# Patient Record
Sex: Female | Born: 1955 | Hispanic: Refuse to answer | Marital: Married | Smoking: Never smoker
Health system: Southern US, Community
[De-identification: ages and names within clinical notes are randomized; demographics above are authoritative.]

## PROBLEM LIST (undated history)

## (undated) DIAGNOSIS — R011 Cardiac murmur, unspecified: Secondary | ICD-10-CM

## (undated) DIAGNOSIS — J309 Allergic rhinitis, unspecified: Secondary | ICD-10-CM

## (undated) DIAGNOSIS — E669 Obesity, unspecified: Secondary | ICD-10-CM

## (undated) DIAGNOSIS — I10 Essential (primary) hypertension: Secondary | ICD-10-CM

## (undated) DIAGNOSIS — E785 Hyperlipidemia, unspecified: Secondary | ICD-10-CM

## (undated) DIAGNOSIS — M542 Cervicalgia: Secondary | ICD-10-CM

## (undated) DIAGNOSIS — E66811 Obesity, class 1: Secondary | ICD-10-CM

## (undated) DIAGNOSIS — E119 Type 2 diabetes mellitus without complications: Secondary | ICD-10-CM

## (undated) DIAGNOSIS — M26629 Arthralgia of temporomandibular joint, unspecified side: Secondary | ICD-10-CM

## (undated) DIAGNOSIS — M1990 Unspecified osteoarthritis, unspecified site: Secondary | ICD-10-CM

## (undated) DIAGNOSIS — G8929 Other chronic pain: Secondary | ICD-10-CM

## (undated) DIAGNOSIS — F419 Anxiety disorder, unspecified: Secondary | ICD-10-CM

## (undated) HISTORY — DX: Anxiety disorder, unspecified: F41.9

## (undated) HISTORY — PX: OTHER SURGICAL HISTORY: SHX169

## (undated) HISTORY — DX: Other chronic pain: G89.29

## (undated) HISTORY — DX: Obesity, unspecified: E66.9

## (undated) HISTORY — DX: Unspecified osteoarthritis, unspecified site: M19.90

## (undated) HISTORY — DX: Obesity, class 1: E66.811

## (undated) HISTORY — DX: Hyperlipidemia, unspecified: E78.5

## (undated) HISTORY — DX: Cardiac murmur, unspecified: R01.1

## (undated) HISTORY — DX: Cervicalgia: M54.2

## (undated) HISTORY — DX: Arthralgia of temporomandibular joint, unspecified side: M26.629

## (undated) HISTORY — DX: Type 2 diabetes mellitus without complications: E11.9

## (undated) HISTORY — PX: COLONOSCOPY: SHX174

## (undated) HISTORY — DX: Allergic rhinitis, unspecified: J30.9

---

## 1997-07-18 ENCOUNTER — Ambulatory Visit (HOSPITAL_COMMUNITY): Admission: RE | Admit: 1997-07-18 | Discharge: 1997-07-18 | Payer: Self-pay | Admitting: Infectious Diseases

## 1998-05-11 ENCOUNTER — Ambulatory Visit (HOSPITAL_COMMUNITY): Admission: RE | Admit: 1998-05-11 | Discharge: 1998-05-11 | Payer: Self-pay | Admitting: *Deleted

## 1998-05-13 ENCOUNTER — Encounter: Payer: Self-pay | Admitting: *Deleted

## 1998-08-20 ENCOUNTER — Ambulatory Visit (HOSPITAL_COMMUNITY): Admission: RE | Admit: 1998-08-20 | Discharge: 1998-08-20 | Payer: Self-pay | Admitting: *Deleted

## 1998-08-20 ENCOUNTER — Encounter: Payer: Self-pay | Admitting: *Deleted

## 1999-05-31 ENCOUNTER — Ambulatory Visit (HOSPITAL_COMMUNITY): Admission: RE | Admit: 1999-05-31 | Discharge: 1999-05-31 | Payer: Self-pay | Admitting: Orthopedic Surgery

## 1999-05-31 ENCOUNTER — Encounter: Payer: Self-pay | Admitting: Orthopedic Surgery

## 1999-07-05 ENCOUNTER — Encounter: Payer: Self-pay | Admitting: *Deleted

## 1999-07-05 ENCOUNTER — Ambulatory Visit (HOSPITAL_COMMUNITY): Admission: RE | Admit: 1999-07-05 | Discharge: 1999-07-05 | Payer: Self-pay

## 2000-01-06 ENCOUNTER — Encounter: Admission: RE | Admit: 2000-01-06 | Discharge: 2000-02-09 | Payer: Self-pay | Admitting: Orthopedic Surgery

## 2000-11-06 ENCOUNTER — Ambulatory Visit (HOSPITAL_COMMUNITY): Admission: RE | Admit: 2000-11-06 | Discharge: 2000-11-06 | Payer: Self-pay | Admitting: Diagnostic Radiology

## 2001-05-22 ENCOUNTER — Encounter: Payer: Self-pay | Admitting: Neurosurgery

## 2001-05-22 ENCOUNTER — Ambulatory Visit (HOSPITAL_COMMUNITY): Admission: RE | Admit: 2001-05-22 | Discharge: 2001-05-22 | Payer: Self-pay | Admitting: Neurosurgery

## 2002-01-15 ENCOUNTER — Encounter: Payer: Self-pay | Admitting: Internal Medicine

## 2002-01-15 ENCOUNTER — Encounter: Admission: RE | Admit: 2002-01-15 | Discharge: 2002-01-15 | Payer: Self-pay | Admitting: Internal Medicine

## 2003-02-20 ENCOUNTER — Ambulatory Visit (HOSPITAL_COMMUNITY): Admission: RE | Admit: 2003-02-20 | Discharge: 2003-02-20 | Payer: Self-pay | Admitting: Orthopedic Surgery

## 2003-06-24 ENCOUNTER — Emergency Department (HOSPITAL_COMMUNITY): Admission: EM | Admit: 2003-06-24 | Discharge: 2003-06-24 | Payer: Self-pay | Admitting: Family Medicine

## 2003-07-24 ENCOUNTER — Encounter: Admission: RE | Admit: 2003-07-24 | Discharge: 2003-10-22 | Payer: Self-pay | Admitting: Orthopedic Surgery

## 2003-09-24 ENCOUNTER — Ambulatory Visit (HOSPITAL_COMMUNITY): Admission: RE | Admit: 2003-09-24 | Discharge: 2003-09-24 | Payer: Self-pay | Admitting: Orthopedic Surgery

## 2004-03-26 ENCOUNTER — Ambulatory Visit (HOSPITAL_COMMUNITY): Admission: RE | Admit: 2004-03-26 | Discharge: 2004-03-26 | Payer: Self-pay | Admitting: Allergy

## 2004-06-02 ENCOUNTER — Ambulatory Visit (HOSPITAL_COMMUNITY): Admission: RE | Admit: 2004-06-02 | Discharge: 2004-06-02 | Payer: Self-pay | Admitting: Internal Medicine

## 2004-12-03 ENCOUNTER — Ambulatory Visit (HOSPITAL_COMMUNITY): Admission: RE | Admit: 2004-12-03 | Discharge: 2004-12-03 | Payer: Self-pay | Admitting: Interventional Radiology

## 2005-01-28 ENCOUNTER — Ambulatory Visit (HOSPITAL_COMMUNITY): Admission: RE | Admit: 2005-01-28 | Discharge: 2005-01-28 | Payer: Self-pay | Admitting: Cardiovascular Disease

## 2005-03-30 ENCOUNTER — Ambulatory Visit (HOSPITAL_COMMUNITY): Admission: RE | Admit: 2005-03-30 | Discharge: 2005-03-30 | Payer: Self-pay | Admitting: Orthopedic Surgery

## 2005-07-11 ENCOUNTER — Emergency Department (HOSPITAL_COMMUNITY): Admission: EM | Admit: 2005-07-11 | Discharge: 2005-07-11 | Payer: Self-pay | Admitting: Family Medicine

## 2006-01-26 ENCOUNTER — Ambulatory Visit (HOSPITAL_COMMUNITY): Admission: RE | Admit: 2006-01-26 | Discharge: 2006-01-26 | Payer: Self-pay | Admitting: Cardiovascular Disease

## 2006-08-16 ENCOUNTER — Ambulatory Visit (HOSPITAL_COMMUNITY): Admission: RE | Admit: 2006-08-16 | Discharge: 2006-08-16 | Payer: Self-pay | Admitting: Internal Medicine

## 2006-10-12 ENCOUNTER — Ambulatory Visit (HOSPITAL_COMMUNITY): Admission: RE | Admit: 2006-10-12 | Discharge: 2006-10-12 | Payer: Self-pay | Admitting: Internal Medicine

## 2009-04-10 ENCOUNTER — Ambulatory Visit (HOSPITAL_COMMUNITY): Admission: RE | Admit: 2009-04-10 | Discharge: 2009-04-10 | Payer: Self-pay | Admitting: Internal Medicine

## 2012-08-13 ENCOUNTER — Other Ambulatory Visit (HOSPITAL_COMMUNITY): Payer: Self-pay | Admitting: Internal Medicine

## 2012-08-13 DIAGNOSIS — Z1231 Encounter for screening mammogram for malignant neoplasm of breast: Secondary | ICD-10-CM

## 2012-08-14 ENCOUNTER — Ambulatory Visit (HOSPITAL_COMMUNITY)
Admission: RE | Admit: 2012-08-14 | Discharge: 2012-08-14 | Disposition: A | Discharge status: Home / Self Care | Payer: 59 | Source: Ambulatory Visit | Attending: Internal Medicine | Admitting: Internal Medicine

## 2012-08-14 DIAGNOSIS — Z1231 Encounter for screening mammogram for malignant neoplasm of breast: Secondary | ICD-10-CM | POA: Insufficient documentation

## 2012-11-14 ENCOUNTER — Encounter (HOSPITAL_COMMUNITY): Payer: Self-pay | Admitting: Emergency Medicine

## 2012-11-14 ENCOUNTER — Emergency Department (HOSPITAL_COMMUNITY)
Admission: EM | Admit: 2012-11-14 | Discharge: 2012-11-14 | Disposition: A | Discharge status: Home / Self Care | Payer: Worker's Compensation | Attending: Emergency Medicine | Admitting: Emergency Medicine

## 2012-11-14 DIAGNOSIS — Z79899 Other long term (current) drug therapy: Secondary | ICD-10-CM | POA: Insufficient documentation

## 2012-11-14 DIAGNOSIS — Y9389 Activity, other specified: Secondary | ICD-10-CM | POA: Insufficient documentation

## 2012-11-14 DIAGNOSIS — I10 Essential (primary) hypertension: Secondary | ICD-10-CM | POA: Insufficient documentation

## 2012-11-14 DIAGNOSIS — Y929 Unspecified place or not applicable: Secondary | ICD-10-CM | POA: Insufficient documentation

## 2012-11-14 DIAGNOSIS — S90569A Insect bite (nonvenomous), unspecified ankle, initial encounter: Secondary | ICD-10-CM | POA: Insufficient documentation

## 2012-11-14 DIAGNOSIS — S90562A Insect bite (nonvenomous), left ankle, initial encounter: Secondary | ICD-10-CM

## 2012-11-14 DIAGNOSIS — W57XXXA Bitten or stung by nonvenomous insect and other nonvenomous arthropods, initial encounter: Secondary | ICD-10-CM

## 2012-11-14 HISTORY — DX: Essential (primary) hypertension: I10

## 2012-11-14 NOTE — ED Provider Notes (Signed)
This chart was scribed for Cornelia Copa, a non-physician practitioner working with Junius Argyle, MD by Lewanda Rife, ED Scribe. This patient was seen in room WTR5/WTR5 and the patient's care was started at 2326.    CSN: [id number]     Arrival date & time 11/14/12  2135 History   First MD Initiated Contact with Patient 11/14/12 2228     Chief Complaint  Patient presents with  . Insect Bite    The history is provided by the patient.   HPI Comments: Dana Mcmahon is a 57 y.o. female who presents to the Emergency Department complaining of possible insect bite over left medial ankle onset yesterday 1530. Describes pain as improving and mildly pruritic. 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 Reports associated moderate pain localized to site and resolved swelling. Denies associated numbness, poison oak or poison ivy exposure, fever, chills, redness, swelling, and abdominal pain. Denies any aggravating factors. Reports symptoms are relieved with leg elevation. Reports trying hand sanitizer with mild relief of symptoms.     Past Medical History  Diagnosis Date  . Hypertension    History reviewed. No pertinent past surgical history. No family history on file. History  Substance Use Topics  . Smoking status: Never Smoker   . Smokeless tobacco: Not on file  . Alcohol Use: No   OB History   Grav Para Term Preterm Abortions TAB SAB Ect Mult Living                 Review of Systems  Constitutional: Negative for fever.   A complete 10 system review of systems was obtained and all systems are negative except as noted in the HPI and PMH.    Allergies  Review of patient's allergies indicates no known allergies.  Home Medications   Current Outpatient Rx  Name  Route  Sig  Dispense  Refill  . levocetirizine (XYZAL) 5 MG tablet   Oral   Take 5 mg by mouth every evening.         Marland Kitchen lisinopril (PRINIVIL,ZESTRIL) 5 MG tablet   Oral   Take 5 mg by mouth daily.          . Multiple Vitamin (MULTIVITAMIN WITH MINERALS) TABS tablet   Oral   Take 1 tablet by mouth daily.          BP 161/90  Pulse 75  Temp(Src) 98.9 F (37.2 C) (Oral)  Resp 15  Ht 5\' 4"  (1.626 m)  Wt 175 lb (79.379 kg)  BMI 30.02 kg/m2  SpO2 98% Physical Exam  Nursing note and vitals reviewed. Constitutional: She is oriented to person, place, and time. She appears well-developed and well-nourished. No distress.  HENT:  Head: Normocephalic and atraumatic.  Eyes: Conjunctivae and EOM are normal.  Neck: Neck supple. No tracheal deviation present.  Cardiovascular: Normal rate and regular rhythm.   No murmur heard. Pulmonary/Chest: Effort normal and breath sounds normal. No respiratory distress. She has no wheezes.  Abdominal: Soft.  Musculoskeletal: Normal range of motion.  Neurological: She is alert and oriented to person, place, and time.  Skin: Skin is warm, dry and intact. Rash noted. Rash is vesicular.  1 small bullae lesion. No surrounding erythema, or induration. No other similar lesions noted.   Psychiatric: She has a normal mood and affect. Her behavior is normal.    ED Course  Procedures       MDM   1. Insect bite of ankle, left, initial  encounter    Patient seen and evaluated. She reports acute onset pinching sensation to the medial aspect of the left ankle and foot area. She did develop one single small bulla. She also reports having larger area of redness and mild swelling earlier today which has improved. The area continues to be pruritic. History is consistent with a possible insect bite. There are no other lesions to suggest shingles. No signs for a secondary cellulitis.    I personally performed the services described in this documentation, which was scribed in my presence. The recorded information has been reviewed and is accurate.   Angus Seller, PA-C 11/15/12 0011

## 2012-11-14 NOTE — ED Notes (Signed)
Pt c/o swelling, itching to L ankle. Pt states she was bitten by unkn insect yesterday while working at [employer], small blister noted.

## 2012-11-15 NOTE — ED Provider Notes (Signed)
Medical screening examination/treatment/procedure(s) were performed by non-physician practitioner and as supervising physician I was immediately available for consultation/collaboration.   Junius Argyle, MD 11/15/12 1118

## 2013-05-10 ENCOUNTER — Ambulatory Visit (INDEPENDENT_AMBULATORY_CARE_PROVIDER_SITE_OTHER): Payer: Federal, State, Local not specified - PPO | Admitting: Family Medicine

## 2013-05-10 ENCOUNTER — Encounter: Payer: Self-pay | Admitting: Family Medicine

## 2013-05-10 VITALS — BP 145/85 | HR 68 | Temp 98.1°F | Resp 18 | Ht 63.0 in | Wt 177.0 lb

## 2013-05-10 DIAGNOSIS — E119 Type 2 diabetes mellitus without complications: Secondary | ICD-10-CM | POA: Insufficient documentation

## 2013-05-10 DIAGNOSIS — E1165 Type 2 diabetes mellitus with hyperglycemia: Principal | ICD-10-CM

## 2013-05-10 DIAGNOSIS — R002 Palpitations: Secondary | ICD-10-CM | POA: Insufficient documentation

## 2013-05-10 DIAGNOSIS — E1159 Type 2 diabetes mellitus with other circulatory complications: Secondary | ICD-10-CM | POA: Insufficient documentation

## 2013-05-10 DIAGNOSIS — I152 Hypertension secondary to endocrine disorders: Secondary | ICD-10-CM | POA: Insufficient documentation

## 2013-05-10 DIAGNOSIS — I10 Essential (primary) hypertension: Secondary | ICD-10-CM | POA: Insufficient documentation

## 2013-05-10 DIAGNOSIS — IMO0001 Reserved for inherently not codable concepts without codable children: Secondary | ICD-10-CM

## 2013-05-10 MED ORDER — ASPIRIN EC 81 MG PO TBEC: 81.0000 mg | DELAYED_RELEASE_TABLET | Freq: Every day | ORAL | Status: DC

## 2013-05-10 NOTE — Assessment & Plan Note (Signed)
Pt resistant to meds, esp since bad experience with metformin recently. At this time she prefers to resume her regular exercise, continue diabetic diet, did teaching on home glucometer use, she'll call with her insurances info about preferred glucometer/supplies and we'll rx what is appropriate.  Check fasting glucose daily and bring numbers for review in 2wks. Start daily ASA 81mg  qd.

## 2013-05-10 NOTE — Assessment & Plan Note (Signed)
The current medical regimen is effective;  continue present plan and medications.  

## 2013-05-10 NOTE — Assessment & Plan Note (Signed)
W/u neg for dysrhythmia in the past per pt report. Will obtain old records. Reassured pt of likely benign nature of these, likely related to anxiety. Recommended she continue to minimize caffeine intake and avoid use of OTC decongestants.

## 2013-05-10 NOTE — Progress Notes (Signed)
Pre visit review using our clinic review tool, if applicable. No additional management support is needed unless otherwise documented below in the visit note. 

## 2013-05-10 NOTE — Progress Notes (Signed)
Office Note 05/10/2013  CC:  Chief Complaint  Patient presents with  . Establish Care    transfer from Dr. Karlton Lemon    HPI:  Dana Mcmahon is a 58 y.o. Asian female who is here to establish care. Patient's most recent primary MD: Dr. Karlton Lemon ([facility] in [place]). Old records in EPIC/[facility] EMR were reviewed prior to or during today's visit.  Dx'd with DM approx 2012.  Pt intially controlled it with diet but says last A1c was up and MD started metformin. Started metformin last month, stopped this med about 2 wks ago b/c stomach ache, cognitive changes, some muscle pains.  All sx's resolved with cessation of this med. She does not have any way to monitor her glucoses at home.  She describes herself as chronic worrier, stressed with her work. Describes several year history of brief palpitations that cause her SOB and severe anxiety, last about 1 min, no known trigger (occur at rest only), without CP/diaphoresis/dizziness.  She reports having had a normal EKG and normal holter monitor testing approx 2-3 yrs ago for these sx's.  Says the episodes occur about 1-2 times per month.  Past Medical History  Diagnosis Date  . Hypertension   . Allergic rhinitis   . 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 Neck pain, chronic     Mild DDD, no signif progression (multiple MRI's: 2001, 2003, 2005, 2007)  . DM type 2 (diabetes mellitus, type 2)     History reviewed. No pertinent past surgical history.  Family History  Problem Relation Age of Onset  . Stroke Mother     and father  . Hypertension Mother     and father    History   Social History  . Marital Status: Married    Spouse Name: N/A    Number of Children: N/A  . Years of Education: N/A   Occupational History  . Not on file.   Social History Main Topics  . Smoking status: Never Smoker   . Smokeless tobacco: Not on file  . Alcohol Use: No  . Drug Use: No  . Sexual Activity: Not on file   Other Topics Concern  . Not on file    Social History Narrative   Married, 4 children.   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   No T/A/Ds.                Outpatient Encounter Prescriptions as of 05/10/2013  Medication Sig  . fluticasone (FLONASE) 50 MCG/ACT nasal spray Place 2 sprays into both nostrils daily.  Marland Kitchen lisinopril (PRINIVIL,ZESTRIL) 5 MG tablet Take 5 mg by mouth daily.  . Multiple Vitamin (MULTIVITAMIN WITH MINERALS) TABS tablet Take 1 tablet by mouth daily.  Marland Kitchen aspirin EC 81 MG tablet Take 1 tablet (81 mg total) by mouth daily.  Marland Kitchen levocetirizine (XYZAL) 5 MG tablet Take 5 mg by mouth every evening.  Not taking levocetirizine currently Takes flonase daily  No Known Allergies  ROS See above PE; Blood pressure 145/85, pulse 68, temperature 98.1 F (36.7 C), temperature source Temporal, resp. rate 18, height 5\' 3"  (1.6 m), weight 177 lb (80.287 kg), SpO2 95.00%. Gen: Alert, well appearing, overweight-appearing.  Patient is oriented to person, place, time, and situation. HYQ:MVHQ: no injection, icteris, swelling, or exudate.  EOMI, PERRLA. Mouth: lips without lesion/swelling.  Oral mucosa pink and moist. Oropharynx without erythema, exudate, or swelling.  Neck - No masses or thyromegaly or limitation  in range of motion CV: RRR, no m/r/g.   LUNGS: CTA bilat, nonlabored resps, good aeration in all lung fields. EXT: no clubbing, cyanosis, or edema.   Pertinent labs:  CBG today: 136 (fasting)  ASSESSMENT AND PLAN:   New pt; obtain old records.  Type II or unspecified type diabetes mellitus without mention of complication, uncontrolled Pt resistant to meds, esp since bad experience with metformin recently. At this time she prefers to resume her regular exercise, continue diabetic diet, did teaching on home glucometer use, she'll call with her insurances info about preferred glucometer/supplies and we'll rx what is appropriate.  Check fasting glucose daily and bring numbers for review in  2wks. Start daily ASA 81mg  qd.   Palpitations W/u neg for dysrhythmia in the past per pt report. Will obtain old records. Reassured pt of likely benign nature of these, likely related to anxiety. Recommended she continue to minimize caffeine intake and avoid use of OTC decongestants.  HTN (hypertension), benign The current medical regimen is effective;  continue present plan and medications.   An After Visit Summary was printed and given to the patient.  Return in about 2 weeks (around 05/24/2013) for f/u DM 2 and discuss chronic anxiety.

## 2013-05-10 NOTE — Patient Instructions (Signed)
Start 81mg  Aspirin tab once daily. Call your insurer and ask what brand of glucometer and glucose test strips they prefer. Call our office with this information and I'll send rx for the glucometer and testing supplies to your pharmacy. Check your glucose once every morning before eating or drinking anything--write number down and bring list in for review at next office visit in approximately 2 wks.

## 2013-05-13 ENCOUNTER — Telehealth: Payer: Self-pay | Admitting: Family Medicine

## 2013-05-13 NOTE — Telephone Encounter (Signed)
Relevant patient education assigned to patient using Emmi. ° °

## 2013-05-20 ENCOUNTER — Encounter: Payer: Self-pay | Admitting: Family Medicine

## 2013-05-21 ENCOUNTER — Telehealth: Payer: Self-pay

## 2013-05-21 NOTE — Telephone Encounter (Signed)
Relevant patient education assigned to patient using Emmi. ° °

## 2013-05-24 ENCOUNTER — Ambulatory Visit: Payer: Federal, State, Local not specified - PPO | Admitting: Family Medicine

## 2013-05-31 ENCOUNTER — Encounter: Payer: Self-pay | Admitting: Family Medicine

## 2013-05-31 ENCOUNTER — Ambulatory Visit (INDEPENDENT_AMBULATORY_CARE_PROVIDER_SITE_OTHER): Payer: Federal, State, Local not specified - PPO | Admitting: Family Medicine

## 2013-05-31 VITALS — BP 143/85 | HR 77 | Temp 97.7°F | Resp 18 | Ht 63.0 in | Wt 180.0 lb

## 2013-05-31 DIAGNOSIS — Z566 Other physical and mental strain related to work: Secondary | ICD-10-CM

## 2013-05-31 DIAGNOSIS — R079 Chest pain, unspecified: Secondary | ICD-10-CM

## 2013-05-31 DIAGNOSIS — Z569 Unspecified problems related to employment: Secondary | ICD-10-CM

## 2013-05-31 DIAGNOSIS — I10 Essential (primary) hypertension: Secondary | ICD-10-CM

## 2013-05-31 DIAGNOSIS — M79609 Pain in unspecified limb: Secondary | ICD-10-CM

## 2013-05-31 DIAGNOSIS — E119 Type 2 diabetes mellitus without complications: Secondary | ICD-10-CM

## 2013-05-31 DIAGNOSIS — E785 Hyperlipidemia, unspecified: Secondary | ICD-10-CM

## 2013-05-31 DIAGNOSIS — M79646 Pain in unspecified finger(s): Secondary | ICD-10-CM

## 2013-05-31 LAB — BASIC METABOLIC PANEL
BUN: 17 mg/dL (ref 6–23)
CO2: 30 mEq/L (ref 19–32)
Calcium: 9.4 mg/dL (ref 8.4–10.5)
Chloride: 99 mEq/L (ref 96–112)
Creatinine, Ser: 0.6 mg/dL (ref 0.4–1.2)
GFR: 106.93 mL/min (ref >60.00)
Glucose, Bld: 149 mg/dL — ABNORMAL HIGH (ref 70–99)
Potassium: 3.6 mEq/L (ref 3.5–5.1)
Sodium: 136 mEq/L (ref 135–145)

## 2013-05-31 LAB — HEMOGLOBIN A1C: Hgb A1c MFr Bld: 7.7 % — ABNORMAL HIGH (ref 4.6–6.5)

## 2013-05-31 NOTE — Progress Notes (Signed)
OFFICE NOTE  05/31/2013  CC:  Chief Complaint  Patient presents with  . Follow-up    2 weeks     HPI: Patient is a 58 y.o. Hispanic female who is here for 2 wk f/u: dm 2. No home glucoses to report; just got her glucometer yesterday.  Also wants to discuss stress: mainly due to her work, feels overwhelmed with having to do so much.  Minimizes and other worries in her life.  Does not feel like this is a big issue she needs to deal with.  Denies depressed mood.  Still c/o intermittent (every few days at the most lately), central and right sided chest soreness that comes on as a slow squeezing sensation, always occurs at rest and always goes away w/in a few minutes with some slow/deep breaths and with "getting my mind clear".  No diaphoresis, nausea, or palpitations with this.  No exertional sx's. Sometimes the pain seems to wax and wane throughout her day and she takes ibup or tylenol.   Pertinent PMH:  Past medical, surgical, social, and family history reviewed and no changes are noted since last office visit.  MEDS:  Outpatient Prescriptions Prior to Visit  Medication Sig Dispense Refill  . aspirin EC 81 MG tablet Take 1 tablet (81 mg total) by mouth daily.  30 tablet  0  . fluticasone (FLONASE) 50 MCG/ACT nasal spray Place 2 sprays into both nostrils daily.      Marland Kitchen lisinopril (PRINIVIL,ZESTRIL) 5 MG tablet Take 5 mg by mouth daily.      . Multiple Vitamin (MULTIVITAMIN WITH MINERALS) TABS tablet Take 1 tablet by mouth daily.      Marland Kitchen levocetirizine (XYZAL) 5 MG tablet Take 5 mg by mouth every evening.       No facility-administered medications prior to visit.    PE: Blood pressure 143/85, pulse 77, temperature 97.7 F (36.5 C), temperature source Oral, resp. rate 18, height 5\' 3"  (1.6 m), weight 180 lb (81.647 kg), SpO2 94.00%. CV: RRR, no m/r/g.   LUNGS: CTA bilat, nonlabored resps, good aeration in all lung fields. Right upper chest wall and central chest wall tenderness to  palpation--mild.   IMPRESSION AND PLAN:  1) DM 2, due for HbA1c check today. Will discuss possible retry of oral med after results are in. She has never seen a nutritionist: referral made today.  2) HTN: The current medical regimen is effective;  continue present plan and medications. Cr/lytes today.  3) Chest wall, non-cardiac chest pain.  Possibly exacerbated by work-stress. Reassured pt.  May take ibuprofen or tylenol prn. Signs/symptoms to call or return for were reviewed and pt expressed understanding. EKGs in the past normal.  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  Letter requested by pt to allow for ergonomically correct work equipment--letter written today.  5) Hyperlipidemia (most recent labs 01/2013 at prev PMD): pt declines meds despite my recommendation of this today.  Discussed appropriate TLC. May recheck chol panel at next f/u in 4 mo if fasting.   An After Visit Summary was printed and given to the patient.  FOLLOW UP: 4 mo

## 2013-05-31 NOTE — Progress Notes (Signed)
Pre visit review using our clinic review tool, if applicable. No additional management support is needed unless otherwise documented below in the visit note. 

## 2013-06-05 ENCOUNTER — Other Ambulatory Visit: Payer: Self-pay | Admitting: Family Medicine

## 2013-06-05 MED ORDER — PIOGLITAZONE HCL 15 MG PO TABS: 15.0000 mg | ORAL_TABLET | Freq: Every day | ORAL | Status: DC

## 2013-06-14 ENCOUNTER — Telehealth: Payer: Self-pay | Admitting: Family Medicine

## 2013-06-14 NOTE — Telephone Encounter (Signed)
Refill request for patanol 0.1% opth soln 38mL but I do not see this on her med list.  Please advise.

## 2013-06-16 ENCOUNTER — Other Ambulatory Visit: Payer: Self-pay | Admitting: Family Medicine

## 2013-06-16 MED ORDER — OLOPATADINE HCL 0.1 % OP SOLN: OPHTHALMIC | Status: DC

## 2013-06-16 NOTE — Telephone Encounter (Signed)
Patanol rx sent to pharmacy.

## 2013-07-30 ENCOUNTER — Ambulatory Visit: Payer: Self-pay | Admitting: Dietician

## 2013-08-08 ENCOUNTER — Encounter: Payer: Self-pay | Admitting: *Deleted

## 2013-08-08 ENCOUNTER — Ambulatory Visit (INDEPENDENT_AMBULATORY_CARE_PROVIDER_SITE_OTHER): Payer: Federal, State, Local not specified - PPO | Admitting: Nurse Practitioner

## 2013-08-08 ENCOUNTER — Encounter: Payer: Self-pay | Admitting: Nurse Practitioner

## 2013-08-08 ENCOUNTER — Telehealth: Payer: Self-pay

## 2013-08-08 VITALS — BP 126/75 | HR 72 | Temp 98.4°F | Ht 63.0 in | Wt 176.0 lb

## 2013-08-08 DIAGNOSIS — E119 Type 2 diabetes mellitus without complications: Secondary | ICD-10-CM

## 2013-08-08 DIAGNOSIS — R42 Dizziness and giddiness: Secondary | ICD-10-CM | POA: Insufficient documentation

## 2013-08-08 DIAGNOSIS — J0110 Acute frontal sinusitis, unspecified: Secondary | ICD-10-CM | POA: Insufficient documentation

## 2013-08-08 MED ORDER — AMOXICILLIN-POT CLAVULANATE 875-125 MG PO TABS: 1.0000 | ORAL_TABLET | Freq: Two times a day (BID) | ORAL | Status: DC

## 2013-08-08 MED ORDER — METFORMIN HCL 500 MG PO TABS: 500.0000 mg | ORAL_TABLET | Freq: Every day | ORAL | Status: DC

## 2013-08-08 MED ORDER — FLUTICASONE PROPIONATE 50 MCG/ACT NA SUSP: 2.0000 | Freq: Every day | NASAL | Status: DC

## 2013-08-08 NOTE — Telephone Encounter (Signed)
Pt called to make an appt. She will come in today to see Layne at 3:00.

## 2013-08-08 NOTE — Patient Instructions (Signed)
Start metformin, let us know if you develop joint pain or stop medicine for another reason.  Try to walk for 15 minutes after largest meals of day. Cut out sugar and refined flour. Limit calories from sugar to 100 calories daily. Eat lots of fruits and vegetables.  Start antibiotic. Use flonase.  Eat yogurt daily at lunch or afternoon to help prevent diarrhea that can be caused by antibiotic. Start daily sinus rinses (Neilmed Sinus rinse) for at least 5-7 days.  Please call for re-evaluation if you are not improving.   Sinusitis Sinusitis is redness, soreness, and swelling (inflammation) of the paranasal sinuses. Paranasal sinuses are air pockets within the bones of your face (beneath the eyes, the middle of the forehead, or above the eyes). In healthy paranasal sinuses, mucus is able to drain out, and air is able to circulate through them by way of your nose. However, when your paranasal sinuses are inflamed, mucus and air can become trapped. This can allow bacteria and other germs to grow and cause infection. Sinusitis can develop quickly and last only a short time (acute) or continue over a long period (chronic). Sinusitis that lasts for more than 12 weeks is considered chronic.  CAUSES  Causes of sinusitis include:  Allergies.  Structural abnormalities, such as displacement of the cartilage that separates your nostrils (deviated septum), which can decrease the air flow through your nose and sinuses and affect sinus drainage.  Functional abnormalities, such as when the small hairs (cilia) that line your sinuses and help remove mucus do not work properly or are not present. SYMPTOMS  Symptoms of acute and chronic sinusitis are the same. The primary symptoms are pain and pressure around the affected sinuses. Other symptoms include:  Upper toothache.  Earache.  Headache.  Bad breath.  Decreased sense of smell and taste.  A cough, which worsens when you are lying  flat.  Fatigue.  Fever.  Thick drainage from your nose, which often is green and may contain pus (purulent).  Swelling and warmth over the affected sinuses. DIAGNOSIS  Your caregiver will perform a physical exam. During the exam, your caregiver may:  Look in your nose for signs of abnormal growths in your nostrils (nasal polyps).  Tap over the affected sinus to check for signs of infection.  View the inside of your sinuses (endoscopy) with a special imaging device with a light attached (endoscope), which is inserted into your sinuses. If your caregiver suspects that you have chronic sinusitis, one or more of the following tests may be recommended:  Allergy tests.  Nasal culture A sample of mucus is taken from your nose and sent to a lab and screened for bacteria.  Nasal cytology A sample of mucus is taken from your nose and examined by your caregiver to determine if your sinusitis is related to an allergy. TREATMENT  Most cases of acute sinusitis are related to a viral infection and will resolve on their own within 10 days. Sometimes medicines are prescribed to help relieve symptoms (pain medicine, decongestants, nasal steroid sprays, or saline sprays).  However, for sinusitis related to a bacterial infection, your caregiver will prescribe antibiotic medicines. These are medicines that will help kill the bacteria causing the infection.  Rarely, sinusitis is caused by a fungal infection. In theses cases, your caregiver will prescribe antifungal medicine. For some cases of chronic sinusitis, surgery is needed. Generally, these are cases in which sinusitis recurs more than 3 times per year, despite other treatments. HOME CARE  INSTRUCTIONS   Drink plenty of water. Water helps thin the mucus so your sinuses can drain more easily.  Use a humidifier.  Inhale steam 3 to 4 times a day (for example, sit in the bathroom with the shower running).  Apply a warm, moist washcloth to your face 3  to 4 times a day, or as directed by your caregiver.  Use saline nasal sprays to help moisten and clean your sinuses.  Take over-the-counter or prescription medicines for pain, discomfort, or fever only as directed by your caregiver. SEEK IMMEDIATE MEDICAL CARE IF:  You have increasing pain or severe headaches.  You have nausea, vomiting, or drowsiness.  You have swelling around your face.  You have vision problems.  You have a stiff neck.  You have difficulty breathing. MAKE SURE YOU:   Understand these instructions.  Will watch your condition.  Will get help right away if you are not doing well or get worse. Document Released: 02/07/2005 Document Revised: 05/02/2011 Document Reviewed: 02/22/2011 [facility] Patient Information 2014 [place], [place].

## 2013-08-08 NOTE — Progress Notes (Signed)
Pre visit review using our clinic review tool, if applicable. No additional management support is needed unless otherwise documented below in the visit note. 

## 2013-08-08 NOTE — Progress Notes (Signed)
Subjective:     Dana Mcmahon is a 58 y.o. female who presents c/o of intermittent episodes of dizziness, L sided HA, weak, palpitations, nausea. She denies fever, vision change, tremors, local weakness. Episodes have been occurring for about 2 mos. They last all day. OTC meds relieve HA pain. She has used benadryl with no relief, ran out of flonase, but says it usually helps with sinus pressure.  She was last seen in office 3 mos ago. She c/o palpitations then. ECG reviewed dated 12/14: NSR, incomplete RBB. Her A1C was elevated 7.7. She was prescribed actos, but did not start medication. She does not want to take it because she heard there is a "lawsuit" with it. She has started exercising, but has not been able in last few weeks due to feeling bad. She does not check blood sugars. She took metformin in past, but stopped due to diarrhea & joint pain. She wants to try it again.  The following portions of the patient's history were reviewed and updated as appropriate: allergies, current medications, past medical history, past social history, past surgical history and problem list.  Review of Systems Pertinent items are noted in HPI.    Objective:    BP 126/75  Pulse 72  Temp(Src) 98.4 F (36.9 C) (Temporal)  Ht 5\' 3"  (1.6 m)  Wt 176 lb (79.833 kg)  BMI 31.18 kg/m2  SpO2 94% BP 126/75  Pulse 72  Temp(Src) 98.4 F (36.9 C) (Temporal)  Ht 5\' 3"  (1.6 m)  Wt 176 lb (79.833 kg)  BMI 31.18 kg/m2  SpO2 94% General appearance: alert, cooperative, appears stated age, mild distress and tearful with position change due to nausea. Head: Normocephalic, without obvious abnormality, atraumatic Eyes: negative findings: lids and lashes normal, conjunctivae and sclerae normal, corneas clear and pupils equal, round, reactive to light and accomodation Ears: bubbles, clear fluid bilat TM, bones visible Nose: Nares normal. Septum midline. Mucosa normal. No drainage or sinus tenderness. Throat: lips, mucosa,  and tongue normal; teeth and gums normal Lungs: clear to auscultation bilaterally Heart: regular rate and rhythm, S1, S2 normal, no murmur, click, rub or gallop Extremities: extremities normal, atraumatic, no cyanosis or edema Pulses: 2+ and symmetric Neurologic: Grossly normal   Dix-Halllpike neg for nystagmus Lying flat makes head hurt worse. Assessment:   1. Type 2 diabetes mellitus without complication - metFORMIN (GLUCOPHAGE) 500 MG tablet; Take 1 tablet (500 mg total) by mouth daily. Take with largest meal.  Dispense: 30 tablet; Refill: 1  2. Acute frontal sinusitis, recurrence not specified - fluticasone (FLONASE) 50 MCG/ACT nasal spray; Place 2 sprays into both nostrils daily.  Dispense: 1 g; Refill: 5 - amoxicillin-clavulanate (AUGMENTIN) 875-125 MG per tablet; Take 1 tablet by mouth 2 (two) times daily.  Dispense: 10 tablet; Refill: 0  3. Dizzy spells F/u 10 days

## 2013-08-22 ENCOUNTER — Ambulatory Visit: Payer: Federal, State, Local not specified - PPO | Admitting: Family Medicine

## 2013-08-22 ENCOUNTER — Ambulatory Visit (INDEPENDENT_AMBULATORY_CARE_PROVIDER_SITE_OTHER): Payer: Federal, State, Local not specified - PPO | Admitting: Family Medicine

## 2013-08-22 ENCOUNTER — Encounter: Payer: Self-pay | Admitting: Family Medicine

## 2013-08-22 VITALS — BP 145/79 | HR 82 | Temp 98.3°F | Resp 18 | Ht 63.0 in | Wt 179.0 lb

## 2013-08-22 DIAGNOSIS — IMO0001 Reserved for inherently not codable concepts without codable children: Secondary | ICD-10-CM

## 2013-08-22 DIAGNOSIS — F411 Generalized anxiety disorder: Secondary | ICD-10-CM

## 2013-08-22 DIAGNOSIS — E1165 Type 2 diabetes mellitus with hyperglycemia: Secondary | ICD-10-CM

## 2013-08-22 DIAGNOSIS — F410 Panic disorder [episodic paroxysmal anxiety] without agoraphobia: Secondary | ICD-10-CM

## 2013-08-22 MED ORDER — CITALOPRAM HYDROBROMIDE 20 MG PO TABS: 20.0000 mg | ORAL_TABLET | Freq: Every day | ORAL | Status: DC

## 2013-08-22 NOTE — Progress Notes (Signed)
Pre visit review using our clinic review tool, if applicable. No additional management support is needed unless otherwise documented below in the visit note. 

## 2013-08-22 NOTE — Progress Notes (Signed)
OFFICE NOTE  08/22/2013  CC:  Chief Complaint  Patient presents with  . Follow-up    dizziness,palpitations.   HPI: Patient is a 58 y.o.  female who is here for f/u DM 2 and hx of recurrent episodes of dizziness/lightheadedness and palpitations.  She says these episodes last a whole day.  Very fatigued.  No trigger or pattern can be detected EXCEPT for stress/anxiety.  She rarely drinks caffeine now.   She has these less on her days off. +Snoring but no known apneic events. She has chronic rhinitis with PND, no better with recent course of augmentin.  Takes flonase daily and prn benadryl.  Ears itch. No popping or fullness in ears. Holter monitor in the last couple of years was done for these sx's and it was normal. Lab w/u and EKGs for these sx's in the past has been normal. No depression.  Started back on metformin 500mg  qd and she is tolerating this fine.   Pertinent PMH:  Past Medical History  Diagnosis Date  . Hypertension   . Allergic rhinitis   . 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 Neck pain, chronic     Mild DDD, no signif progression (multiple MRI's: 2001, 2003, 2005, 2007)  . DM type 2 (diabetes mellitus, type 2)   . Hyperlipidemia     per old records, pt has refused cholesto-lowering meds  . Obesity, Class I, BMI 30-34.9     MEDS:  Outpatient Prescriptions Prior to Visit  Medication Sig Dispense Refill  . aspirin EC 81 MG tablet Take 1 tablet (81 mg total) by mouth daily.  30 tablet  0  . fluticasone (FLONASE) 50 MCG/ACT nasal spray Place 2 sprays into both nostrils daily.  1 g  5  . lisinopril (PRINIVIL,ZESTRIL) 5 MG tablet Take 5 mg by mouth daily.      . metFORMIN (GLUCOPHAGE) 500 MG tablet Take 1 tablet (500 mg total) by mouth daily. Take with largest meal.  30 tablet  1  . Multiple Vitamin (MULTIVITAMIN WITH MINERALS) TABS tablet Take 1 tablet by mouth daily.      Marland Kitchen amoxicillin-clavulanate (AUGMENTIN) 875-125 MG per tablet Take 1 tablet by mouth 2 (two)  times daily.  10 tablet  0  . levocetirizine (XYZAL) 5 MG tablet Take 5 mg by mouth as needed.        No facility-administered medications prior to visit.    PE: Blood pressure 145/79, pulse 82, temperature 98.3 F (36.8 C), temperature source Oral, resp. rate 18, height 5\' 3"  (1.6 m), weight 179 lb (81.194 kg), SpO2 95.00%. Gen: Alert, well appearing.  Patient is oriented to person, place, time, and situation. GDJ:MEQA: no injection, icteris, swelling, or exudate.  EOMI, PERRLA. Mouth: lips without lesion/swelling.  Oral mucosa pink and moist. Oropharynx without erythema, exudate, or swelling.  Neck - No masses or thyromegaly or limitation in range of motion CV: RRR, no m/r/g.   LUNGS: CTA bilat, nonlabored resps, good aeration in all lung fields. EXT: no clubbing, cyanosis, or edema.   LAB: none today IMPRESSION AND PLAN:  1) Generalized anxiety d/o with panic attacks. Start citalopram 20mg  qd.  Therapeutic expectations and side effect profile of medication discussed today.  Patient's questions answered. She declined short term prn benzo use. I filled out her FMLA form today while she was in the office.  2) DM 2, recently restarted metformin and is tolerating 500mg  once daily.  Spent 30 min with pt today, with >  50% of this time spent in counseling and care coordination regarding her anxiety and panic and work stress.  An After Visit Summary was printed and given to the patient.  FOLLOW UP: 4 wks

## 2013-08-30 ENCOUNTER — Ambulatory Visit: Payer: Federal, State, Local not specified - PPO | Admitting: Family Medicine

## 2013-09-27 ENCOUNTER — Ambulatory Visit: Payer: Self-pay | Admitting: Family Medicine

## 2014-01-20 ENCOUNTER — Other Ambulatory Visit: Payer: Self-pay | Admitting: Family Medicine

## 2014-01-21 ENCOUNTER — Telehealth: Payer: Self-pay | Admitting: Family Medicine

## 2014-01-21 MED ORDER — LISINOPRIL 5 MG PO TABS: 5.0000 mg | ORAL_TABLET | Freq: Every day | ORAL | Status: DC

## 2014-01-21 NOTE — Telephone Encounter (Signed)
Lattie Haw, it looks like you sent in the wrong bp med for her today (you sent in one with HCTZ in it and she was on the one w/out HCTZ before, right?).

## 2014-01-21 NOTE — Telephone Encounter (Signed)
I am happy to see patient but I decline working her in early.   She was advised 4 week follow up from July visit for generalized anxiety disorder. I request that she at a minimum follows up with Dr. Anitra Lauth about this before creating a 30 minute slot. BP medications could also be refilled at that time. If she makes this follow up appointment, then following up with me in May would be reasonable.

## 2014-01-21 NOTE — Telephone Encounter (Signed)
Spoke to Pt, she is taking lisinopril.  D/C'd lisinopril-HCTZ in her chart. Resent Rx to pharmacy.

## 2014-01-21 NOTE — Telephone Encounter (Signed)
Please advise Dr. Hunter. 

## 2014-01-21 NOTE — Telephone Encounter (Signed)
Pt would like to switch to dr hunter due to [facility] [place] is too far. Pt does not want to until may 2016.  Can I create 30 min slot? Pt bp med is about to run out

## 2014-01-21 NOTE — Telephone Encounter (Signed)
I sent in the medication requested electronically from pharmacy.  Tried to contact patient to confirm.  LMOM for pt to CB.

## 2014-02-27 ENCOUNTER — Other Ambulatory Visit: Payer: Self-pay | Admitting: Family Medicine

## 2014-03-25 ENCOUNTER — Other Ambulatory Visit: Payer: Self-pay | Admitting: Family Medicine

## 2014-03-25 MED ORDER — LISINOPRIL 5 MG PO TABS: 5.0000 mg | ORAL_TABLET | Freq: Every day | ORAL | Status: DC

## 2014-04-07 ENCOUNTER — Telehealth: Payer: Self-pay | Admitting: Nurse Practitioner

## 2014-04-07 ENCOUNTER — Other Ambulatory Visit: Payer: Self-pay | Admitting: Family Medicine

## 2014-04-07 DIAGNOSIS — E119 Type 2 diabetes mellitus without complications: Secondary | ICD-10-CM

## 2014-04-07 NOTE — Telephone Encounter (Signed)
Pls notify pt that I authorized 30d supply of her bp med with 1 additional RF.  She is overdue for office f/u and needs to make office appt for 30 min f/u appt sometime in the next 2 mo (before this bp med RF is out)-thx

## 2014-04-07 NOTE — Telephone Encounter (Signed)
Please call in a RX for Metformin, she needs a refill.Dana Mcmahon

## 2014-04-07 NOTE — Telephone Encounter (Signed)
Please advise 

## 2014-04-08 MED ORDER — METFORMIN HCL 500 MG PO TABS: 500.0000 mg | ORAL_TABLET | Freq: Every day | ORAL | Status: DC

## 2014-07-22 ENCOUNTER — Ambulatory Visit: Payer: Federal, State, Local not specified - PPO | Admitting: Family Medicine

## 2014-07-24 ENCOUNTER — Ambulatory Visit (INDEPENDENT_AMBULATORY_CARE_PROVIDER_SITE_OTHER): Payer: Federal, State, Local not specified - PPO | Admitting: Family Medicine

## 2014-07-24 ENCOUNTER — Encounter: Payer: Self-pay | Admitting: Family Medicine

## 2014-07-24 VITALS — BP 144/90 | HR 67 | Temp 98.4°F | Wt 173.0 lb

## 2014-07-24 DIAGNOSIS — R002 Palpitations: Secondary | ICD-10-CM | POA: Diagnosis not present

## 2014-07-24 DIAGNOSIS — E1165 Type 2 diabetes mellitus with hyperglycemia: Secondary | ICD-10-CM | POA: Diagnosis not present

## 2014-07-24 DIAGNOSIS — IMO0002 Reserved for concepts with insufficient information to code with codable children: Secondary | ICD-10-CM

## 2014-07-24 DIAGNOSIS — E1169 Type 2 diabetes mellitus with other specified complication: Secondary | ICD-10-CM | POA: Insufficient documentation

## 2014-07-24 DIAGNOSIS — E785 Hyperlipidemia, unspecified: Secondary | ICD-10-CM

## 2014-07-24 DIAGNOSIS — Z1211 Encounter for screening for malignant neoplasm of colon: Secondary | ICD-10-CM | POA: Diagnosis not present

## 2014-07-24 DIAGNOSIS — Z23 Encounter for immunization: Secondary | ICD-10-CM | POA: Diagnosis not present

## 2014-07-24 DIAGNOSIS — E669 Obesity, unspecified: Secondary | ICD-10-CM | POA: Insufficient documentation

## 2014-07-24 DIAGNOSIS — E66811 Obesity, class 1: Secondary | ICD-10-CM | POA: Insufficient documentation

## 2014-07-24 DIAGNOSIS — I10 Essential (primary) hypertension: Secondary | ICD-10-CM | POA: Diagnosis not present

## 2014-07-24 DIAGNOSIS — J309 Allergic rhinitis, unspecified: Secondary | ICD-10-CM | POA: Insufficient documentation

## 2014-07-24 LAB — COMPREHENSIVE METABOLIC PANEL
ALK PHOS: 89 U/L (ref 39–117)
ALT: 19 U/L (ref 0–35)
AST: 16 U/L (ref 0–37)
Albumin: 4.1 g/dL (ref 3.5–5.2)
BUN: 12 mg/dL (ref 6–23)
CALCIUM: 9.6 mg/dL (ref 8.4–10.5)
CO2: 28 meq/L (ref 19–32)
Chloride: 103 mEq/L (ref 96–112)
Creatinine, Ser: 0.7 mg/dL (ref 0.40–1.20)
GFR: 90.86 mL/min (ref >60.00)
GLUCOSE: 116 mg/dL — AB (ref 70–99)
POTASSIUM: 3.7 meq/L (ref 3.5–5.1)
Sodium: 139 mEq/L (ref 135–145)
Total Bilirubin: 0.6 mg/dL (ref 0.2–1.2)
Total Protein: 7.6 g/dL (ref 6.0–8.3)

## 2014-07-24 LAB — LIPID PANEL
CHOL/HDL RATIO: 5
Cholesterol: 234 mg/dL — ABNORMAL HIGH (ref 0–200)
HDL: 45.2 mg/dL (ref >39.00)
NONHDL: 188.8
TRIGLYCERIDES: 221 mg/dL — AB (ref 0.0–149.0)
VLDL: 44.2 mg/dL — AB (ref 0.0–40.0)

## 2014-07-24 LAB — HEMOGLOBIN A1C: Hgb A1c MFr Bld: 6.9 % — ABNORMAL HIGH (ref 4.6–6.5)

## 2014-07-24 LAB — CBC
HEMATOCRIT: 42.4 % (ref 36.0–46.0)
Hemoglobin: 14.2 g/dL (ref 12.0–15.0)
MCHC: 33.5 g/dL (ref 30.0–36.0)
MCV: 84.2 fl (ref 78.0–100.0)
Platelets: 310 10*3/uL (ref 150.0–400.0)
RBC: 5.04 Mil/uL (ref 3.87–5.11)
RDW: 15.5 % (ref 11.5–15.5)
WBC: 5.2 10*3/uL (ref 4.0–10.5)

## 2014-07-24 LAB — LDL CHOLESTEROL, DIRECT: LDL DIRECT: 137 mg/dL

## 2014-07-24 NOTE — Patient Instructions (Addendum)
Labs today   Tdap today  Get mammogram scheduled.  [facility] will call to schedule colonoscopy.  Have eye exam faxed to [place] at [phone].  Blood pressure slightly high on both measures- let's work on [employer] plan, continue walking. Congrats on 6 lbs down. Continue to cut down on soda.   Sign release of information at the front desk for records from Dr. Karlton Lemon including office notes for last 5 years, immunizations, pap smears, any imaging or echocardiograms.   Let's check in 1 month from now     [place] stands for "Dietary Approaches to Stop Hypertension." The DASH eating plan is a healthy eating plan that has been shown to reduce high blood pressure (hypertension). Additional health benefits may include reducing the risk of type 2 diabetes mellitus, heart disease, and stroke. The DASH eating plan may also help with weight loss. WHAT DO I NEED TO KNOW ABOUT THE DASH EATING PLAN? For the DASH eating plan, you will follow these general guidelines:  Choose foods with a percent daily value for sodium of less than 5% (as listed on the food label).  Use salt-free seasonings or herbs instead of table salt or sea salt.  Check with your health care provider or pharmacist before using salt substitutes.  Eat lower-sodium products, often labeled as "lower sodium" or "no salt added."  Eat fresh foods.  Eat more vegetables, fruits, and low-fat dairy products.  Choose whole grains. Look for the word "whole" as the first word in the ingredient list.  Choose fish and skinless chicken or [place] more often than red meat. Limit fish, poultry, and meat to 6 oz (170 g) each day.  Limit sweets, desserts, sugars, and sugary drinks.  Choose heart-healthy fats.  Limit cheese to 1 oz (28 g) per day.  Eat more home-cooked food and less restaurant, buffet, and fast food.  Limit fried foods.  Cook foods using methods other than frying.  Limit canned vegetables. If you do use  them, rinse them well to decrease the sodium.  When eating at a restaurant, ask that your food be prepared with less salt, or no salt if possible. WHAT FOODS CAN I EAT? Seek help from a dietitian for individual calorie needs. Grains Whole grain or whole wheat bread. Brown rice. Whole grain or whole wheat pasta. Quinoa, bulgur, and whole grain cereals. Low-sodium cereals. Corn or whole wheat flour tortillas. Whole grain cornbread. Whole grain crackers. Low-sodium crackers. Vegetables Fresh or frozen vegetables (raw, steamed, roasted, or grilled). Low-sodium or reduced-sodium tomato and vegetable juices. Low-sodium or reduced-sodium tomato sauce and paste. Low-sodium or reduced-sodium canned vegetables.  Fruits All fresh, canned (in natural juice), or frozen fruits. Meat and Other Protein Products Ground beef (85% or leaner), grass-fed beef, or beef trimmed of fat. Skinless chicken or [place]. Ground chicken or [place]. Pork trimmed of fat. All fish and seafood. Eggs. Dried beans, peas, or lentils. Unsalted nuts and seeds. Unsalted canned beans. Dairy Low-fat dairy products, such as skim or 1% milk, 2% or reduced-fat cheeses, low-fat ricotta or cottage cheese, or plain low-fat yogurt. Low-sodium or reduced-sodium cheeses. Fats and Oils Tub margarines without trans fats. Light or reduced-fat mayonnaise and salad dressings (reduced sodium). Avocado. Safflower, olive, or canola oils. Natural peanut or almond butter. Other Unsalted popcorn and pretzels. The items listed above may not be a complete list of recommended foods or beverages. Contact your dietitian for more options. WHAT FOODS ARE NOT RECOMMENDED? Grains White bread. White pasta.  White rice. Refined cornbread. Bagels and croissants. Crackers that contain trans fat. Vegetables Creamed or fried vegetables. Vegetables in a cheese sauce. Regular canned vegetables. Regular canned tomato sauce and paste. Regular tomato and vegetable  juices. Fruits Dried fruits. Canned fruit in light or heavy syrup. Fruit juice. Meat and Other Protein Products Fatty cuts of meat. Ribs, chicken wings, bacon, sausage, bologna, salami, chitterlings, fatback, hot dogs, bratwurst, and packaged luncheon meats. Salted nuts and seeds. Canned beans with salt. Dairy Whole or 2% milk, cream, half-and-half, and cream cheese. Whole-fat or sweetened yogurt. Full-fat cheeses or blue cheese. Nondairy creamers and whipped toppings. Processed cheese, cheese spreads, or cheese curds. Condiments Onion and garlic salt, seasoned salt, table salt, and sea salt. Canned and packaged gravies. Worcestershire sauce. Tartar sauce. Barbecue sauce. Teriyaki sauce. Soy sauce, including reduced sodium. Steak sauce. Fish sauce. Oyster sauce. Cocktail sauce. Horseradish. Ketchup and mustard. Meat flavorings and tenderizers. Bouillon cubes. Hot sauce. Tabasco sauce. Marinades. Taco seasonings. Relishes. Fats and Oils Butter, stick margarine, lard, shortening, ghee, and bacon fat. Coconut, palm kernel, or palm oils. Regular salad dressings. Other Pickles and olives. Salted popcorn and pretzels. The items listed above may not be a complete list of foods and beverages to avoid. Contact your dietitian for more information. WHERE CAN I FIND MORE INFORMATION? National Heart, Lung, and Blood Institute: travelstabloid.com Document Released: 01/27/2011 Document Revised: 06/24/2013 Document Reviewed: 12/12/2012 [facility] Patient Information 2015 [place], [place]. This information is not intended to replace advice given to you by your health care provider. Make sure you discuss any questions you have with your health care provider.

## 2014-07-24 NOTE — Progress Notes (Signed)
Dana Reddish, MD Phone: [phone]  Subjective:  Patient presents today to establish care with me as their new primary care provider. Patient was formerly a patient of Dr. Anitra Lauth. Chief complaint-noted.   Hypertension-poor control  BP Readings from Last 3 Encounters:  07/24/14 144/90  08/22/13 145/79  08/08/13 126/75   Home BP monitoring-no Compliant with medications-yes without side effects ROS-Denies any CP, HA, SOB, blurry vision, LE edema. Sometimes lightheaded with exercise.   Diabetes-previous mild poor control on metformin 500mg , last check over a year  Lab Results  Component Value Date   HGBA1C 7.7* 05/31/2013  ROS- no hypoglycemia, no feet numbness/tingling  Hyperlipidemia-unknown control History of this reported with no recent labs available ROS- no chest pain, shortness of breath  The following were reviewed and entered/updated in epic: Past Medical History  Diagnosis Date  . Hypertension   . Allergic rhinitis   . 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 Neck pain, chronic     Mild DDD, no signif progression (multiple MRI's: 2001, 2003, 2005, 2007)  . DM type 2 (diabetes mellitus, type 2)   . Hyperlipidemia     per old records, pt has refused cholesto-lowering meds  . Obesity, Class I, BMI 30-34.9    Patient Active Problem List   Diagnosis Date Noted  . Diabetes mellitus type II, uncontrolled 05/10/2013    Priority: High  . Hyperlipidemia     Priority: Medium  . HTN (hypertension), benign 05/10/2013    Priority: Medium  . Allergic rhinitis 07/24/2014    Priority: Low  . Obesity, Class I, BMI 30-34.9     Priority: Low  . Dizzy spells 08/08/2013    Priority: Low  . Palpitations 05/10/2013    Priority: Low   Past Surgical History  Procedure Laterality Date  . None      Family History  Problem Relation Age of Onset  . Stroke Mother     71s, and father 27s  . Hypertension Mother     and father  . Stroke Brother     in 68s     Medications- reviewed and updated Current Outpatient Prescriptions  Medication Sig Dispense Refill  . aspirin EC 81 MG tablet Take 1 tablet (81 mg total) by mouth daily. 30 tablet 0  . citalopram (CELEXA) 20 MG tablet Take 1 tablet (20 mg total) by mouth daily. 30 tablet 1  . fluticasone (FLONASE) 50 MCG/ACT nasal spray Place 2 sprays into both nostrils daily. 1 g 5  . lisinopril (PRINIVIL,ZESTRIL) 5 MG tablet Take 1 tablet (5 mg total) by mouth daily. 90 tablet 0  . Multiple Vitamin (MULTIVITAMIN WITH MINERALS) TABS tablet Take 1 tablet by mouth daily.    . metFORMIN (GLUCOPHAGE) 500 MG tablet Take 1 tablet (500 mg total) by mouth daily. Take with largest meal. 30 tablet 1   Allergies-reviewed and updated Allergies  Allergen Reactions  . Codeine Nausea Only  . Metformin And Related Other (See Comments)    GI intolerance    History   Social History  . Marital Status: Married    Spouse Name: N/A  . Number of Children: N/A  . Years of Education: N/A   Social History Main Topics  . Smoking status: Never Smoker   . Smokeless tobacco: Not on file  . Alcohol Use: No  . Drug Use: No  . Sexual Activity: Not on file   Other Topics Concern  . Not on file   Social History Narrative  Married, 4 children (daughter Charisma patient here, husband Patsy Baltimore, son Patsy [place]). 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      Hobbies: time with family, watching news, reading                ROS--See HPI   Objective: BP 144/90 mmHg  Pulse 67  Temp(Src) 98.4 F (36.9 C)  Wt 173 lb (78.472 kg) Gen: NAD, resting comfortably HEENT: Mucous membranes are moist. Oropharynx normal. Some missing teeth CV: RRR no murmurs rubs or gallops Lungs: CTAB no crackles, wheeze, rhonchi Abdomen: soft/nontender/nondistended/normal bowel sounds. No rebound or guarding.  Mild low back pain with palpation, some painwith  lifting L arm overhead Ext: no edema Skin: warm, dry Neuro: grossly normal, moves all extremities, PERRLA  Diabetic Foot Exam - Simple   Simple Foot Form  Diabetic Foot exam was performed with the following findings:  Yes 07/24/2014  8:51 AM  Visual Inspection  No deformities, no ulcerations, no other skin breakdown bilaterally:  Yes  Sensation Testing  Intact to touch and monofilament testing bilaterally:  Yes  Pulse Check  Posterior Tibialis and Dorsalis pulse intact bilaterally:  Yes  Comments  Slight callous bilaterally under transverse arch     Assessment/Plan:  Requests ergonomic chair for work for low back pain and L shoulder pain with reaching overhead- both confirmed on exam.   Diabetes mellitus type II, uncontrolled Has lost some weight. Check a1c, continue metformin 500mg  for now. Last a1c 7.7 a year ago.    HTN (hypertension), benign Poor control on lisinopril 5mg . Advised dash diet as medication resistant. Follow up 1 month. Strong family history of CVA in mom, dad, brother and really need to get this BP down prefer <130/90 if possible   Hyperlipidemia Check lipids today, needs continued weight loss.    1 month visit.   Orders Placed This Encounter  Procedures  . Tdap vaccine greater than or equal to 7yo IM  . Hemoglobin A1c  . CBC    [place]  . Comprehensive metabolic panel    [place]    Order Specific Question:  Has the patient fasted?    Answer:  No  . Lipid panel    [place]    Order Specific Question:  Has the patient fasted?    Answer:  No  . Ambulatory referral to Gastroenterology    Referral Priority:  Routine    Referral Type:  Consultation    Referral Reason:  Specialty Services Required    Requested Specialty:  Gastroenterology    Number of Visits Requested:  1   Health Maintenance Due  Topic Date Due  . FOOT EXAM - today 01/10/1965  . OPHTHALMOLOGY EXAM - advised 01/10/1965  . HIV Screening - next bloodwork 01/10/1970  . PAP SMEAR -  get records 01/10/1973  . TETANUS/TDAP - today 01/10/1974  . COLONOSCOPY - ordered 01/10/2005  . MAMMOGRAM - advised 08/14/2013  . HEMOGLOBIN A1C - today 11/30/2013

## 2014-07-24 NOTE — Assessment & Plan Note (Signed)
Check lipids today, needs continued weight loss.

## 2014-07-24 NOTE — Assessment & Plan Note (Signed)
Has lost some weight. Check a1c, continue metformin 500mg  for now. Last a1c 7.7 a year ago.

## 2014-07-24 NOTE — Assessment & Plan Note (Signed)
Poor control on lisinopril 5mg . Advised dash diet as medication resistant. Follow up 1 month. Strong family history of CVA in mom, dad, brother and really need to get this BP down prefer <130/90 if possible

## 2014-08-05 ENCOUNTER — Other Ambulatory Visit: Payer: Self-pay | Admitting: Family Medicine

## 2014-08-11 ENCOUNTER — Other Ambulatory Visit: Payer: Self-pay | Admitting: Family Medicine

## 2014-08-22 ENCOUNTER — Ambulatory Visit: Payer: Self-pay | Admitting: Family Medicine

## 2014-09-19 ENCOUNTER — Encounter: Payer: Self-pay | Admitting: Family Medicine

## 2014-09-19 ENCOUNTER — Ambulatory Visit (INDEPENDENT_AMBULATORY_CARE_PROVIDER_SITE_OTHER): Payer: Federal, State, Local not specified - PPO | Admitting: Family Medicine

## 2014-09-19 VITALS — BP 158/94 | HR 72 | Temp 98.5°F | Wt 175.0 lb

## 2014-09-19 DIAGNOSIS — R002 Palpitations: Secondary | ICD-10-CM

## 2014-09-19 DIAGNOSIS — E785 Hyperlipidemia, unspecified: Secondary | ICD-10-CM

## 2014-09-19 DIAGNOSIS — R42 Dizziness and giddiness: Secondary | ICD-10-CM

## 2014-09-19 DIAGNOSIS — E1165 Type 2 diabetes mellitus with hyperglycemia: Secondary | ICD-10-CM | POA: Diagnosis not present

## 2014-09-19 DIAGNOSIS — I10 Essential (primary) hypertension: Secondary | ICD-10-CM | POA: Diagnosis not present

## 2014-09-19 DIAGNOSIS — IMO0002 Reserved for concepts with insufficient information to code with codable children: Secondary | ICD-10-CM

## 2014-09-19 MED ORDER — LISINOPRIL 20 MG PO TABS: 20.0000 mg | ORAL_TABLET | Freq: Every day | ORAL | Status: DC

## 2014-09-19 NOTE — Assessment & Plan Note (Signed)
S: poor control Lab Results  Component Value Date   CHOL 234* 07/24/2014   HDL 45.20 07/24/2014   LDLDIRECT 137.0 07/24/2014   TRIG 221.0* 07/24/2014   CHOLHDL 5 07/24/2014  A/P: patient refuses statin. Wants to work on healthy habits. Suspect will have a hard time controlling but hopeful. Repeat at 1 year and continue to push for healthy habits.

## 2014-09-19 NOTE — Progress Notes (Signed)
Dana Reddish, MD  Subjective:  Dana Mcmahon is a 59 y.o. year old very pleasant female patient who presents with:  See problem oriented charting ROS-Denies any CP, HA, SOB, blurry vision, LE edema. No myalgias  Past Medical History- DM, HTn, HLD, paplpitations and dizzy spells.   Medications- reviewed and updated Current Outpatient Prescriptions  Medication Sig Dispense Refill  . aspirin EC 81 MG tablet Take 1 tablet (81 mg total) by mouth daily. 30 tablet 0  . lisinopril (PRINIVIL,ZESTRIL) 5 MG tablet Take 1 tablet (5 mg total) by mouth daily. 90 tablet 0  . metFORMIN (GLUCOPHAGE) 500 MG tablet TAKE 1 TABLET BY MOUTH EVERY DAY WITH THE LARGEST MEAL 30 tablet 6  . Multiple Vitamin (MULTIVITAMIN WITH MINERALS) TABS tablet Take 1 tablet by mouth daily.    . fluticasone (FLONASE) 50 MCG/ACT nasal spray Place 2 sprays into both nostrils daily. (Patient not taking: Reported on 09/19/2014) 1 g 5   Objective: BP 158/94 mmHg  Pulse 72  Temp(Src) 98.5 F (36.9 C)  Wt 175 lb (79.379 kg) Gen: NAD, resting comfortably CV: RRR no murmurs rubs or gallops No chest wall tenderness Lungs: CTAB no crackles, wheeze, rhonchi Abdomen: soft/nontender/nondistended/normal bowel sounds.  Ext: no edema Skin: warm, dry Neuro: grossly normal, moves all extremities   Assessment/Plan:  Dizzy spells Palpitations S:At least once a day feels heart racing. Has felt some nausea and feels like may fall with dizziness at this times as well. Not always together.  Dental procedure 7/18 and took hydrocodone- felt nauseous and later threw up- no recurrence. Feels it every day. Episodes like this for years. Has seen prior MD as well as Dr. Ernestine Conrad with his notes "recurrent episodes of dizziness/lightheadedness and palpitations. She says these episodes last a whole day. Very fatigued. No trigger or pattern can be detected EXCEPT for stress/anxiety. She rarely drinks caffeine now.  She has these less on her days  off. +Snoring but no known apneic events. She has chronic rhinitis with PND, no better with recent course of augmentin. Takes flonase daily and prn benadryl. Ears itch. No popping or fullness in ears. Holter monitor in the last couple of years was done for these sx's and it was normal. Lab w/u and EKGs for these sx's in the past has been normal." These were interpreted as GAD plus panic attacks and advised citalopram but patient never took this medicine.  A/P: I agree with Dr. Anitra Lauth this is likely GAD related with panic attack features. I cannot find a copy of prior holter though and we will obtain a repeat at this point (ordered through cards as cards visit would take 1-2 months). Could consider stress test with or without PVCs as well just for thoroughness to lower concern for CAD thoguh I doubt. In long run, likely to push for SSRI again. We will f/u 1 week after holter interpretation.    Diabetes mellitus type II, uncontrolled S: much improved control last check on Metformin 500mg  daily. Patient is trying to exercise but is struggling with weight maintenance Lab Results  Component Value Date   HGBA1C 6.9* 07/24/2014  A/P: continue current rx.    HTN (hypertension), benign S:Wt up 2 lbs. Moderate poor control on SBP and DBP BP Readings from Last 3 Encounters:  09/19/14 158/94  07/24/14 144/90  08/22/13 145/79  A/P: patient hesitant to increase meds but agrees to 20mg  if above 140/90 on lisinopril 10mg  (5mg  increase) which I suspect it will be. Encouraged home cuff  and sooner follow up if BP eelvated.    Hyperlipidemia S: poor control Lab Results  Component Value Date   CHOL 234* 07/24/2014   HDL 45.20 07/24/2014   LDLDIRECT 137.0 07/24/2014   TRIG 221.0* 07/24/2014   CHOLHDL 5 07/24/2014  A/P: patient refuses statin. Wants to work on healthy habits. Suspect will have a hard time controlling but hopeful. Repeat at 1 year and continue to push for healthy habits.    f/u  after holter results (1 week later)  Orders Placed This Encounter  Procedures  . Holter monitor - 24 hour    Standing Status: Future     Number of Occurrences:      Standing Expiration Date: 09/18/2024    Meds ordered this encounter  Medications  . lisinopril (PRINIVIL,ZESTRIL) 20 MG tablet    Sig: Take 1 tablet (20 mg total) by mouth daily.    Dispense:  30 tablet    Refill:  5

## 2014-09-19 NOTE — Assessment & Plan Note (Signed)
S:Wt up 2 lbs. Moderate poor control on SBP and DBP BP Readings from Last 3 Encounters:  09/19/14 158/94  07/24/14 144/90  08/22/13 145/79  A/P: patient hesitant to increase meds but agrees to 20mg  if above 140/90 on lisinopril 10mg  (5mg  increase) which I suspect it will be. Encouraged home cuff and sooner follow up if BP eelvated.

## 2014-09-19 NOTE — Assessment & Plan Note (Signed)
S: much improved control last check on Metformin 500mg  daily. Patient is trying to exercise but is struggling with weight maintenance Lab Results  Component Value Date   HGBA1C 6.9* 07/24/2014  A/P: continue current rx.

## 2014-09-19 NOTE — Patient Instructions (Addendum)
Get mammogram, colonoscopy and eye exam schedule and also copy of PAP and have results faxed to [place] at [phone].  Call [facility] back to schedule colonscopy at [phone].   Buy a blood pressure cuff such as the one I printed off. Take 10mg  of lisinopril (1/2 a 20mg  pill) unless blood pressure >140/90 more than 2 days in a row then increase to full pill.   Continue to work on weight loss  We will call you within a week about your referral for heart monitoring. If you do not hear within 2 weeks, give [place] a call. Schedule a visit with me after you hear about your results from the monitoring

## 2014-09-19 NOTE — Assessment & Plan Note (Addendum)
Palpitations S:At least once a day feels heart racing. Has felt some nausea and feels like may fall with dizziness at this times as well. Not always together.  Dental procedure 7/18 and took hydrocodone- felt nauseous and later threw up- no recurrence. Feels it every day. Episodes like this for years. Has seen prior MD as well as Dr. Ernestine Conrad with his notes "recurrent episodes of dizziness/lightheadedness and palpitations. She says these episodes last a whole day. Very fatigued. No trigger or pattern can be detected EXCEPT for stress/anxiety. She rarely drinks caffeine now.  She has these less on her days off. +Snoring but no known apneic events. She has chronic rhinitis with PND, no better with recent course of augmentin. Takes flonase daily and prn benadryl. Ears itch. No popping or fullness in ears. Holter monitor in the last couple of years was done for these sx's and it was normal. Lab w/u and EKGs for these sx's in the past has been normal." These were interpreted as GAD plus panic attacks and advised citalopram but patient never took this medicine.  A/P: I agree with Dr. Anitra Lauth this is likely GAD related with panic attack features. I cannot find a copy of prior holter though and we will obtain a repeat at this point (ordered through cards as cards visit would take 1-2 months). Could consider stress test with or without PVCs as well just for thoroughness to lower concern for CAD thoguh I doubt. In long run, likely to push for SSRI again. We will f/u 1 week after holter interpretation.

## 2014-09-24 ENCOUNTER — Other Ambulatory Visit: Payer: Self-pay | Admitting: Family Medicine

## 2014-09-24 DIAGNOSIS — Z1231 Encounter for screening mammogram for malignant neoplasm of breast: Secondary | ICD-10-CM

## 2014-09-30 ENCOUNTER — Other Ambulatory Visit: Payer: Self-pay | Admitting: Family Medicine

## 2014-09-30 DIAGNOSIS — R002 Palpitations: Secondary | ICD-10-CM

## 2014-09-30 DIAGNOSIS — R42 Dizziness and giddiness: Secondary | ICD-10-CM

## 2014-10-01 ENCOUNTER — Ambulatory Visit (INDEPENDENT_AMBULATORY_CARE_PROVIDER_SITE_OTHER): Payer: Federal, State, Local not specified - PPO

## 2014-10-01 DIAGNOSIS — R42 Dizziness and giddiness: Secondary | ICD-10-CM

## 2014-10-01 DIAGNOSIS — R002 Palpitations: Secondary | ICD-10-CM

## 2014-10-03 ENCOUNTER — Other Ambulatory Visit: Payer: Self-pay | Admitting: Family Medicine

## 2014-10-03 ENCOUNTER — Ambulatory Visit (HOSPITAL_COMMUNITY)
Admission: RE | Admit: 2014-10-03 | Discharge: 2014-10-03 | Disposition: A | Discharge status: Home / Self Care | Payer: Federal, State, Local not specified - PPO | Source: Ambulatory Visit | Attending: Family Medicine | Admitting: Family Medicine

## 2014-10-03 DIAGNOSIS — Z1231 Encounter for screening mammogram for malignant neoplasm of breast: Secondary | ICD-10-CM | POA: Insufficient documentation

## 2014-11-09 ENCOUNTER — Other Ambulatory Visit: Payer: Self-pay | Admitting: Family Medicine

## 2014-12-16 ENCOUNTER — Encounter: Payer: Self-pay | Admitting: Family Medicine

## 2014-12-16 ENCOUNTER — Ambulatory Visit (INDEPENDENT_AMBULATORY_CARE_PROVIDER_SITE_OTHER): Payer: Federal, State, Local not specified - PPO | Admitting: Family Medicine

## 2014-12-16 VITALS — BP 150/100 | HR 66 | Temp 98.2°F | Wt 178.0 lb

## 2014-12-16 DIAGNOSIS — R42 Dizziness and giddiness: Secondary | ICD-10-CM

## 2014-12-16 DIAGNOSIS — R002 Palpitations: Secondary | ICD-10-CM

## 2014-12-16 DIAGNOSIS — I10 Essential (primary) hypertension: Secondary | ICD-10-CM | POA: Diagnosis not present

## 2014-12-16 DIAGNOSIS — M19042 Primary osteoarthritis, left hand: Secondary | ICD-10-CM

## 2014-12-16 DIAGNOSIS — M19049 Primary osteoarthritis, unspecified hand: Secondary | ICD-10-CM | POA: Insufficient documentation

## 2014-12-16 DIAGNOSIS — M19041 Primary osteoarthritis, right hand: Secondary | ICD-10-CM | POA: Diagnosis not present

## 2014-12-16 MED ORDER — LISINOPRIL-HYDROCHLOROTHIAZIDE 20-12.5 MG PO TABS: 1.0000 | ORAL_TABLET | Freq: Every day | ORAL | Status: DC

## 2014-12-16 NOTE — Assessment & Plan Note (Signed)
S: reviewed holter monitor which was largely reassuring with rare PACs and PVCs. Patient states she has some period of time which was improved for dizzy spells. But Lightheaded and dizzy for 2.5 weeks Comes and goes >10x a day. Seconds.  Feels weak as well with these episodes Several seconds of palpitations when this occurs Stopped exercising last week as distressing Admits to high levels of stress recently with Work stress and Husband loss job A/P: I have very strong suspicion that her dizzy spells, palpitations are anxiety related. Given age and poor control BP, need to rule out cardiac disease with stress testing and this has been ordered. If low risk, would continue to counsel for SSRI. Patient declines at present but did accept handout to [facility] and will consider.

## 2014-12-16 NOTE — Progress Notes (Signed)
Garret Reddish, MD  Subjective:  RONEKA GILPIN is a 59 y.o. year old very pleasant female patient who presents for/with See problem oriented charting ROS- no chest pain, shortness of breath, diaphoresis, nausea, or vomiting with episodes of palpitations and dizzy spells.   Past Medical History-  Patient Active Problem List   Diagnosis Date Noted  . Dizzy spells 08/08/2013    Priority: High  . Diabetes mellitus type II, uncontrolled ([place]) 05/10/2013    Priority: High  . Hyperlipidemia     Priority: Medium  . HTN (hypertension), benign 05/10/2013    Priority: Medium  . Allergic rhinitis 07/24/2014    Priority: Low  . Obesity, Class I, BMI 30-34.9     Priority: Low  . Palpitations 05/10/2013    Priority: Low  . Osteoarthritis, hand 12/16/2014    Medications- reviewed and updated Current Outpatient Prescriptions  Medication Sig Dispense Refill  . aspirin EC 81 MG tablet Take 1 tablet (81 mg total) by mouth daily. 30 tablet 0  . lisinopril (PRINIVIL,ZESTRIL) 20 MG tablet Take 1 tablet (20 mg total) by mouth daily. 30 tablet 5  . metFORMIN (GLUCOPHAGE) 500 MG tablet TAKE 1 TABLET BY MOUTH EVERY DAY WITH THE LARGEST MEAL 30 tablet 6  . Multiple Vitamin (MULTIVITAMIN WITH MINERALS) TABS tablet Take 1 tablet by mouth daily.    . fluticasone (FLONASE) 50 MCG/ACT nasal spray Place 2 sprays into both nostrils daily. (Patient not taking: Reported on 09/19/2014) 1 g 5     Objective: BP 150/100 mmHg  Pulse 66  Temp(Src) 98.2 F (36.8 C)  Wt 178 lb (80.74 kg) Gen: NAD, resting comfortably CV: RRR no murmurs rubs or gallops Lungs: CTAB no crackles, wheeze, rhonchi Abdomen: soft/nontender/nondistended/normal bowel sounds. No rebound or guarding.  Ext: no edema Skin: warm, dry  Neuro: CN II-XII intact, sensation and reflexes normal throughout, 5/5 muscle strength in bilateral upper and lower extremities. Normal finger to nose. Normal rapid alternating movements.     Assessment/Plan:  Dizzy spells S: reviewed holter monitor which was largely reassuring with rare PACs and PVCs. Patient states she has some period of time which was improved for dizzy spells. But Lightheaded and dizzy for 2.5 weeks Comes and goes >10x a day. Seconds.  Feels weak as well with these episodes Several seconds of palpitations when this occurs Stopped exercising last week as distressing Admits to high levels of stress recently with Work stress and Husband loss job A/P: I have very strong suspicion that her dizzy spells, palpitations are anxiety related. Given age and poor control BP, need to rule out cardiac disease with stress testing and this has been ordered. If low risk, would continue to counsel for SSRI. Patient declines at present but did accept handout to [facility] and will consider.    Osteoarthritis, hand S: pain for sometime but worsened recently. Worse with mouse at work.  O:Some pain with palpation at MTP right hand A/p:Wrote note for work for ergonomic mouse as this seems to be time she has the most issues.   HTN (hypertension), benign S: poorly controlled BP Readings from Last 3 Encounters:  12/16/14 150/100  09/19/14 158/94  07/24/14 144/90  A/P: I am not confident hctz 12.5 will get patient to goal but we will trial. If continues to have issues increase combo pill to BID or two at a time of lisinopril-hctz 20-12.5mg . See avs for follow up  BP elevation could cause some dizziness as well but doubt in constellation of  palpitations, dizziness, weakness that patient gets  4-6 week BP check  Orders Placed This Encounter  Procedures  . Myocardial Perfusion Imaging    Standing Status: Future     Number of Occurrences:      Standing Expiration Date: 12/16/2015    Order Specific Question:  Where should this test be performed    Answer:  [facility])    Order Specific Question:  Type of stress    Answer:  Lexiscan     Order Specific Question:  Patient weight in lbs    Answer:  178    Meds ordered this encounter  Medications  . lisinopril-hydrochlorothiazide (PRINZIDE,ZESTORETIC) 20-12.5 MG tablet    Sig: Take 1 tablet by mouth daily.    Dispense:  30 tablet    Refill:  5

## 2014-12-16 NOTE — Patient Instructions (Signed)
Your blood pressure trend concerns me. I want you to change your pill from lisinopril 20mg  to lisinopril-hydrochlorothiazide 20-12.5mg . I would like for you to buy/use a home cuff to check at least 4x a week. Your goal is <140/90 BP Readings from Last 3 Encounters:  12/16/14 150/100  09/19/14 158/94  07/24/14 144/90   If you note in the next few weeks that it is higher than our goal, see me sooner. Otherwise, see me in 4-6 weeks. Bring your home cuff and your log of blood pressures with you to visit. Hopefully you will have had your stress test by the time of our visit so we can discuss next steps  If you have chest pain, worsening symptoms or new symptoms, could see me sooner  Consider meeting with a therapist/counselor as I think stress/anxiety has a big role in your symptoms. This could help your palpitations and dizzy feelings (but so could controlling your blood pressure)

## 2014-12-16 NOTE — Assessment & Plan Note (Addendum)
S: pain for sometime but worsened recently. Worse with mouse at work.  O:Some pain with palpation at MTP right hand A/p:Wrote note for work for ergonomic mouse as this seems to be time she has the most issues.

## 2014-12-16 NOTE — Assessment & Plan Note (Signed)
S: poorly controlled BP Readings from Last 3 Encounters:  12/16/14 150/100  09/19/14 158/94  07/24/14 144/90  A/P: I am not confident hctz 12.5 will get patient to goal but we will trial. If continues to have issues increase combo pill to BID or two at a time of lisinopril-hctz 20-12.5mg . See avs for follow up

## 2014-12-17 ENCOUNTER — Telehealth (HOSPITAL_COMMUNITY): Payer: Self-pay | Admitting: *Deleted

## 2015-01-02 ENCOUNTER — Ambulatory Visit: Payer: Self-pay | Admitting: Family Medicine

## 2015-02-05 ENCOUNTER — Ambulatory Visit: Payer: Self-pay | Admitting: Family Medicine

## 2015-02-24 ENCOUNTER — Ambulatory Visit: Payer: Federal, State, Local not specified - PPO | Admitting: Family Medicine

## 2015-03-17 ENCOUNTER — Other Ambulatory Visit: Payer: Self-pay | Admitting: Family Medicine

## 2015-03-18 ENCOUNTER — Other Ambulatory Visit: Payer: Self-pay | Admitting: Family Medicine

## 2015-03-20 ENCOUNTER — Ambulatory Visit: Payer: Federal, State, Local not specified - PPO | Admitting: Family Medicine

## 2015-03-23 ENCOUNTER — Telehealth: Payer: Self-pay | Admitting: Family Medicine

## 2015-03-23 DIAGNOSIS — J0110 Acute frontal sinusitis, unspecified: Secondary | ICD-10-CM

## 2015-03-23 MED ORDER — LISINOPRIL-HYDROCHLOROTHIAZIDE 20-12.5 MG PO TABS: 1.0000 | ORAL_TABLET | Freq: Every day | ORAL | Status: DC

## 2015-03-23 MED ORDER — FLUTICASONE PROPIONATE 50 MCG/ACT NA SUSP: 2.0000 | Freq: Every day | NASAL | Status: DC

## 2015-03-23 MED ORDER — METFORMIN HCL 500 MG PO TABS: ORAL_TABLET | ORAL | Status: DC

## 2015-03-23 NOTE — Telephone Encounter (Signed)
Medication refilled

## 2015-03-23 NOTE — Telephone Encounter (Signed)
Pt needs rxs send to [facility] [address] fluticasone, lisinopril=hctz and metformin 500 mg. Pt would like #90 w/refills

## 2015-04-16 ENCOUNTER — Ambulatory Visit (INDEPENDENT_AMBULATORY_CARE_PROVIDER_SITE_OTHER): Payer: Federal, State, Local not specified - PPO | Admitting: Family Medicine

## 2015-04-16 ENCOUNTER — Encounter: Payer: Self-pay | Admitting: Family Medicine

## 2015-04-16 VITALS — BP 134/78 | HR 75 | Temp 98.2°F | Wt 177.0 lb

## 2015-04-16 DIAGNOSIS — Z23 Encounter for immunization: Secondary | ICD-10-CM

## 2015-04-16 DIAGNOSIS — R42 Dizziness and giddiness: Secondary | ICD-10-CM | POA: Diagnosis not present

## 2015-04-16 DIAGNOSIS — R002 Palpitations: Secondary | ICD-10-CM

## 2015-04-16 DIAGNOSIS — I10 Essential (primary) hypertension: Secondary | ICD-10-CM | POA: Diagnosis not present

## 2015-04-16 DIAGNOSIS — IMO0001 Reserved for inherently not codable concepts without codable children: Secondary | ICD-10-CM

## 2015-04-16 DIAGNOSIS — E1165 Type 2 diabetes mellitus with hyperglycemia: Secondary | ICD-10-CM | POA: Diagnosis not present

## 2015-04-16 LAB — POCT GLYCOSYLATED HEMOGLOBIN (HGB A1C): Hemoglobin A1C: 7.3

## 2015-04-16 NOTE — Progress Notes (Signed)
Garret Reddish, MD  Subjective:  Dana Mcmahon is a 60 y.o. year old very pleasant female patient who presents for/with See problem oriented charting ROS- no fever, chills, nausea, vomiting, chest pain, shortness of breath  Past Medical History-  Patient Active Problem List   Diagnosis Date Noted  . Dizzy spells 08/08/2013    Priority: High  . Diabetes mellitus type II, uncontrolled ([place]) 05/10/2013    Priority: High  . Hyperlipidemia     Priority: Medium  . HTN (hypertension), benign 05/10/2013    Priority: Medium  . Allergic rhinitis 07/24/2014    Priority: Low  . Obesity, Class I, BMI 30-34.9     Priority: Low  . Palpitations 05/10/2013    Priority: Low  . Osteoarthritis, hand 12/16/2014    Medications- reviewed and updated Current Outpatient Prescriptions  Medication Sig Dispense Refill  . aspirin EC 81 MG tablet Take 1 tablet (81 mg total) by mouth daily. 30 tablet 0  . fluticasone (FLONASE) 50 MCG/ACT nasal spray Place 2 sprays into both nostrils daily. 16 g 5  . lisinopril-hydrochlorothiazide (PRINZIDE,ZESTORETIC) 20-12.5 MG tablet Take 1 tablet by mouth daily. 90 tablet 3  . metFORMIN (GLUCOPHAGE) 500 MG tablet TAKE 1 TABLET BY MOUTH EVERY DAY WITH THE LARGEST MEAL 30 tablet 5  . Multiple Vitamin (MULTIVITAMIN WITH MINERALS) TABS tablet Take 1 tablet by mouth daily.     No current facility-administered medications for this visit.    Objective: BP 134/78 mmHg  Pulse 75  Temp(Src) 98.2 F (36.8 C)  Wt 177 lb (80.287 kg) Gen: NAD, resting comfortably CV: RRR no murmurs rubs or gallops Lungs: CTAB no crackles, wheeze, rhonchi Abdomen: soft/nontender/nondistended/normal bowel sounds. overweight Ext: no edema Skin: warm, dry, no rash Neuro: grossly normal, moves all extremities  Assessment/Plan:  Dizzy spells S: much less frequent but still occasionally occur. Previously thought may be anxiety related. Now having palpitations regularly but not the dizzy  spells- see palpitations A/P: continue to consider SSRI for anxiety- rule out ischemic disease first   Diabetes mellitus type II, uncontrolled S: a1c is stable today. Compliant with metformin Lab Results  Component Value Date   HGBA1C 7.3 04/16/2015  A/P: continue current metformin 500mg  daily. a1c up from 6.9 to 7.3- advised at least BID metformin but she declines. Wants to work on diet/exercise. 3.5 month follow up.     HTN (hypertension), benign S: controlled. On lisinopril hctz 20-12.5mg   BP Readings from Last 3 Encounters:  04/16/15 134/78  12/16/14 150/100  09/19/14 158/94  A/P:Continue current meds:  Much improved control with hctz addition   Palpitations S: palpitations 4-5x a day for about 20 minutes. Not having dizzy spells with this anymore. Does wonder if anxiety related A/P: we will check an echocardiogram. PAcs and PVCs on holter monitor   Return in about 4 months (around 08/14/2015). Return precautions advised.   Orders Placed This Encounter  Procedures  . Hepatitis A hepatitis B combined vaccine IM  . POC HgB A1c  . ECHOCARDIOGRAM COMPLETE    Standing Status: Future     Number of Occurrences:      Standing Expiration Date: 07/16/2016    Order Specific Question:  Where should this test be performed    Answer:  [facility])    Order Specific Question:  Complete or Limited study?    Answer:  Complete    Order Specific Question:  With Image Enhancing Agent or without Image Enhancing Agent?    Answer:  With Image Enhancing Agent    Order Specific Question:  Reason for exam-Echo    Answer:  Palpitations  785.1 / R00.2  hepatitis A and B needed for travel to [place]

## 2015-04-16 NOTE — Assessment & Plan Note (Addendum)
S: a1c is stable today. Compliant with metformin Lab Results  Component Value Date   HGBA1C 7.3 04/16/2015  A/P: continue current metformin 500mg  daily. a1c up from 6.9 to 7.3- advised at least BID metformin but she declines. Wants to work on diet/exercise. 3.5 month follow up.

## 2015-04-16 NOTE — Assessment & Plan Note (Signed)
S: controlled. On lisinopril hctz 20-12.5mg   BP Readings from Last 3 Encounters:  04/16/15 134/78  12/16/14 150/100  09/19/14 158/94  A/P:Continue current meds:  Much improved control with hctz addition

## 2015-04-16 NOTE — Assessment & Plan Note (Signed)
S: palpitations 4-5x a day for about 20 minutes. Not having dizzy spells with this anymore. Does wonder if anxiety related A/P: we will check an echocardiogram. PAcs and PVCs on holter monitor

## 2015-04-16 NOTE — Patient Instructions (Addendum)
Travel medicine clinic [phone]. You need to schedule a nurse visit in 1 and 6 months for repeat twinrix injection. This is to cover for Hepatitis A and B. Travel medicine will help you with other needs  Blood pressure much better- no chang etoday  Diabetes up from 6.9 to 7.3. Goal is less than 7.  Wt Readings from Last 3 Encounters:  04/16/15 177 lb (80.287 kg)  12/16/14 178 lb (80.74 kg)  09/19/14 175 lb (79.379 kg)  I would set a goal by follow up for 8 lbs weight loss. We can refer to diabetes nutrition if you would like as well (sometimes covered by insurance)  We will call you within a week about your referral for echocardiogram. If you do not hear within 2 weeks, give [place] a call.

## 2015-04-16 NOTE — Assessment & Plan Note (Addendum)
S: much less frequent but still occasionally occur. Previously thought may be anxiety related. Now having palpitations regularly but not the dizzy spells- see palpitations A/P: continue to consider SSRI for anxiety- rule out ischemic disease first

## 2015-04-24 ENCOUNTER — Telehealth: Payer: Self-pay | Admitting: Family Medicine

## 2015-04-24 NOTE — Telephone Encounter (Signed)
Attempted to call patient. Voicemail box "has not been set up" and no answer from patient. Need more information before rx- could she come in Monday if she calls back?

## 2015-04-24 NOTE — Telephone Encounter (Signed)
Pt seen 2/23 and states she had a a slight sore throat, but now it is worse. Pt has mucus and sore throat. Pt states since she was just seen, she prefers not to come in. Would like to know if dr hunter will call in something for her sore throat, congestion/drainage.  [facility] [address]

## 2015-04-24 NOTE — Telephone Encounter (Signed)
See below

## 2015-04-27 NOTE — Telephone Encounter (Signed)
Unable to reach pt

## 2015-04-28 ENCOUNTER — Other Ambulatory Visit (HOSPITAL_COMMUNITY): Payer: Self-pay

## 2015-05-06 ENCOUNTER — Other Ambulatory Visit (HOSPITAL_COMMUNITY): Payer: Self-pay

## 2015-05-22 ENCOUNTER — Other Ambulatory Visit (HOSPITAL_COMMUNITY): Payer: Self-pay

## 2015-05-26 ENCOUNTER — Ambulatory Visit: Payer: Self-pay | Admitting: Family Medicine

## 2015-06-16 ENCOUNTER — Ambulatory Visit: Payer: Self-pay | Admitting: Family Medicine

## 2015-06-18 ENCOUNTER — Ambulatory Visit (HOSPITAL_COMMUNITY): Payer: Federal, State, Local not specified - PPO | Attending: Cardiology

## 2015-06-18 ENCOUNTER — Other Ambulatory Visit: Payer: Self-pay

## 2015-06-18 DIAGNOSIS — E119 Type 2 diabetes mellitus without complications: Secondary | ICD-10-CM | POA: Insufficient documentation

## 2015-06-18 DIAGNOSIS — I119 Hypertensive heart disease without heart failure: Secondary | ICD-10-CM | POA: Diagnosis not present

## 2015-06-18 DIAGNOSIS — R002 Palpitations: Secondary | ICD-10-CM | POA: Insufficient documentation

## 2015-06-18 DIAGNOSIS — E785 Hyperlipidemia, unspecified: Secondary | ICD-10-CM | POA: Diagnosis not present

## 2015-06-22 ENCOUNTER — Encounter: Payer: Self-pay | Admitting: Family Medicine

## 2015-06-22 ENCOUNTER — Ambulatory Visit (INDEPENDENT_AMBULATORY_CARE_PROVIDER_SITE_OTHER): Payer: Federal, State, Local not specified - PPO | Admitting: Family Medicine

## 2015-06-22 VITALS — BP 130/66 | HR 69 | Temp 98.1°F | Wt 173.0 lb

## 2015-06-22 DIAGNOSIS — IMO0001 Reserved for inherently not codable concepts without codable children: Secondary | ICD-10-CM

## 2015-06-22 DIAGNOSIS — E1165 Type 2 diabetes mellitus with hyperglycemia: Secondary | ICD-10-CM

## 2015-06-22 DIAGNOSIS — R002 Palpitations: Secondary | ICD-10-CM | POA: Diagnosis not present

## 2015-06-22 DIAGNOSIS — I10 Essential (primary) hypertension: Secondary | ICD-10-CM

## 2015-06-22 DIAGNOSIS — R42 Dizziness and giddiness: Secondary | ICD-10-CM

## 2015-06-22 DIAGNOSIS — Z23 Encounter for immunization: Secondary | ICD-10-CM

## 2015-06-22 DIAGNOSIS — E785 Hyperlipidemia, unspecified: Secondary | ICD-10-CM

## 2015-06-22 DIAGNOSIS — Z1211 Encounter for screening for malignant neoplasm of colon: Secondary | ICD-10-CM | POA: Diagnosis not present

## 2015-06-22 NOTE — Patient Instructions (Addendum)
Rudolpho Sevin will call you for colonoscopy  Glad the dizziness and palpitations are better. Your regular exercise as a stress reliever and weight loss tool seem to really be working. Your heart workup has been very reassuring.   For cough- would restart the flonase  Health Maintenance Due  Topic Date Due  . OPHTHALMOLOGY EXAM - need to get exam update 01/10/1965  . PAP SMEAR  01/11/1976  Twinrix today then 10/14/15 or later

## 2015-06-22 NOTE — Assessment & Plan Note (Signed)
S: poorly controlled on no statin. No myalgias.  Lab Results  Component Value Date   CHOL 234* 07/24/2014   HDL 45.20 07/24/2014   LDLDIRECT 137.0 07/24/2014   TRIG 221.0* 07/24/2014   CHOLHDL 5 07/24/2014   A/P: continue lifestyle changes, will need repeat lipids likely at tfollow up- hopeful can improve triglycerides and LDL with her lifestyle changes.

## 2015-06-22 NOTE — Progress Notes (Signed)
Subjective:  Dana Mcmahon is a 60 y.o. year old very pleasant female patient who presents for/with See problem oriented charting ROS- No chest pain or shortness of breath. No headache or blurry vision.    Past Medical History-  Patient Active Problem List   Diagnosis Date Noted  . Diabetes mellitus type II, uncontrolled ([place]) 05/10/2013    Priority: High  . Hyperlipidemia     Priority: Medium  . HTN (hypertension), benign 05/10/2013    Priority: Medium  . Allergic rhinitis 07/24/2014    Priority: Low  . Obesity, Class I, BMI 30-34.9     Priority: Low  . Dizzy spells 08/08/2013    Priority: Low  . Palpitations 05/10/2013    Priority: Low  . Osteoarthritis, hand 12/16/2014    Medications- reviewed and updated Current Outpatient Prescriptions  Medication Sig Dispense Refill  . aspirin EC 81 MG tablet Take 1 tablet (81 mg total) by mouth daily. 30 tablet 0  . fluticasone (FLONASE) 50 MCG/ACT nasal spray Place 2 sprays into both nostrils daily. 16 g 5  . lisinopril-hydrochlorothiazide (PRINZIDE,ZESTORETIC) 20-12.5 MG tablet Take 1 tablet by mouth daily. 90 tablet 3  . metFORMIN (GLUCOPHAGE) 500 MG tablet TAKE 1 TABLET BY MOUTH EVERY DAY WITH THE LARGEST MEAL 30 tablet 5  . Multiple Vitamin (MULTIVITAMIN WITH MINERALS) TABS tablet Take 1 tablet by mouth daily.     No current facility-administered medications for this visit.    Objective: BP 130/66 mmHg  Pulse 69  Temp(Src) 98.1 F (36.7 C)  Wt 173 lb (78.472 kg) Gen: NAD, resting comfortably CV: RRR no murmurs rubs or gallops Lungs: CTAB no crackles, wheeze, rhonchi Abdomen: soft/nontender/nondistended/normal bowel sounds. No rebound or guarding.  Ext: no edema Skin: warm, dry Neuro: grossly normal, moves all extremities  Assessment/Plan:  Palpitations S: Patient's palpitations are much improved down to about 3x a week when previously 4-5x a day for 20 minutes. She admits the regular exercise she has been doing from  diabetes has lightened stress load. When stress is higher has more palpitations. PACs and PVCs on holter monitor previously. We reviewed her echocardiogram results with no structural abnormalities- except some grade I diastolic dysfunction. No focal wall abnormalities to suggest prior cardiac event.   Her episodes of dizziness are also much less with perhaps 1 in last month and that was in high stress situation at work. Once again- contributes her improvement to regular exercise.   A/P: Patient now agrees with prior assumption that her dizziness and palpitations are likely stress related as much improved when able to lower her stress levels. We had considered/ordered stress test in past but given her improvement with regular activity adn fact issues do not occur with exertion- makes yield much lower.    Diabetes mellitus type II, uncontrolled ([place]) S: slightly poorly controlled. On metformin 500mg  daily at last visit, patient has nto wanted to increase medicine but has lost 4 lbs since last visit- working hard to improve diet and exercise patterns Lab Results  Component Value Date   HGBA1C 7.3 04/16/2015   HGBA1C 6.9* 07/24/2014   HGBA1C 7.7* 05/31/2013   A/P: continue lifestyle changes as well as once a day metformin- hopeful for slow improvement with these changes- if not consider increasing metformin to BID or higher dose. She will follow up for this in coming months.    Hyperlipidemia S: poorly controlled on no statin. No myalgias.  Lab Results  Component Value Date   CHOL 234* 07/24/2014  HDL 45.20 07/24/2014   LDLDIRECT 137.0 07/24/2014   TRIG 221.0* 07/24/2014   CHOLHDL 5 07/24/2014   A/P: continue lifestyle changes, will need repeat lipids likely at tfollow up- hopeful can improve triglycerides and LDL with her lifestyle changes.    Hypertension S: controlled. On lisinopril hctz 20-12.5mg   BP Readings from Last 3 Encounters:  06/22/15 130/66  04/16/15 134/78  12/16/14  150/100  A/P:Continue current meds:  No change  Return in about 3 months (around 09/22/2015) for physical and plan for pap smear. do labs a week before. ask them to add Hep C, HIV.Marland Kitchen Return precautions advised.   Orders Placed This Encounter  Procedures  . Hepatitis A hepatitis B combined vaccine IM  . Ambulatory referral to Gastroenterology    Referral Priority:  Routine    Referral Type:  Consultation    Referral Reason:  Specialty Services Required    Number of Visits Requested:  1   Health Maintenance Due  Topic Date Due  . OPHTHALMOLOGY EXAM - need to get exam update 01/10/1965  . PAP SMEAR  01/11/1976  Twinrix today then 10/14/15 or later   Garret Reddish, MD

## 2015-06-22 NOTE — Assessment & Plan Note (Signed)
S: Patient's palpitations are much improved down to about 3x a week when previously 4-5x a day for 20 minutes. She admits the regular exercise she has been doing from diabetes has lightened stress load. When stress is higher has more palpitations. PACs and PVCs on holter monitor previously. We reviewed her echocardiogram results with no structural abnormalities- except some grade I diastolic dysfunction. No focal wall abnormalities to suggest prior cardiac event.   Her episodes of dizziness are also much less with perhaps 1 in last month and that was in high stress situation at work. Once again- contributes her improvement to regular exercise.   A/P: Patient now agrees with prior assumption that her dizziness and palpitations are likely stress related as much improved when able to lower her stress levels. We had considered/ordered stress test in past but given her improvement with regular activity adn fact issues do not occur with exertion- makes yield much lower.

## 2015-06-22 NOTE — Assessment & Plan Note (Signed)
S: slightly poorly controlled. On metformin 500mg  daily at last visit, patient has nto wanted to increase medicine but has lost 4 lbs since last visit- working hard to improve diet and exercise patterns Lab Results  Component Value Date   HGBA1C 7.3 04/16/2015   HGBA1C 6.9* 07/24/2014   HGBA1C 7.7* 05/31/2013   A/P: continue lifestyle changes as well as once a day metformin- hopeful for slow improvement with these changes- if not consider increasing metformin to BID or higher dose. She will follow up for this in coming months.

## 2015-08-04 ENCOUNTER — Telehealth: Payer: Self-pay

## 2015-08-04 NOTE — Telephone Encounter (Signed)
I need to listen to her lungs, see which BP she is on and if it is lisinopril related- usually takes longer for week for resolution.   I would advise visit- we have several same days today available

## 2015-08-04 NOTE — Telephone Encounter (Signed)
Spoke with patient who states she has had a cough x2 months and now gone through 2 bottles of cough syrup with no relief. Stated she switched back to her original Lisinopril as she had the understanding that's what her cough was coming from. She switched a week ago with no noted relief. She wants to know what to do to get rid of it?

## 2015-08-11 ENCOUNTER — Telehealth: Payer: Self-pay | Admitting: Family Medicine

## 2015-08-11 LAB — HM DIABETES EYE EXAM

## 2015-08-11 NOTE — Telephone Encounter (Signed)
Pt returning your call

## 2015-08-11 NOTE — Telephone Encounter (Signed)
Returned call. Scheduled upcoming appt per Dr. Ansel Bong previous note. Patient agreed.

## 2015-08-13 ENCOUNTER — Encounter: Payer: Self-pay | Admitting: Family Medicine

## 2015-08-13 ENCOUNTER — Ambulatory Visit (INDEPENDENT_AMBULATORY_CARE_PROVIDER_SITE_OTHER): Payer: Federal, State, Local not specified - PPO | Admitting: Family Medicine

## 2015-08-13 VITALS — BP 134/86 | HR 72 | Temp 97.8°F | Ht 63.0 in | Wt 178.0 lb

## 2015-08-13 DIAGNOSIS — J3089 Other allergic rhinitis: Secondary | ICD-10-CM

## 2015-08-13 DIAGNOSIS — R053 Chronic cough: Secondary | ICD-10-CM

## 2015-08-13 DIAGNOSIS — R05 Cough: Secondary | ICD-10-CM | POA: Diagnosis not present

## 2015-08-13 MED ORDER — AMOXICILLIN-POT CLAVULANATE 875-125 MG PO TABS: 1.0000 | ORAL_TABLET | Freq: Two times a day (BID) | ORAL | Status: DC

## 2015-08-13 MED ORDER — CETIRIZINE HCL 10 MG PO CAPS: 10.0000 mg | ORAL_CAPSULE | Freq: Every day | ORAL | Status: DC

## 2015-08-13 NOTE — Patient Instructions (Signed)
Meds ordered this encounter  . Cetirizine HCl (ZYRTEC ALLERGY) 10 MG CAPS    Sig: Take 1 capsule (10 mg total) by mouth at bedtime.    Dispense:  30 capsule    Refill:  5  . amoxicillin-clavulanate (AUGMENTIN) 875-125 MG tablet    Sig: Take 1 tablet by mouth 2 (two) times daily.    Dispense:  14 tablet    Refill:  0   Take allergy medicine listed above. Take antibotic augmentin for 7 days.   Update me in 10 days how you are doing, if having persistent symptoms, I will stop the lisinopril and change it to HCTZ 25 mg then you will update me a month from then. If persistent cough at that point, get chest films- sooner if you get shortness of breath or fevers

## 2015-08-13 NOTE — Progress Notes (Signed)
Pre visit review using our clinic review tool, if applicable. No additional management support is needed unless otherwise documented below in the visit note. 

## 2015-08-13 NOTE — Progress Notes (Signed)
Subjective:  Dana Mcmahon is a 60 y.o. year old very pleasant female patient who presents for/with See problem oriented charting ROS- no fever, chills, nausea, shortness of breath.see any ROS included in HPI as well.   Past Medical History-  Patient Active Problem List   Diagnosis Date Noted  . Diabetes mellitus type II, uncontrolled ([place]) 05/10/2013    Priority: High  . Hyperlipidemia     Priority: Medium  . HTN (hypertension), benign 05/10/2013    Priority: Medium  . Allergic rhinitis 07/24/2014    Priority: Low  . Obesity, Class I, BMI 30-34.9     Priority: Low  . Dizzy spells 08/08/2013    Priority: Low  . Palpitations 05/10/2013    Priority: Low  . Osteoarthritis, hand 12/16/2014    Medications- reviewed and updated Current Outpatient Prescriptions  Medication Sig Dispense Refill  . lisinopril (PRINIVIL,ZESTRIL) 10 MG tablet Take 10 mg by mouth daily.    Marland Kitchen aspirin EC 81 MG tablet Take 1 tablet (81 mg total) by mouth daily. 30 tablet 0  . fluticasone (FLONASE) 50 MCG/ACT nasal spray Place 2 sprays into both nostrils daily. 16 g 5  . metFORMIN (GLUCOPHAGE) 500 MG tablet TAKE 1 TABLET BY MOUTH EVERY DAY WITH THE LARGEST MEAL 30 tablet 5  . Multiple Vitamin (MULTIVITAMIN WITH MINERALS) TABS tablet Take 1 tablet by mouth daily.     No current facility-administered medications for this visit.    Objective: BP 134/86 mmHg  Pulse 72  Temp(Src) 97.8 F (36.6 C) (Oral)  Ht 5\' 3"  (1.6 m)  Wt 178 lb (80.74 kg)  BMI 31.54 kg/m2  SpO2 97% Gen: NAD, resting comfortably Clear rhinorrhea noted in nares, erythematous nares, TM normal bilaterally. Pharynx with signs of drainage. Tender maxillary sinuses bilaterarlly CV: RRR no murmurs rubs or gallops Lungs: CTAB no crackles, wheeze, rhonchi Abdomen: soft/nontender/nondistended/normal bowel sounds. No rebound or guarding.  Ext: no edema Skin: warm, dry Neuro: grossly normal, moves all extremities  Assessment/Plan:  Chronic  cough S: for 2 months. 4 bottles of cough syrup without relief of robitussen. Green phlegm at times especially am- does have some sinus tenderness as well.   Saw patient 06/22/15 trialed flonase for cough of about 2 weeks. Dry cough. Back of throat draining in AM. No antihistamine. No watery/itchy eyes- some runny nose.   A/P: Will treat for potential sinusitis given continued green phlegm, sinus tenderness. Maximize allergy treatment with flonase already onboard but add zyrtec. Given thsi about a 10 day trial- if not effective then change lisinopril 10mg  to hctz 25 mg (she already stopped combo pill and fortunately BP still controlled   Meds ordered this encounter  Medications  . lisinopril (PRINIVIL,ZESTRIL) 10 MG tablet    Sig: Take 10 mg by mouth daily.  . Cetirizine HCl (ZYRTEC ALLERGY) 10 MG CAPS    Sig: Take 1 capsule (10 mg total) by mouth at bedtime.    Dispense:  30 capsule    Refill:  5  . amoxicillin-clavulanate (AUGMENTIN) 875-125 MG tablet    Sig: Take 1 tablet by mouth 2 (two) times daily.    Dispense:  14 tablet    Refill:  0    Return precautions advised. See avs Garret Reddish, MD

## 2015-08-14 ENCOUNTER — Encounter: Payer: Self-pay | Admitting: Family Medicine

## 2015-08-27 ENCOUNTER — Other Ambulatory Visit: Payer: Self-pay | Admitting: Family Medicine

## 2015-08-27 ENCOUNTER — Telehealth: Payer: Self-pay | Admitting: Family Medicine

## 2015-08-27 MED ORDER — HYDROCHLOROTHIAZIDE 25 MG PO TABS: 25.0000 mg | ORAL_TABLET | Freq: Every day | ORAL | Status: DC

## 2015-08-27 NOTE — Telephone Encounter (Signed)
I would have her stop lisinopril. I sent in hydrochlorothiazide instead. Have her follow up in 1 month with me

## 2015-08-27 NOTE — Telephone Encounter (Signed)
Called patient and was unable to speak with her or leave a message.

## 2015-08-27 NOTE — Telephone Encounter (Signed)
Pt would like dr hunter to know she still has the hacking cough.  Cough is not better. Pt has taken all the meds Dr Yong Channel advised, the abx for 7 days. Now started taking cough medicine again.  Would like to know what to do next??

## 2015-09-10 NOTE — Telephone Encounter (Signed)
Spoke with patient and conveyed message. She was upset that we just now got in touch with her. I explained that we had attempted to call her but was unable to reach her. She has stopped the lisinopril and started the HCTZ. Has an appointment tomorrow for labs and a CPE scheduled for August.

## 2015-09-11 ENCOUNTER — Other Ambulatory Visit (INDEPENDENT_AMBULATORY_CARE_PROVIDER_SITE_OTHER): Payer: Federal, State, Local not specified - PPO

## 2015-09-11 DIAGNOSIS — Z1159 Encounter for screening for other viral diseases: Secondary | ICD-10-CM

## 2015-09-11 DIAGNOSIS — Z0000 Encounter for general adult medical examination without abnormal findings: Secondary | ICD-10-CM

## 2015-09-11 DIAGNOSIS — R319 Hematuria, unspecified: Secondary | ICD-10-CM

## 2015-09-11 DIAGNOSIS — R7989 Other specified abnormal findings of blood chemistry: Secondary | ICD-10-CM | POA: Diagnosis not present

## 2015-09-11 DIAGNOSIS — Z113 Encounter for screening for infections with a predominantly sexual mode of transmission: Secondary | ICD-10-CM

## 2015-09-11 LAB — CBC WITH DIFFERENTIAL/PLATELET
Basophils Absolute: 0.1 10*3/uL (ref 0.0–0.1)
Basophils Relative: 1.1 % (ref 0.0–3.0)
EOS ABS: 0.2 10*3/uL (ref 0.0–0.7)
Eosinophils Relative: 3.6 % (ref 0.0–5.0)
HCT: 43.6 % (ref 36.0–46.0)
HEMOGLOBIN: 14.8 g/dL (ref 12.0–15.0)
Lymphocytes Relative: 48.6 % — ABNORMAL HIGH (ref 12.0–46.0)
Lymphs Abs: 2.9 10*3/uL (ref 0.7–4.0)
MCHC: 34 g/dL (ref 30.0–36.0)
MCV: 83 fl (ref 78.0–100.0)
MONO ABS: 0.6 10*3/uL (ref 0.1–1.0)
MONOS PCT: 10.6 % (ref 3.0–12.0)
NEUTROS ABS: 2.1 10*3/uL (ref 1.4–7.7)
Neutrophils Relative %: 36.1 % — ABNORMAL LOW (ref 43.0–77.0)
Platelets: 292 10*3/uL (ref 150.0–400.0)
RBC: 5.25 Mil/uL — AB (ref 3.87–5.11)
RDW: 14 % (ref 11.5–15.5)
WBC: 5.9 10*3/uL (ref 4.0–10.5)

## 2015-09-11 LAB — URINALYSIS, ROUTINE W REFLEX MICROSCOPIC
BILIRUBIN URINE: NEGATIVE
KETONES UR: NEGATIVE
NITRITE: NEGATIVE
SPECIFIC GRAVITY, URINE: 1.02 (ref 1.000–1.030)
Total Protein, Urine: NEGATIVE
Urine Glucose: NEGATIVE
Urobilinogen, UA: 0.2 (ref 0.0–1.0)
pH: 6 (ref 5.0–8.0)

## 2015-09-11 LAB — LIPID PANEL
CHOL/HDL RATIO: 5
Cholesterol: 248 mg/dL — ABNORMAL HIGH (ref 0–200)
HDL: 47.9 mg/dL (ref >39.00)
NONHDL: 199.66
Triglycerides: 308 mg/dL — ABNORMAL HIGH (ref 0.0–149.0)
VLDL: 61.6 mg/dL — ABNORMAL HIGH (ref 0.0–40.0)

## 2015-09-11 LAB — POC URINALSYSI DIPSTICK (AUTOMATED)
Bilirubin, UA: NEGATIVE
GLUCOSE UA: NEGATIVE
Ketones, UA: NEGATIVE
Nitrite, UA: NEGATIVE
Protein, UA: NEGATIVE
SPEC GRAV UA: 1.02
UROBILINOGEN UA: 0.2
pH, UA: 5.5

## 2015-09-11 LAB — HEPATIC FUNCTION PANEL
ALK PHOS: 65 U/L (ref 39–117)
ALT: 20 U/L (ref 0–35)
AST: 18 U/L (ref 0–37)
Albumin: 4.2 g/dL (ref 3.5–5.2)
BILIRUBIN DIRECT: 0.1 mg/dL (ref 0.0–0.3)
BILIRUBIN TOTAL: 0.6 mg/dL (ref 0.2–1.2)
Total Protein: 7.9 g/dL (ref 6.0–8.3)

## 2015-09-11 LAB — TSH: TSH: 5.41 u[IU]/mL — AB (ref 0.35–4.50)

## 2015-09-11 LAB — MICROALBUMIN / CREATININE URINE RATIO
Creatinine,U: 266.9 mg/dL
MICROALB UR: 5 mg/dL — AB (ref 0.0–1.9)
MICROALB/CREAT RATIO: 1.9 mg/g (ref 0.0–30.0)

## 2015-09-11 LAB — BASIC METABOLIC PANEL
BUN: 24 mg/dL — ABNORMAL HIGH (ref 6–23)
CALCIUM: 9.7 mg/dL (ref 8.4–10.5)
CO2: 29 meq/L (ref 19–32)
Chloride: 99 mEq/L (ref 96–112)
Creatinine, Ser: 0.89 mg/dL (ref 0.40–1.20)
GFR: 68.61 mL/min (ref >60.00)
GLUCOSE: 113 mg/dL — AB (ref 70–99)
Potassium: 3.1 mEq/L — ABNORMAL LOW (ref 3.5–5.1)
SODIUM: 140 meq/L (ref 135–145)

## 2015-09-11 LAB — HEMOGLOBIN A1C: HEMOGLOBIN A1C: 7 % — AB (ref 4.6–6.5)

## 2015-09-11 LAB — LDL CHOLESTEROL, DIRECT: Direct LDL: 136 mg/dL

## 2015-09-12 LAB — HIV ANTIBODY (ROUTINE TESTING W REFLEX): HIV 1&2 Ab, 4th Generation: NONREACTIVE

## 2015-09-12 LAB — HEPATITIS C ANTIBODY: HCV AB: NEGATIVE

## 2015-09-18 ENCOUNTER — Other Ambulatory Visit: Payer: Self-pay

## 2015-09-25 ENCOUNTER — Encounter: Payer: Self-pay | Admitting: Family Medicine

## 2015-09-30 ENCOUNTER — Ambulatory Visit (INDEPENDENT_AMBULATORY_CARE_PROVIDER_SITE_OTHER): Payer: Federal, State, Local not specified - PPO | Admitting: Family Medicine

## 2015-09-30 ENCOUNTER — Encounter: Payer: Self-pay | Admitting: Family Medicine

## 2015-09-30 VITALS — BP 138/72 | HR 78 | Temp 98.0°F | Ht 62.5 in | Wt 171.6 lb

## 2015-09-30 DIAGNOSIS — Z124 Encounter for screening for malignant neoplasm of cervix: Secondary | ICD-10-CM

## 2015-09-30 DIAGNOSIS — Z1211 Encounter for screening for malignant neoplasm of colon: Secondary | ICD-10-CM

## 2015-09-30 DIAGNOSIS — Z0000 Encounter for general adult medical examination without abnormal findings: Secondary | ICD-10-CM | POA: Diagnosis not present

## 2015-09-30 DIAGNOSIS — Z23 Encounter for immunization: Secondary | ICD-10-CM | POA: Diagnosis not present

## 2015-09-30 NOTE — Progress Notes (Signed)
Pre visit review using our clinic review tool, if applicable. No additional management support is needed unless otherwise documented below in the visit note. 

## 2015-09-30 NOTE — Patient Instructions (Addendum)
Pneumovax today  Get flu shot through work and update [place] when you have done this  We will call you within a week about your referral to GI and Gynecology. If you do not hear within 2 weeks, give [place] a call.   Update your mammogram

## 2015-09-30 NOTE — Progress Notes (Signed)
Phone: [phone]  Subjective:  Patient presents today for their annual physical. Chief complaint-noted.   See problem oriented charting- ROS- full  review of systems was completed and negative except for: dizziness, palpitations episodes  The following were reviewed and entered/updated in epic: Past Medical History:  Diagnosis Date  . Allergic rhinitis   . DM type 2 (diabetes mellitus, type 2) ([place])   . Hyperlipidemia    per old records, pt has refused cholesto-lowering meds  . Hypertension   . Neck pain, chronic    Mild DDD, no signif progression (multiple MRI's: 2001, 2003, 2005, 2007)  . Obesity, Class I, BMI 30-34.9   . 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 Diabetes mellitus type II, uncontrolled ([place]) 05/10/2013    Priority: High  . Hyperlipidemia     Priority: Medium  . HTN (hypertension), benign 05/10/2013    Priority: Medium  . Osteoarthritis, hand 12/16/2014    Priority: Low  . Allergic rhinitis 07/24/2014    Priority: Low  . Obesity, Class I, BMI 30-34.9     Priority: Low  . Dizzy spells 08/08/2013    Priority: Low  . Palpitations 05/10/2013    Priority: Low   Past Surgical History:  Procedure Laterality Date  . none      Family History  Problem Relation Age of Onset  . Stroke Mother     66s, and father 34s  . Hypertension Mother     and father  . Stroke Brother     in 56s    Medications- reviewed and updated Current Outpatient Prescriptions  Medication Sig Dispense Refill  . aspirin EC 81 MG tablet Take 1 tablet (81 mg total) by mouth daily. 30 tablet 0  . Cetirizine HCl (ZYRTEC ALLERGY) 10 MG CAPS Take 1 capsule (10 mg total) by mouth at bedtime. 30 capsule 5  . fluticasone (FLONASE) 50 MCG/ACT nasal spray Place 2 sprays into both nostrils daily. 16 g 5  . hydrochlorothiazide (HYDRODIURIL) 25 MG tablet Take 1 tablet (25 mg total) by mouth daily. 30 tablet 5  . metFORMIN (GLUCOPHAGE)  500 MG tablet TAKE 1 TABLET BY MOUTH EVERY DAY WITH THE LARGEST MEAL 30 tablet 5  . Multiple Vitamin (MULTIVITAMIN WITH MINERALS) TABS tablet Take 1 tablet by mouth daily.     No current facility-administered medications for this visit.     Allergies-reviewed and updated Allergies  Allergen Reactions  . Codeine Nausea Only  . Metformin And Related Other (See Comments)    GI intolerance    Social History   Social History  . Marital status: Married    Spouse name: N/A  . Number of children: N/A  . Years of education: N/A   Social History Main Topics  . Smoking status: Never Smoker  . Smokeless tobacco: None  . Alcohol use No  . Drug use: No  . Sexual activity: Not Asked   Other Topics Concern  . None   Social History Narrative   Married, 4 children (daughter Charisma patient here, husband Patsy [place], son Patsy [place]). 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      Hobbies: time with family, watching news, reading                Objective: BP 138/72   Pulse 78   Temp  98 F (36.7 C) (Oral)   Ht 5' 2.5" (1.588 m)   Wt 171 lb 9.6 oz (77.8 kg)   SpO2 97%   BMI 30.89 kg/m  Gen: NAD, resting comfortably HEENT: Mucous membranes are moist. Oropharynx normal Neck: no thyromegaly CV: RRR no murmurs rubs or gallops Lungs: CTAB no crackles, wheeze, rhonchi Abdomen: soft/nontender/nondistended/normal bowel sounds. No rebound or guarding.  Ext: no edema Skin: warm, dry Neuro: grossly normal, moves all extremities, PERRLA  Diabetic Foot Exam - Simple   Simple Foot Form Diabetic Foot exam was performed with the following findings:  Yes 09/30/2015 11:03 AM  Visual Inspection No deformities, no ulcerations, no other skin breakdown bilaterally:  Yes Sensation Testing Intact to touch and monofilament testing bilaterally:  Yes Pulse Check Posterior Tibialis and Dorsalis pulse intact bilaterally:   Yes Comments      Assessment/Plan:  60 y.o. female presenting for annual physical.  Health Maintenance counseling: 1. Anticipatory guidance: Patient counseled regarding regular dental exams, eye exams, wearing seatbelts.  2. Risk factor reduction:  Advised patient of need for regular exercise and diet rich and fruits and vegetables to reduce risk of heart attack and stroke. Has been exercising but less over less 3 weeks due to hot. Down 7 lbs from peak recently- has improved eating habits Wt Readings from Last 3 Encounters:  09/30/15 171 lb 9.6 oz (77.8 kg)  08/13/15 178 lb (80.7 kg)  06/22/15 173 lb (78.5 kg)  3. Immunizations/screenings/ancillary studies Immunization History  Administered Date(s) Administered  . Hep A / Hep B 04/16/2015, 06/22/2015  . Influenza-Unspecified 11/26/2014  . Tdap 07/24/2014   Health Maintenance Due  Topic Date Due  . PNEUMOCOCCAL POLYSACCHARIDE VACCINE (1)- today  01/10/1957  . PAP SMEAR - referred today 01/11/1976  . COLONOSCOPY -  refer today 01/10/2005  . ZOSTAVAX - offered , consider future visit 01/11/2015  . FOOT EXAM - today normal 07/24/2015  . INFLUENZA VACCINE - advised to get this once available 09/22/2015   4. Cervical cancer screening- refer today 5. Breast cancer screening-  breast exam through gynecology referral and mammogram 10/06/14 normal- advised repeat 6. Colon cancer screening - needs colonoscopy, refer  Status of chronic or acute concerns  Diabetes on metformin 500mg , now controlled Lab Results  Component Value Date   HGBA1C 7.0 (H) 09/11/2015   HLD- refuses statin though advise with triglycerides and LDL Lab Results  Component Value Date   CHOL 248 (H) 09/11/2015   HDL 47.90 09/11/2015   LDLDIRECT 136.0 09/11/2015   TRIG 308.0 (H) 09/11/2015   CHOLHDL 5 09/11/2015   HTN-  controlled on HCTZ  25mg  but near border- off ace I due to cough= improved BP Readings from Last 3 Encounters:  09/30/15 138/72  08/13/15  134/86  06/22/15 130/66   Chronic cough- continuing flonase and zyrtec helps some. Better off lisinopril as well. Phlegm in AM.  Still has palpitations, dizziness due to anxiety which stops her from being able to do any significant work- 4x a month for about 4 hours- responds when she rests and eases mind  4 months verbal. Will fill out fmla. At end of visit asks for dermatology recs- mentioned [facility] dermatology for itchy spot on right cheek which appeared to be seborrheic keratosis  Orders Placed This Encounter  Procedures  . Ambulatory referral to Gynecology    Referral Priority:   Routine    Referral Type:   Consultation    Referral Reason:   Specialty Services Required  Requested Specialty:   Gynecology    Number of Visits Requested:   1  . Ambulatory referral to Gastroenterology    Referral Priority:   Routine    Referral Type:   Consultation    Referral Reason:   Specialty Services Required    Number of Visits Requested:   1    No orders of the defined types were placed in this encounter.   Return precautions advised.   Garret Reddish, MD

## 2015-09-30 NOTE — Addendum Note (Signed)
Addended by: Mariam Dollar, Roselyn Reef M on: 09/30/2015 11:11 AM   Modules accepted: Orders

## 2015-10-01 ENCOUNTER — Telehealth: Payer: Self-pay | Admitting: Obstetrics and Gynecology

## 2015-10-01 NOTE — Telephone Encounter (Signed)
Called and left a message for patient to call back to schedule a new patient doctor referral. °

## 2015-10-02 ENCOUNTER — Other Ambulatory Visit: Payer: Self-pay | Admitting: Family Medicine

## 2015-10-02 ENCOUNTER — Telehealth: Payer: Self-pay | Admitting: Family Medicine

## 2015-10-02 NOTE — Telephone Encounter (Signed)
Pt states the paperwork Dr Yong Channel filled out in in complete. Her son is on the way here to have it corrected.  Pt put a note on it informing what is missing.  Pt would like you to call her afterwards.

## 2015-10-02 NOTE — Telephone Encounter (Signed)
Spoke with patient who is having her son come by and pick up her paperwork

## 2015-10-06 NOTE — Telephone Encounter (Signed)
Called but did not leave a message due to "VM not set up."

## 2015-10-07 NOTE — Telephone Encounter (Signed)
Called but did not leave a message due to no voicemail.

## 2015-10-12 NOTE — Telephone Encounter (Signed)
Routed referral back to referring office for follow up due to no reply from patient.

## 2015-10-15 ENCOUNTER — Telehealth: Payer: Self-pay | Admitting: Obstetrics and Gynecology

## 2015-10-20 NOTE — Telephone Encounter (Signed)
Routed referral back to referring office due to no returned calls.

## 2015-10-23 ENCOUNTER — Other Ambulatory Visit: Payer: Self-pay

## 2015-11-05 ENCOUNTER — Ambulatory Visit: Payer: Federal, State, Local not specified - PPO | Admitting: Certified Nurse Midwife

## 2015-11-06 ENCOUNTER — Encounter: Payer: Self-pay | Admitting: Family Medicine

## 2015-11-30 ENCOUNTER — Encounter: Payer: Self-pay | Admitting: Obstetrics and Gynecology

## 2015-11-30 ENCOUNTER — Ambulatory Visit (INDEPENDENT_AMBULATORY_CARE_PROVIDER_SITE_OTHER): Payer: Federal, State, Local not specified - PPO | Admitting: Obstetrics and Gynecology

## 2015-11-30 VITALS — BP 124/79 | HR 64 | Resp 14 | Ht 62.5 in | Wt 171.0 lb

## 2015-11-30 DIAGNOSIS — L219 Seborrheic dermatitis, unspecified: Secondary | ICD-10-CM | POA: Diagnosis not present

## 2015-11-30 DIAGNOSIS — Z0000 Encounter for general adult medical examination without abnormal findings: Secondary | ICD-10-CM

## 2015-11-30 DIAGNOSIS — Z124 Encounter for screening for malignant neoplasm of cervix: Secondary | ICD-10-CM | POA: Diagnosis not present

## 2015-11-30 DIAGNOSIS — L821 Other seborrheic keratosis: Secondary | ICD-10-CM | POA: Diagnosis not present

## 2015-11-30 DIAGNOSIS — R195 Other fecal abnormalities: Secondary | ICD-10-CM

## 2015-11-30 DIAGNOSIS — Z01419 Encounter for gynecological examination (general) (routine) without abnormal findings: Secondary | ICD-10-CM

## 2015-11-30 DIAGNOSIS — D1800 Hemangioma unspecified site: Secondary | ICD-10-CM | POA: Diagnosis not present

## 2015-11-30 DIAGNOSIS — Z1211 Encounter for screening for malignant neoplasm of colon: Secondary | ICD-10-CM | POA: Diagnosis not present

## 2015-11-30 LAB — POC HEMOCCULT BLD/STL (OFFICE/1-CARD/DIAGNOSTIC): Fecal Occult Blood, POC: POSITIVE — AB

## 2015-11-30 NOTE — Progress Notes (Signed)
60 y.o. [id number] Married [place] F here for annual exam.  No bleeding, not sexually active.     No LMP recorded. Patient is postmenopausal.          Sexually active: No.  The current method of family planning is post menopausal status.    Exercising: Yes.    home exercises/ dancing  Smoker:  no  Health Maintenance: Pap:  2014 WNL per patient  History of abnormal Pap:  no MMG:  10-03-14 WNL  Colonoscopy:  Never, plans to schedule  BMD:   Never TDaP:  07-24-14 Gardasil: N/A   reports that she has never smoked. She has never used smokeless tobacco. 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 4 kids, 20-30. Oldest son is in the [place] and is getting married. Youngest still at home.   Past Medical History:  Diagnosis Date  . Allergic rhinitis   . Anxiety   . DM type 2 (diabetes mellitus, type 2) ([place])   . Hyperlipidemia    per old records, pt has refused cholesto-lowering meds  . Hypertension   . Neck pain, chronic    Mild DDD, no signif progression (multiple MRI's: 2001, 2003, 2005, 2007)  . Obesity, Class I, BMI 30-34.9   . 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 none      Current Outpatient Prescriptions  Medication Sig Dispense Refill  . aspirin EC 81 MG tablet Take 1 tablet (81 mg total) by mouth daily. 30 tablet 0  . Cetirizine HCl (ZYRTEC ALLERGY) 10 MG CAPS Take 1 capsule (10 mg total) by mouth at bedtime. 30 capsule 5  . fluticasone (FLONASE) 50 MCG/ACT nasal spray Place 2 sprays into both nostrils daily. 16 g 5  . hydrochlorothiazide (HYDRODIURIL) 25 MG tablet Take 1 tablet (25 mg total) by mouth daily. 30 tablet 5  . Multiple Vitamin (MULTIVITAMIN WITH MINERALS) TABS tablet Take 1 tablet by mouth daily.    . metFORMIN (GLUCOPHAGE) 500 MG tablet TAKE 1 TABLET BY MOUTH EVERY DAY WITH THE LARGEST MEAL  30 tablet 5   No current facility-administered medications for this visit.     Family History  Problem Relation Age of Onset  . Stroke Mother     32s, and father 77s  . Hypertension Mother     and father  . Stroke Brother     in 7s    Review of Systems  Constitutional: Negative.   HENT: Negative.   Eyes: Negative.   Respiratory: Negative.   Cardiovascular: Negative.   Gastrointestinal: Negative.   Endocrine: Negative.   Genitourinary: Negative.   Musculoskeletal: Negative.   Skin: Negative.   Allergic/Immunologic: Negative.   Neurological: Negative.   Psychiatric/Behavioral: Negative.     Exam:   BP 124/79 (BP Location: Right Arm, Patient Position: Sitting, Cuff Size: Normal)   Pulse 64   Resp 14   Ht 5' 2.5" (1.588 m)   Wt 171 lb (77.6 kg)   BMI 30.78 kg/m   Weight change: @WEIGHTCHANGE @ Height:   Height: 5' 2.5" (158.8 cm)  Ht Readings from Last 3 Encounters:  11/30/15 5' 2.5" (1.588 m)  09/30/15 5' 2.5" (1.588 m)  08/13/15 5\' 3"  (1.6 m)    General appearance: alert, cooperative and appears stated age Head: Normocephalic, without obvious abnormality, atraumatic Neck: no  adenopathy, supple, symmetrical, trachea midline and thyroid normal to inspection and palpation Lungs: clear to auscultation bilaterally Breasts: normal appearance, no masses or tenderness Heart: regular rate and rhythm Abdomen: soft, non-tender; bowel sounds normal; no masses,  no organomegaly Extremities: extremities normal, atraumatic, no cyanosis or edema Skin: Skin color, texture, turgor normal. No rashes or lesions Lymph nodes: Cervical, supraclavicular, and axillary nodes normal. No abnormal inguinal nodes palpated Neurologic: Grossly normal   Pelvic: External genitalia:  no lesions              Urethra:  normal appearing urethra with no masses, tenderness or lesions              Bartholins and Skenes: normal                 Vagina: normal appearing vagina with normal color and  discharge, no lesions              Cervix: no lesions               Bimanual Exam:  Uterus:  normal size, contour, position, consistency, mobility, non-tender              Adnexa: no mass, fullness, tenderness               Rectovaginal: Confirms, stool Heme + (faintly)               Anus:  normal sphincter tone, no lesions  Chaperone was present for exam.  A:  Well Woman with normal exam  Heme + stool, she has never had a colonoscopy  P:   Pap with hpv  Labs and immunizations with primary, she will get a vit D level with her primary  Discussed breast self exam  Discussed calcium and vit D intake  She will schedule a mammogram  Will schedule an appointment with GI for her. She has worked with Dr Collene Mares in the past and would like to see her. Referral made

## 2015-11-30 NOTE — Patient Instructions (Signed)

## 2015-12-02 ENCOUNTER — Telehealth: Payer: Self-pay | Admitting: Obstetrics and Gynecology

## 2015-12-02 ENCOUNTER — Encounter: Payer: Self-pay | Admitting: Obstetrics and Gynecology

## 2015-12-02 LAB — IPS PAP TEST WITH HPV

## 2015-12-02 NOTE — Telephone Encounter (Signed)
Call to patient to notify referral to Dr Collene Mares for gastroenterology consult for Heme + stool and colonoscopy. Appointment scheduled for 12/03/15. Patient declined appointment and asked to reschedule.   Patient also had questions regarding reason for referral. I reviewed reason and she stated she doesn't remember any such test. I reviewed chart notes stating patient aware per Dr Talbert Nan.  Patient requests call at 430 or after due to work to discuss.   Call to Dr Lorie Apley office to reschedule appointment. Appointment rescheduled for 12/17/15 @ 2pm. Scheduler asked to relay that patient chronically cancels or no shows for appointments with their office for consults/ follow ups and colonoscopies. Further no shows will result in dismissal from their practice.

## 2015-12-02 NOTE — Telephone Encounter (Signed)
Return call to patient, no answer and no voicemail set-up.

## 2015-12-04 NOTE — Telephone Encounter (Signed)
Unable to leave message, no voicemail set up.

## 2015-12-09 ENCOUNTER — Telehealth: Payer: Self-pay | Admitting: Family Medicine

## 2015-12-09 ENCOUNTER — Encounter: Payer: Self-pay | Admitting: Family Medicine

## 2015-12-09 NOTE — Telephone Encounter (Signed)
Unable to leave message, no voicemail.    Dr. Talbert Nan, please advise? Attempted to contact x3.

## 2015-12-09 NOTE — Telephone Encounter (Signed)
Pt would like a referral to someone concerning her ongoing cough.

## 2015-12-09 NOTE — Telephone Encounter (Signed)
Pt would like jaime to return her call concerning a cough

## 2015-12-09 NOTE — Telephone Encounter (Signed)
Please send a letter to the patient with the information (include the information about no shows). Thanks

## 2015-12-10 NOTE — Telephone Encounter (Signed)
Yes thanks, can refer to pulmonary Dr. Melvyn Novas under chronic cough

## 2015-12-11 ENCOUNTER — Other Ambulatory Visit: Payer: Self-pay

## 2015-12-11 DIAGNOSIS — R053 Chronic cough: Secondary | ICD-10-CM

## 2015-12-11 DIAGNOSIS — R05 Cough: Secondary | ICD-10-CM

## 2015-12-11 NOTE — Telephone Encounter (Signed)
Referral to pulmonology placed. Patient is aware

## 2015-12-11 NOTE — Progress Notes (Signed)
Encounter opened in error

## 2015-12-11 NOTE — Telephone Encounter (Signed)
Dr. Talbert Nan, Kerry Hough sent letter to patient dated 12/02/15 detailing appointment dates, times, contact info, and cancellation details for the referral to Dr. Collene Mares. Would you like another letter mailed?

## 2015-12-14 NOTE — Telephone Encounter (Signed)
Fine, thanks. Will close the encounter.

## 2015-12-29 ENCOUNTER — Encounter: Payer: Self-pay | Admitting: Gastroenterology

## 2016-01-06 ENCOUNTER — Other Ambulatory Visit: Payer: Self-pay | Admitting: Family Medicine

## 2016-01-06 ENCOUNTER — Ambulatory Visit: Payer: Self-pay | Admitting: Family Medicine

## 2016-01-06 DIAGNOSIS — Z1231 Encounter for screening mammogram for malignant neoplasm of breast: Secondary | ICD-10-CM

## 2016-01-12 ENCOUNTER — Ambulatory Visit
Admission: RE | Admit: 2016-01-12 | Discharge: 2016-01-12 | Disposition: A | Discharge status: Home / Self Care | Payer: Federal, State, Local not specified - PPO | Source: Ambulatory Visit | Attending: Family Medicine | Admitting: Family Medicine

## 2016-01-12 DIAGNOSIS — Z1231 Encounter for screening mammogram for malignant neoplasm of breast: Secondary | ICD-10-CM | POA: Diagnosis not present

## 2016-01-19 ENCOUNTER — Other Ambulatory Visit: Payer: Self-pay | Admitting: Family Medicine

## 2016-01-22 ENCOUNTER — Ambulatory Visit (INDEPENDENT_AMBULATORY_CARE_PROVIDER_SITE_OTHER): Payer: Federal, State, Local not specified - PPO | Admitting: Internal Medicine

## 2016-01-22 ENCOUNTER — Other Ambulatory Visit (INDEPENDENT_AMBULATORY_CARE_PROVIDER_SITE_OTHER): Payer: Federal, State, Local not specified - PPO

## 2016-01-22 ENCOUNTER — Other Ambulatory Visit: Payer: Self-pay | Admitting: Internal Medicine

## 2016-01-22 ENCOUNTER — Ambulatory Visit (INDEPENDENT_AMBULATORY_CARE_PROVIDER_SITE_OTHER)
Admission: RE | Admit: 2016-01-22 | Discharge: 2016-01-22 | Disposition: A | Discharge status: Home / Self Care | Payer: Federal, State, Local not specified - PPO | Source: Ambulatory Visit | Attending: Internal Medicine | Admitting: Internal Medicine

## 2016-01-22 ENCOUNTER — Encounter: Payer: Self-pay | Admitting: Internal Medicine

## 2016-01-22 VITALS — BP 144/90 | HR 78 | Ht 63.5 in | Wt 171.0 lb

## 2016-01-22 DIAGNOSIS — R05 Cough: Secondary | ICD-10-CM | POA: Diagnosis not present

## 2016-01-22 DIAGNOSIS — I10 Essential (primary) hypertension: Secondary | ICD-10-CM | POA: Diagnosis not present

## 2016-01-22 DIAGNOSIS — R058 Other specified cough: Secondary | ICD-10-CM

## 2016-01-22 LAB — CBC WITH DIFFERENTIAL/PLATELET
Basophils Absolute: 0 10*3/uL (ref 0.0–0.1)
Basophils Relative: 0.3 % (ref 0.0–3.0)
EOS ABS: 0.2 10*3/uL (ref 0.0–0.7)
EOS PCT: 3.3 % (ref 0.0–5.0)
HCT: 42 % (ref 36.0–46.0)
Hemoglobin: 14.5 g/dL (ref 12.0–15.0)
LYMPHS ABS: 2.3 10*3/uL (ref 0.7–4.0)
Lymphocytes Relative: 36.6 % (ref 12.0–46.0)
MCHC: 34.5 g/dL (ref 30.0–36.0)
MCV: 83.1 fl (ref 78.0–100.0)
MONO ABS: 0.5 10*3/uL (ref 0.1–1.0)
Monocytes Relative: 7.3 % (ref 3.0–12.0)
NEUTROS PCT: 52.5 % (ref 43.0–77.0)
Neutro Abs: 3.3 10*3/uL (ref 1.4–7.7)
Platelets: 299 10*3/uL (ref 150.0–400.0)
RBC: 5.06 Mil/uL (ref 3.87–5.11)
RDW: 14 % (ref 11.5–15.5)
WBC: 6.3 10*3/uL (ref 4.0–10.5)

## 2016-01-22 MED ORDER — FAMOTIDINE 20 MG PO TABS: ORAL_TABLET | ORAL | 2 refills | Status: DC

## 2016-01-22 MED ORDER — BENZONATATE 200 MG PO CAPS: 200.0000 mg | ORAL_CAPSULE | Freq: Three times a day (TID) | ORAL | 1 refills | Status: DC | PRN

## 2016-01-22 MED ORDER — PANTOPRAZOLE SODIUM 40 MG PO TBEC: 40.0000 mg | DELAYED_RELEASE_TABLET | Freq: Every day | ORAL | 2 refills | Status: DC

## 2016-01-22 MED ORDER — PREDNISONE 10 MG PO TABS: ORAL_TABLET | ORAL | 0 refills | Status: DC

## 2016-01-22 NOTE — Patient Instructions (Addendum)
Please remember to go to the lab and x-ray department downstairs for your tests - we will call you with the results when they are available.  Prednisone 10 mg take  4 each am x 2 days,   2 each am x 2 days,  1 each am x 2 days and stop   For drainage / throat tickle stop zyrtec try take CHLORPHENIRAMINE  4 mg - take one every 4 hours as needed - available over the counter- may cause drowsiness so start with just a bedtime dose or two and see how you tolerate it before trying in daytime    For cough > tessalon 200 mg three times  Daily as needed   Pantoprazole (protonix) 40 mg   Take  30-60 min before first meal of the day and Pepcid (famotidine)  20 mg one @  bedtime until return to office - this is the best way to tell whether stomach acid is contributing to your problem.    GERD (REFLUX)  is an extremely common cause of respiratory symptoms just like yours , many times with no obvious heartburn at all.    It can be treated with medication, but also with lifestyle changes including elevation of the head of your bed (ideally with 6 inch  bed blocks),  Smoking cessation, avoidance of late meals, excessive alcohol, and avoid fatty foods, chocolate, peppermint, colas, red wine, and acidic juices such as orange juice.  NO MINT OR MENTHOL PRODUCTS SO NO COUGH DROPS   USE SUGARLESS CANDY INSTEAD (Jolley ranchers or Stover's or Life Savers) or even ice chips will also do - the key is to swallow to prevent all throat clearing. NO OIL BASED VITAMINS - use powdered substitutes.  Return to this office with all your medications after you return from the [place]

## 2016-01-22 NOTE — Progress Notes (Signed)
Subjective:    Patient ID: Dana Mcmahon, female    DOB: [birth year],  MRN: [id number]  HPI  60 yo  [place] female never smoker in [place] since around 1988 with onset  in 2014  of sense of pnds/cough while on ACEi    but stopped it in 08/13/15 but did not  Improve  so referred to pulmonary clinic 01/22/2016 by Dr   Dana Mcmahon   01/22/2016 1st [facility] office visit/ Dana Mcmahon   Chief Complaint  Patient presents with  . Pulmonary Consult    Referred by Dr. Yong Mcmahon. Pt c/o cough for the past 3 months. Cough is prod in the am's with yellow/red sputum.    cough is same since stopping lisinopril ? In 07/2015  Doesn't keep her up at hs/ really min am sputum, the rest of day dry hacking  pnds no better on zyrtec  Better with candy /water Worse with voice use  No obvious other patterns in day to day or daytime variabilty or assoc sob  or cp or chest tightness, subjective wheeze overt sinus or hb symptoms. No unusual exp hx or h/o childhood pna/ asthma or knowledge of premature birth.  Sleeping ok without nocturnal  or early am exacerbation  of respiratory  c/o's or need for noct saba. Also denies any obvious fluctuation of symptoms with weather or environmental changes or other aggravating or alleviating factors except as outlined above   Current Medications, Allergies, Complete Past Medical History, Past Surgical History, Family History, and Social History were reviewed in [employer] record.              Review of Systems  Constitutional: Negative for chills, fever and unexpected weight change.  HENT: Positive for dental problem, postnasal drip and trouble swallowing. Negative for congestion, ear pain, nosebleeds, rhinorrhea, sinus pressure, sneezing, sore throat and voice change.   Eyes: Negative for visual disturbance.  Respiratory: Positive for cough. Negative for choking and shortness of breath.   Cardiovascular: Negative for chest pain and leg swelling.    Gastrointestinal: Negative for abdominal pain, diarrhea and vomiting.       Indigestion  Genitourinary: Negative for difficulty urinating.  Musculoskeletal: Negative for arthralgias.  Skin: Negative for rash.  Neurological: Positive for headaches. Negative for tremors and syncope.  Hematological: Does not bruise/bleed easily.       Objective:   Physical Exam  amb phiip[place] female with freq throat clearing  Wt Readings from Last 3 Encounters:  01/22/16 171 lb (77.6 kg)  11/30/15 171 lb (77.6 kg)  09/30/15 171 lb 9.6 oz (77.8 kg)    Vital signs reviewed - bp 140/90 noted/ stas 95% RA   HEENT: nl dentition, turbinates, and oropharynx. Nl external ear canals without cough reflex   NECK :  without JVD/Nodes/TM/ nl carotid upstrokes bilaterally   LUNGS: no acc muscle use,  Nl contour chest which is clear to A and P bilaterally without cough on insp or exp maneuvers   CV:  RRR  no s3 or murmur or increase in P2, nad no edema   ABD:  soft and nontender with nl inspiratory excursion in the supine position. No bruits or organomegaly appreciated, bowel sounds nl  MS:  Nl gait/ ext warm without deformities, calf tenderness, cyanosis or clubbing No obvious joint restrictions   SKIN: warm and dry without lesions    NEURO:  alert, approp, nl sensorium with  no motor or cerebellar deficits apparent.    CXR PA  and Lateral:   01/22/2016 :    I personally reviewed images and agree with radiology impression as follows:   No active cardiopulmonary disease.   Labs ordered 01/22/2016  Allergy profile     Assessment & Plan:

## 2016-01-23 NOTE — Assessment & Plan Note (Signed)
The most common causes of chronic cough in immunocompetent adults include the following: upper airway cough syndrome (UACS), previously referred to as postnasal drip syndrome (PNDS), which is caused by variety of rhinosinus conditions; (2) asthma; (3) GERD; (4) chronic bronchitis from cigarette smoking or other inhaled environmental irritants; (5) nonasthmatic eosinophilic bronchitis; and (6) bronchiectasis.   These conditions, singly or in combination, have accounted for up to 94% of the causes of chronic cough in prospective studies.   Other conditions have constituted no >6% of the causes in prospective studies These have included bronchogenic carcinoma, chronic interstitial pneumonia, sarcoidosis, left ventricular failure, ACEI-induced cough, and aspiration from a condition associated with pharyngeal dysfunction.    Chronic cough is often simultaneously caused by more than one condition. A single cause has been found from 38 to 82% of the time, multiple causes from 18 to 62%. Multiply caused cough has been the result of three diseases up to 42% of the time.       Based on hx and exam, this is most likely:  Classic Upper airway cough syndrome, so named because it's frequently impossible to sort out how much is  CR/sinusitis with freq throat clearing (which can be related to primary GERD)   vs  causing  secondary (" extra esophageal")  GERD from wide swings in gastric pressure that occur with throat clearing, often  promoting self use of mint and menthol lozenges that reduce the lower esophageal sphincter tone and exacerbate the problem further in a cyclical fashion.   These are the same pts (now being labeled as having "irritable larynx syndrome" by some cough centers) who not infrequently have a history of having failed to tolerate ace inhibitors(which was likely the case here) ,  dry powder inhalers or biphosphonates or report having atypical reflux symptoms that don't respond to standard doses of  PPI , and are easily confused as having aecopd or asthma flares by even experienced allergists/ pulmonologists.   The first step is to maximize acid suppression and eliminate cyclical coughing then regroup if the cough persists when she returns from the [place] with sinus ct and Methacholine challenge testing next if cough persists   Total time devoted to counseling  > 50 % of 60 minoffice visit:  review case with pt/ discussion of options/alternatives/ personally creating written customized instructions  in presence of pt  then going over those specific  Instructions directly with the pt including how to use all of the meds but in particular covering each new medication in detail and the difference between the maintenance/automatic meds and the prns using an action plan format for the latter.  Please see AVS from this visit for a full list of these instructions

## 2016-01-23 NOTE — Assessment & Plan Note (Signed)
Lisinopril 20- hctz 12.5mg  d/c'd 08/13/2015 due to cough   Marginally Adequate control on present rx, reviewed in detail with pt > no change in rx needed  For now but Follow up per Primary Care planned    .Although even in retrospect it may not be clear the ACEi contributed to the pt's symptoms,   adding them back at this point or in the future would risk confusion in interpretation of non-specific respiratory symptoms to which this patient is prone  ie  Better not to muddy the waters here.    If use ARB, would prefer avapro or diovan over generic cozar which has been assoc with reports of cough.

## 2016-01-25 LAB — RESPIRATORY ALLERGY PROFILE REGION II ~~LOC~~
Allergen, Cedar tree, t12: <0.1 kU/L
Allergen, Cottonwood, t14: <0.1 kU/L
Allergen, Mouse Urine Protein, e78: <0.1 kU/L
Allergen, P. notatum, m1: <0.1 kU/L
Aspergillus fumigatus, m3: <0.1 kU/L
Bermuda Grass: <0.1 kU/L
COCKROACH: 0.11 kU/L — AB
Cat Dander: <0.1 kU/L
Common Ragweed: <0.1 kU/L
Dog Dander: <0.1 kU/L
IGE (IMMUNOGLOBULIN E), SERUM: 101 kU/L (ref <115)
Pecan/Hickory Tree IgE: 2.49 kU/L — ABNORMAL HIGH
Timothy Grass: <0.1 kU/L

## 2016-01-25 NOTE — Progress Notes (Signed)
ATC, NA and no option to leave msg 

## 2016-01-26 NOTE — Progress Notes (Signed)
Spoke with pt and notified of results per Dr. Melvyn Novas. Pt verbalized understanding and denied any questions. Will call for ov when returns home from the [facility]

## 2016-01-26 NOTE — Progress Notes (Signed)
Spoke with pt and notified of results per Dr. Melvyn Novas. Pt verbalized understanding and denied any questions. She states will call for f/u after she returns home from the [facility]

## 2016-01-29 ENCOUNTER — Ambulatory Visit (INDEPENDENT_AMBULATORY_CARE_PROVIDER_SITE_OTHER): Payer: Federal, State, Local not specified - PPO | Admitting: Family Medicine

## 2016-01-29 ENCOUNTER — Encounter: Payer: Self-pay | Admitting: Family Medicine

## 2016-01-29 VITALS — BP 154/104 | HR 80 | Temp 98.2°F | Ht 63.5 in | Wt 170.2 lb

## 2016-01-29 DIAGNOSIS — Z23 Encounter for immunization: Secondary | ICD-10-CM

## 2016-01-29 NOTE — Progress Notes (Signed)
Pre visit review using our clinic review tool, if applicable. No additional management support is needed unless otherwise documented below in the visit note. 

## 2016-01-31 NOTE — Progress Notes (Signed)
Patient arrived late for appointment due to snow. Her appointment was delayed as a result and she had to return to work. She received her twinrix and opted to reschedule.

## 2016-03-18 ENCOUNTER — Encounter: Payer: Self-pay | Admitting: Gastroenterology

## 2016-03-18 ENCOUNTER — Ambulatory Visit: Payer: Federal, State, Local not specified - PPO | Admitting: Family Medicine

## 2016-04-15 ENCOUNTER — Ambulatory Visit (INDEPENDENT_AMBULATORY_CARE_PROVIDER_SITE_OTHER): Payer: Federal, State, Local not specified - PPO | Admitting: Family Medicine

## 2016-04-15 ENCOUNTER — Encounter: Payer: Self-pay | Admitting: Family Medicine

## 2016-04-15 VITALS — BP 154/78 | HR 77 | Temp 98.4°F | Ht 63.5 in | Wt 169.6 lb

## 2016-04-15 DIAGNOSIS — E785 Hyperlipidemia, unspecified: Secondary | ICD-10-CM

## 2016-04-15 DIAGNOSIS — I10 Essential (primary) hypertension: Secondary | ICD-10-CM

## 2016-04-15 DIAGNOSIS — R002 Palpitations: Secondary | ICD-10-CM

## 2016-04-15 DIAGNOSIS — R7989 Other specified abnormal findings of blood chemistry: Secondary | ICD-10-CM

## 2016-04-15 DIAGNOSIS — IMO0001 Reserved for inherently not codable concepts without codable children: Secondary | ICD-10-CM

## 2016-04-15 DIAGNOSIS — R946 Abnormal results of thyroid function studies: Secondary | ICD-10-CM | POA: Diagnosis not present

## 2016-04-15 DIAGNOSIS — E1165 Type 2 diabetes mellitus with hyperglycemia: Secondary | ICD-10-CM | POA: Diagnosis not present

## 2016-04-15 LAB — HEPATIC FUNCTION PANEL
ALK PHOS: 61 U/L (ref 33–130)
ALT: 17 U/L (ref 6–29)
AST: 15 U/L (ref 10–35)
Albumin: 4.1 g/dL (ref 3.6–5.1)
BILIRUBIN DIRECT: 0.1 mg/dL (ref <=0.2)
BILIRUBIN INDIRECT: 0.2 mg/dL (ref 0.2–1.2)
TOTAL PROTEIN: 6.9 g/dL (ref 6.1–8.1)
Total Bilirubin: 0.3 mg/dL (ref 0.2–1.2)

## 2016-04-15 LAB — BASIC METABOLIC PANEL WITH GFR
BUN: 13 mg/dL (ref 7–25)
CO2: 25 mmol/L (ref 20–31)
Calcium: 9.1 mg/dL (ref 8.6–10.4)
Chloride: 103 mmol/L (ref 98–110)
Creat: 0.73 mg/dL (ref 0.50–0.99)
Glucose, Bld: 132 mg/dL — ABNORMAL HIGH (ref 65–99)
Potassium: 3.6 mmol/L (ref 3.5–5.3)
Sodium: 140 mmol/L (ref 135–146)

## 2016-04-15 LAB — HEMOGLOBIN A1C
Hgb A1c MFr Bld: 6.9 % — ABNORMAL HIGH
Mean Plasma Glucose: 151 mg/dL

## 2016-04-15 LAB — TSH: TSH: 1.92 m[IU]/L

## 2016-04-15 NOTE — Progress Notes (Signed)
Subjective:  Dana Mcmahon is a 61 y.o. year old very pleasant female patient who presents for/with See problem oriented charting ROS- No chest pain or shortness of breath. No headache or blurry vision. Occasional heart racing   Past Medical History-  Patient Active Problem List   Diagnosis Date Noted  . Diabetes mellitus type II, uncontrolled ([facility]) 05/10/2013    Priority: High  . Hyperlipidemia     Priority: Medium  . HTN (hypertension), benign 05/10/2013    Priority: Medium  . Palpitations 05/10/2013    Priority: Medium  . Osteoarthritis, hand 12/16/2014    Priority: Low  . Allergic rhinitis 07/24/2014    Priority: Low  . Obesity, Class I, BMI 30-34.9     Priority: Low  . Dizzy spells 08/08/2013    Priority: Low  . Upper airway cough syndrome 01/22/2016    Medications- reviewed and updated Current Outpatient Prescriptions  Medication Sig Dispense Refill  . aspirin EC 81 MG tablet Take 1 tablet (81 mg total) by mouth daily. 30 tablet 0  . Cetirizine HCl (ZYRTEC ALLERGY) 10 MG CAPS Take 1 capsule (10 mg total) by mouth at bedtime. 30 capsule 5  . famotidine (PEPCID) 20 MG tablet TAKE 1 TABLET BY MOUTH AT BEDTIME. 90 tablet 2  . fluticasone (FLONASE) 50 MCG/ACT nasal spray Place 2 sprays into both nostrils daily. 16 g 5  . hydrochlorothiazide (HYDRODIURIL) 25 MG tablet TAKE 1 TABLET(25 MG) BY MOUTH DAILY 90 tablet 3  . metFORMIN (GLUCOPHAGE) 500 MG tablet TAKE 1 TABLET BY MOUTH EVERY DAY WITH THE LARGEST MEAL 30 tablet 5  . Multiple Vitamin (MULTIVITAMIN WITH MINERALS) TABS tablet Take 1 tablet by mouth daily.    . pantoprazole (PROTONIX) 40 MG tablet Take 1 tablet (40 mg total) by mouth daily. Take 30-60 min before first meal of the day 30 tablet 2   No current facility-administered medications for this visit.     Objective: BP (!) 154/78 (BP Location: Left Arm, Patient Position: Sitting, Cuff Size: Large)   Pulse 77   Temp 98.4 F (36.9 C) (Oral)   Ht 5' 3.5" (1.613 m)    Wt 169 lb 9.6 oz (76.9 kg)   SpO2 96%   BMI 29.57 kg/m  Gen: NAD, resting comfortably CV: RRR no murmurs rubs or gallops Lungs: CTAB no crackles, wheeze, rhonchi Abdomen: overweight Ext: no edema Skin: warm, dry  Diabetic Foot Exam - Simple   Simple Foot Form Diabetic Foot exam was performed with the following findings:  Yes 04/15/2016 10:14 PM  Visual Inspection No deformities, no ulcerations, no other skin breakdown bilaterally:  Yes Sensation Testing Intact to touch and monofilament testing bilaterally:  Yes Pulse Check Posterior Tibialis and Dorsalis pulse intact bilaterally:  Yes Comments    Assessment/Plan:  Diabetes mellitus type II, uncontrolled ([facility]) S: hopefully remains controlled On metformin 500mg  only CBGs- does not check, but denies lows Lab Results  Component Value Date   HGBA1C 7.0 (H) 09/11/2015   HGBA1C 7.3 04/16/2015   HGBA1C 6.9 (H) 07/24/2014   A/P: update foot exam today. a1c updated today along with tsh which had been hair high on prior check. Also update cmp.   HTN (hypertension), benign S: controlled on hctz 25mg  in past. Did not take her medication last night as got in late and BP elevated today. Will take this evening. Used to be on combo pill lisinopril 20-hctz 12.5mg  which worked better but had to come off lisinopril due to cough A/P: advised  to be consistent with BP med. Also to follow up for CPE and can recheck hopefully with her having taken med night prior. Last visit here she was controlled on HCTZ 25 though high normal with diastolic high [id number].   Hyperlipidemia S: poorly controlled on no medicine. No myalgias.  Lab Results  Component Value Date   CHOL 248 (H) 09/11/2015   HDL 47.90 09/11/2015   LDLDIRECT 136.0 09/11/2015   TRIG 308.0 (H) 09/11/2015   CHOLHDL 5 09/11/2015   A/P: we discussed today reasoning for not being on statin- she states that she does not want to add more medicine. She does agree to repeat at CPE- to work on  [employer]. If triglycerides remains above 200 and LDL over 100 would recommend statin again at that time- she states may be more receptive    Palpitations Stress seems to be the primary trigger. She has palpitations in high stress environment which is mentally draining/discouraging for her. She needs to rest and ease mind and issues resolves. She requests to continue to have FMLA for these times when stress levels are higher. Does not want to be on SSRI. Luckily has not been having dizziness as had in past with these episodes. Also of note- no chest pain or shortness of breath  Advised CPE just past 1 year from last CPE  At CPE need to push for colonoscopy- patient may have mentioned having at an outside office though- will double check at CPE. She had not returned [facility] call 11/2015  Orders Placed This Encounter  Procedures  . Basic metabolic panel  . Hemoglobin A1c  . Hepatic function panel  . TSH   Return precautions advised.  Garret Reddish, MD

## 2016-04-15 NOTE — Assessment & Plan Note (Signed)
S: hopefully remains controlled On metformin 500mg  only CBGs- does not check, but denies lows Lab Results  Component Value Date   HGBA1C 7.0 (H) 09/11/2015   HGBA1C 7.3 04/16/2015   HGBA1C 6.9 (H) 07/24/2014   A/P: update foot exam today. a1c updated today along with tsh which had been hair high on prior check. Also update cmp.

## 2016-04-15 NOTE — Assessment & Plan Note (Addendum)
Stress seems to be the primary trigger. She has palpitations in high stress environment which is mentally draining/discouraging for her. She needs to rest and ease mind and issues resolves. She requests to continue to have FMLA for these times when stress levels are higher. Does not want to be on SSRI. Luckily has not been having dizziness as had in past with these episodes. Also of note- no chest pain or shortness of breath

## 2016-04-15 NOTE — Assessment & Plan Note (Addendum)
S: controlled on hctz 25mg  in past. Did not take her medication last night as got in late and BP elevated today. Will take this evening. Used to be on combo pill lisinopril 20-hctz 12.5mg  which worked better but had to come off lisinopril due to cough A/P: advised to be consistent with BP med. Also to follow up for CPE and can recheck hopefully with her having taken med night prior. Last visit here she was controlled on HCTZ 25 though high normal with diastolic high [id number].

## 2016-04-15 NOTE — Assessment & Plan Note (Signed)
S: poorly controlled on no medicine. No myalgias.  Lab Results  Component Value Date   CHOL 248 (H) 09/11/2015   HDL 47.90 09/11/2015   LDLDIRECT 136.0 09/11/2015   TRIG 308.0 (H) 09/11/2015   CHOLHDL 5 09/11/2015   A/P: we discussed today reasoning for not being on statin- she states that she does not want to add more medicine. She does agree to repeat at CPE- to work on [employer]. If triglycerides remains above 200 and LDL over 100 would recommend statin again at that time- she states may be more receptive

## 2016-04-15 NOTE — Patient Instructions (Signed)
Glad the cough is better!   Take your blood pressure medicine tonight- suspect will go back down  Please stop by lab before you go

## 2016-04-15 NOTE — Progress Notes (Signed)
Pre visit review using our clinic review tool, if applicable. No additional management support is needed unless otherwise documented below in the visit note. 

## 2016-05-12 ENCOUNTER — Encounter: Payer: Self-pay | Admitting: Gastroenterology

## 2016-05-19 ENCOUNTER — Telehealth: Payer: Self-pay | Admitting: *Deleted

## 2016-05-19 NOTE — Telephone Encounter (Signed)
Attempted to call patient, but she did not answer the phone, and I was unable to leave a message. Will send letter.

## 2016-05-23 NOTE — Telephone Encounter (Signed)
See previous documentation

## 2016-06-10 ENCOUNTER — Encounter: Payer: Self-pay | Admitting: Gastroenterology

## 2016-06-24 ENCOUNTER — Ambulatory Visit (AMBULATORY_SURGERY_CENTER): Payer: Self-pay | Admitting: *Deleted

## 2016-06-24 VITALS — Ht 63.5 in | Wt 169.6 lb

## 2016-06-24 DIAGNOSIS — Z1211 Encounter for screening for malignant neoplasm of colon: Secondary | ICD-10-CM

## 2016-06-24 MED ORDER — NA SULFATE-K SULFATE-MG SULF 17.5-3.13-1.6 GM/177ML PO SOLN: 1.0000 | Freq: Once | ORAL | 0 refills | Status: AC

## 2016-06-24 NOTE — Progress Notes (Signed)
Denies allergies to eggs or soy products. Denies complications with sedation or anesthesia. Denies O2 use. Denies use of diet or weight loss medications.  Emmi instructions given for colonoscopy.  

## 2016-06-27 ENCOUNTER — Encounter: Payer: Self-pay | Admitting: Gastroenterology

## 2016-07-08 ENCOUNTER — Ambulatory Visit (AMBULATORY_SURGERY_CENTER): Payer: Federal, State, Local not specified - PPO | Admitting: Gastroenterology

## 2016-07-08 ENCOUNTER — Encounter: Payer: Self-pay | Admitting: Gastroenterology

## 2016-07-08 VITALS — BP 153/77 | HR 62 | Temp 98.9°F | Resp 12 | Ht 63.5 in | Wt 169.0 lb

## 2016-07-08 DIAGNOSIS — Z1211 Encounter for screening for malignant neoplasm of colon: Secondary | ICD-10-CM | POA: Diagnosis present

## 2016-07-08 DIAGNOSIS — Z1212 Encounter for screening for malignant neoplasm of rectum: Secondary | ICD-10-CM | POA: Diagnosis not present

## 2016-07-08 DIAGNOSIS — D123 Benign neoplasm of transverse colon: Secondary | ICD-10-CM

## 2016-07-08 MED ORDER — SODIUM CHLORIDE 0.9 % IV SOLN: 500.0000 mL | INTRAVENOUS | Status: DC

## 2016-07-08 NOTE — Progress Notes (Signed)
Called to room to assist during endoscopic procedure.  Patient ID and intended procedure confirmed with present staff. Received instructions for my participation in the procedure from the performing physician.  

## 2016-07-08 NOTE — Progress Notes (Signed)
Pt's states no medical or surgical changes since previsit or office visit. 

## 2016-07-08 NOTE — Patient Instructions (Signed)
YOU HAD AN ENDOSCOPIC PROCEDURE TODAY AT [facility]:   Refer to the procedure report that was given to you for any specific questions about what was found during the examination.  If the procedure report does not answer your questions, please call your gastroenterologist to clarify.  If you requested that your care partner not be given the details of your procedure findings, then the procedure report has been included in a sealed envelope for you to review at your convenience later.  YOU SHOULD EXPECT: Some feelings of bloating in the abdomen. Passage of more gas than usual.  Walking can help get rid of the air that was put into your GI tract during the procedure and reduce the bloating. If you had a lower endoscopy (such as a colonoscopy or flexible sigmoidoscopy) you may notice spotting of blood in your stool or on the toilet paper. If you underwent a bowel prep for your procedure, you may not have a normal bowel movement for a few days.  Please Note:  You might notice some irritation and congestion in your nose or some drainage.  This is from the oxygen used during your procedure.  There is no need for concern and it should clear up in a day or so.  SYMPTOMS TO REPORT IMMEDIATELY:   Following lower endoscopy (colonoscopy or flexible sigmoidoscopy):  Excessive amounts of blood in the stool  Significant tenderness or worsening of abdominal pains  Swelling of the abdomen that is new, acute  Fever of 100F or higher   For urgent or emergent issues, a gastroenterologist can be reached at any hour by calling [phone]. Please read all handouts given to you by your recovery nurse. May start Benefiber 1 TBS three times a day with meals and Miralax 1 cap full daily as needed.   DIET:  We do recommend a small meal at first, but then you may proceed to your regular diet.  Drink plenty of fluids but you should avoid alcoholic beverages for 24 hours.  ACTIVITY:  You should plan to  take it easy for the rest of today and you should NOT DRIVE or use heavy machinery until tomorrow (because of the sedation medicines used during the test).    FOLLOW UP: Our staff will call the number listed on your records the next business day following your procedure to check on you and address any questions or concerns that you may have regarding the information given to you following your procedure. If we do not reach you, we will leave a message.  However, if you are feeling well and you are not experiencing any problems, there is no need to return our call.  We will assume that you have returned to your regular daily activities without incident.  If any biopsies were taken you will be contacted by phone or by letter within the next 1-3 weeks.  Please call us at [phone] if you have not heard about the biopsies in 3 weeks.    SIGNATURES/CONFIDENTIALITY: You and/or your care partner have signed paperwork which will be entered into your electronic medical record.  These signatures attest to the fact that that the information above on your After Visit Summary has been reviewed and is understood.  Full responsibility of the confidentiality of this discharge information lies with you and/or your care-partner.  Thank you for letting us take care of your healthcare needs today.

## 2016-07-08 NOTE — Op Note (Signed)
[facility] Patient Name: Dana Mcmahon Procedure Date: 07/08/2016 3:15 PM MRN: [id number] Endoscopist: Mauri Pole , MD Age: 61 Referring MD:  Date of Birth: 04-29-1955 Gender: Female Account #: [id number] Procedure:                Colonoscopy Indications:              Screening for colorectal malignant neoplasm, This                            is the patient's first colonoscopy Medicines:                Monitored Anesthesia Care Procedure:                Pre-Anesthesia Assessment:                           - Prior to the procedure, a History and Physical                            was performed, and patient medications and                            allergies were reviewed. The patient's tolerance of                            previous anesthesia was also reviewed. The risks                            and benefits of the procedure and the sedation                            options and risks were discussed with the patient.                            All questions were answered, and informed consent                            was obtained. Prior Anticoagulants: The patient has                            taken no previous anticoagulant or antiplatelet                            agents. ASA Grade Assessment: II - A patient with                            mild systemic disease. After reviewing the risks                            and benefits, the patient was deemed in                            satisfactory condition to undergo the procedure.  After obtaining informed consent, the colonoscope                            was passed under direct vision. Throughout the                            procedure, the patient's blood pressure, pulse, and                            oxygen saturations were monitored continuously. The                            Model PCF-[id number]) scope was introduced                            through the anus and  advanced to the the terminal                            ileum, with identification of the appendiceal                            orifice and IC valve. The colonoscopy was performed                            without difficulty. The patient tolerated the                            procedure well. The quality of the bowel                            preparation was excellent. The terminal ileum,                            ileocecal valve, appendiceal orifice, and rectum                            were photographed. Scope In: 3:25:04 PM Scope Out: 3:35:35 PM Scope Withdrawal Time: 0 hours 7 minutes 11 seconds  Total Procedure Duration: 0 hours 10 minutes 31 seconds  Findings:                 The perianal and digital rectal examinations were                            normal.                           A 9 mm polyp was found in the transverse colon. The                            polyp was sessile. The polyp was removed with a                            cold snare. Resection and retrieval were complete.  Coagulation for hemostasis using monopolar probe                            with snare tip was successful for post polypectomy                            small amount of oozing. Estimated blood loss was                            minimal.                           A few small-mouthed diverticula were found in the                            sigmoid colon, descending colon, transverse colon                            and ascending colon.                           Non-bleeding internal hemorrhoids were found during                            retroflexion. The hemorrhoids were small. Complications:            No immediate complications. Estimated Blood Loss:     Estimated blood loss was minimal. Impression:               - One 9 mm polyp in the transverse colon, removed                            with a cold snare. Resected and retrieved. Treated                             with a monopolar probe.                           - Diverticulosis in the sigmoid colon, in the                            descending colon, in the transverse colon and in                            the ascending colon.                           - Non-bleeding internal hemorrhoids. Recommendation:           - Patient has a contact number available for                            emergencies. The signs and symptoms of potential                            delayed complications were discussed with the  patient. Return to normal activities tomorrow.                            Written discharge instructions were provided to the                            patient.                           - Resume previous diet.                           - Continue present medications.                           - Await pathology results.                           - Repeat colonoscopy in 5 years for surveillance                            based on pathology results. Mauri Pole, MD 07/08/2016 3:40:29 PM This report has been signed electronically.

## 2016-07-08 NOTE — Progress Notes (Signed)
  [facility] Post-op Note  Patient: Dana Mcmahon  Procedure(s) Performed: colonoscopy  Patient Location: [facility] - Recovery Area  Anesthesia Type: Deep Sedation/Propofol  Level of Consciousness: awake, oriented and patient cooperative  Airway and Oxygen Therapy: Patient Spontanous Breathing  Post-op Pain: none  Post-op Assessment:  Post-op Vital signs reviewed, Patient's Cardiovascular Status Stable, Respiratory Function Stable, Patent Airway, No signs of Nausea or vomiting and Pain level controlled  Post-op Vital Signs: Reviewed and stable  Complications: No apparent anesthesia complications  Kaylan Yates E Elesa Garman 3:42 PM

## 2016-07-11 ENCOUNTER — Telehealth: Payer: Self-pay | Admitting: *Deleted

## 2016-07-11 NOTE — Telephone Encounter (Signed)
  Follow up Call-  Call back number 07/08/2016  Post procedure Call Back phone  # [phone]  Permission to leave phone message Yes  Some recent data might be hidden     Patient questions:  Do you have a fever, pain , or abdominal swelling? No. Pain Score  0 *  Have you tolerated food without any problems? Yes.    Have you been able to return to your normal activities? Yes.    Do you have any questions about your discharge instructions: Diet   No. Medications  No. Follow up visit  No.  Do you have questions or concerns about your Care? No.  Actions: * If pain score is 4 or above: No action needed, pain <4.

## 2016-07-21 ENCOUNTER — Encounter: Payer: Self-pay | Admitting: Gastroenterology

## 2016-07-25 ENCOUNTER — Encounter: Payer: Self-pay | Admitting: Family Medicine

## 2016-07-25 DIAGNOSIS — Z86010 Personal history of colonic polyps: Secondary | ICD-10-CM | POA: Insufficient documentation

## 2016-09-05 ENCOUNTER — Telehealth: Payer: Self-pay | Admitting: Family Medicine

## 2016-09-05 NOTE — Telephone Encounter (Signed)
The patient wants Roselyn Reef to call her.

## 2016-09-06 ENCOUNTER — Encounter: Payer: Self-pay | Admitting: Family Medicine

## 2016-09-06 ENCOUNTER — Ambulatory Visit (INDEPENDENT_AMBULATORY_CARE_PROVIDER_SITE_OTHER): Payer: Federal, State, Local not specified - PPO | Admitting: Family Medicine

## 2016-09-06 VITALS — BP 118/60 | HR 81 | Temp 98.2°F | Ht 63.5 in | Wt 166.8 lb

## 2016-09-06 DIAGNOSIS — R079 Chest pain, unspecified: Secondary | ICD-10-CM

## 2016-09-06 DIAGNOSIS — IMO0001 Reserved for inherently not codable concepts without codable children: Secondary | ICD-10-CM

## 2016-09-06 DIAGNOSIS — E1165 Type 2 diabetes mellitus with hyperglycemia: Secondary | ICD-10-CM | POA: Diagnosis not present

## 2016-09-06 DIAGNOSIS — I10 Essential (primary) hypertension: Secondary | ICD-10-CM | POA: Diagnosis not present

## 2016-09-06 DIAGNOSIS — E785 Hyperlipidemia, unspecified: Secondary | ICD-10-CM | POA: Diagnosis not present

## 2016-09-06 NOTE — Assessment & Plan Note (Signed)
Has refused statins. Will update LDL today Lab Results  Component Value Date   CHOL 248 (H) 09/11/2015   HDL 47.90 09/11/2015   LDLDIRECT 136.0 09/11/2015   TRIG 308.0 (H) 09/11/2015   CHOLHDL 5 09/11/2015  prior labs suggest strong need for statin

## 2016-09-06 NOTE — Assessment & Plan Note (Signed)
S: controlled on hctz 25mg  daily.  BP Readings from Last 3 Encounters:  09/06/16 118/60  07/08/16 (!) 153/77  04/15/16 (!) 154/78  A/P: blood pressure goal of <140/90. Continue current meds:  At goal today

## 2016-09-06 NOTE — Assessment & Plan Note (Signed)
S: hopefully controlled. On metformin 500mg . Has not been compliant with advised follow up for preventative care Lab Results  Component Value Date   HGBA1C 6.9 (H) 04/15/2016   HGBA1C 7.0 (H) 09/11/2015   HGBA1C 7.3 04/16/2015   A/P: took opportunity with acute visit to update labs including a1c

## 2016-09-06 NOTE — Telephone Encounter (Signed)
Patient is scheduled with Dr. Yong Channel this afternoon

## 2016-09-06 NOTE — Patient Instructions (Signed)
Please stop by lab before you go  New or worsening symptoms- seek care immediately in emergency room  We will call you within a week or two about your referral to cardiology. If you do not hear within 3 weeks, give [place] a call.

## 2016-09-06 NOTE — Progress Notes (Signed)
Subjective:  Dana Mcmahon is a 61 y.o. year old very pleasant female patient who presents for/with See problem oriented charting ROS- has had chest pain as well as shortness of breath. No fever or chills. No cough or congestion.    Past Medical History-  Patient Active Problem List   Diagnosis Date Noted  . Diabetes mellitus type II, uncontrolled ([place]) 05/10/2013    Priority: High  . History of adenomatous polyp of colon 07/25/2016    Priority: Medium  . Hyperlipidemia     Priority: Medium  . HTN (hypertension), benign 05/10/2013    Priority: Medium  . Palpitations 05/10/2013    Priority: Medium  . Osteoarthritis, hand 12/16/2014    Priority: Low  . Allergic rhinitis 07/24/2014    Priority: Low  . Obesity, Class I, BMI 30-34.9     Priority: Low  . Dizzy spells 08/08/2013    Priority: Low  . Upper airway cough syndrome 01/22/2016    Medications- reviewed and updated Current Outpatient Prescriptions  Medication Sig Dispense Refill  . aspirin EC 81 MG tablet Take 81 mg by mouth daily.    . fexofenadine (ALLEGRA) 30 MG tablet Take 30 mg by mouth 2 (two) times daily.    . fluticasone (FLONASE) 50 MCG/ACT nasal spray Place 2 sprays into both nostrils daily. 16 g 5  . hydrochlorothiazide (HYDRODIURIL) 25 MG tablet TAKE 1 TABLET(25 MG) BY MOUTH DAILY 90 tablet 3  . metFORMIN (GLUCOPHAGE) 500 MG tablet TAKE 1 TABLET BY MOUTH EVERY DAY WITH THE LARGEST MEAL 30 tablet 5  . Multiple Vitamin (MULTIVITAMIN WITH MINERALS) TABS tablet Take 1 tablet by mouth daily.     No current facility-administered medications for this visit.     Objective: BP 118/60 (BP Location: Left Arm, Patient Position: Sitting, Cuff Size: Large)   Pulse 81   Temp 98.2 F (36.8 C) (Oral)   Ht 5' 3.5" (1.613 m)   Wt 166 lb 12.8 oz (75.7 kg)   SpO2 92%   BMI 29.08 kg/m  Gen: NAD, resting comfortably, well appearing CV: RRR faint murmur (normal echo 05/2015) No chest wall pain Lungs: CTAB no crackles,  wheeze, rhonchi Abdomen: soft/nontender/nondistended/normal bowel sounds. No rebound or guarding.  Ext: no edema Skin: warm, dry   EKG: rate 77 with sinus rhythm, left axis, normal intervals, no hypertrophy, no st or t wave changes. Incomplete right bundle. Largely unchanged from 01/28/13.   Assessment/Plan:  Chest pain, unspecified type - Plan: EKG 12-Lead, Ambulatory referral to Cardiology, CBC, Comprehensive metabolic panel, LDL cholesterol, direct S: in march had an episode walking into grocery store where she couldn't breath well and had chest pain lasting about 4 minutes. Chest pain  Was central to left sided sharpness. Another episode 3 weeks ago at church, had pain start in epigastric area and go up into chest and had difficulty breathing. Was sitting there when happened. Got some fresh air - got better within 4-5 minutes. This past Sunday felt it again- at church again. Very similar starting at rest. Seemed to move up from her lower abdomen then up into chest the last 2 times. Had cold sweats witht this. Vital signs reportedly were normal from a nurse at the church. Occasional RUQ pain in abdomen after colon- has had either firm stools or watery and scheduled follow up for Dr. Silverio Decamp.  A/P: Atypical chest pain. I suspect this is anxiety related as other physical symptoms have been in the past. No clear exertional element to tie  the 3 together- in fact 2 were at rest (other walking into grocery store). About 2 years ago we had planned for stress test for palpitations but improved with control of anxiety and not pursued. She would like to bypass that and prefers to talk to cardiology- I placed referral at her request. She does have risk factors including age, hypertension, hyperlipidemia for cardiac disease. Thankful she is following up with GI for change in bowel habits.   Faint murmur but reassuring echo within 18 months.   Well appearing today- doubt sats of 92% but did not repeat as  intended before departure from visit. Doubt strongly PE would cause such intermittent symptoms over month.   Diabetes mellitus type II, uncontrolled ([place]) S: hopefully controlled. On metformin 500mg . Has not been compliant with advised follow up for preventative care Lab Results  Component Value Date   HGBA1C 6.9 (H) 04/15/2016   HGBA1C 7.0 (H) 09/11/2015   HGBA1C 7.3 04/16/2015   A/P: took opportunity with acute visit to update labs including a1c   HTN (hypertension), benign S: controlled on hctz 25mg  daily.  BP Readings from Last 3 Encounters:  09/06/16 118/60  07/08/16 (!) 153/77  04/15/16 (!) 154/78  A/P: blood pressure goal of <140/90. Continue current meds:  At goal today   Hyperlipidemia Has refused statins. Will update LDL today Lab Results  Component Value Date   CHOL 248 (H) 09/11/2015   HDL 47.90 09/11/2015   LDLDIRECT 136.0 09/11/2015   TRIG 308.0 (H) 09/11/2015   CHOLHDL 5 09/11/2015  prior labs suggest strong need for statin   Patient Instructions  Please stop by lab before you go  New or worsening symptoms- seek care immediately in emergency room  We will call you within a week or two about your referral to cardiology. If you do not hear within 3 weeks, give [place] a call.    Orders Placed This Encounter  Procedures  . CBC    [place]  . Comprehensive metabolic panel    [place]  . LDL cholesterol, direct    [place]  . Hemoglobin A1c    [place]  . Ambulatory referral to Cardiology    Referral Priority:   Routine    Referral Type:   Consultation    Referral Reason:   Specialty Services Required    Requested Specialty:   Cardiology    Number of Visits Requested:   1  . EKG 12-Lead    Order Specific Question:   Where should this test be performed    Answer:   Other    Meds ordered this encounter  Medications  . aspirin EC 81 MG tablet    Sig: Take 81 mg by mouth daily.    Return precautions advised.  Garret Reddish, MD

## 2016-09-06 NOTE — Telephone Encounter (Signed)
Called patient and was unable to leave a voicemail message. I will try again

## 2016-09-07 LAB — COMPREHENSIVE METABOLIC PANEL
ALBUMIN: 4.2 g/dL (ref 3.5–5.2)
ALT: 25 U/L (ref 0–35)
AST: 18 U/L (ref 0–37)
Alkaline Phosphatase: 64 U/L (ref 39–117)
BUN: 19 mg/dL (ref 6–23)
CALCIUM: 9.7 mg/dL (ref 8.4–10.5)
CHLORIDE: 100 meq/L (ref 96–112)
CO2: 30 mEq/L (ref 19–32)
CREATININE: 0.82 mg/dL (ref 0.40–1.20)
GFR: 75.16 mL/min (ref >60.00)
Glucose, Bld: 153 mg/dL — ABNORMAL HIGH (ref 70–99)
POTASSIUM: 3.3 meq/L — AB (ref 3.5–5.1)
Sodium: 139 mEq/L (ref 135–145)
Total Bilirubin: 0.4 mg/dL (ref 0.2–1.2)
Total Protein: 7 g/dL (ref 6.0–8.3)

## 2016-09-07 LAB — CBC
HEMATOCRIT: 40.5 % (ref 36.0–46.0)
Hemoglobin: 13.6 g/dL (ref 12.0–15.0)
MCHC: 33.6 g/dL (ref 30.0–36.0)
MCV: 86.8 fl (ref 78.0–100.0)
PLATELETS: 288 10*3/uL (ref 150.0–400.0)
RBC: 4.66 Mil/uL (ref 3.87–5.11)
RDW: 14.2 % (ref 11.5–15.5)
WBC: 5.3 10*3/uL (ref 4.0–10.5)

## 2016-09-07 LAB — HEMOGLOBIN A1C: Hgb A1c MFr Bld: 7.2 % — ABNORMAL HIGH (ref 4.6–6.5)

## 2016-09-07 LAB — LDL CHOLESTEROL, DIRECT: Direct LDL: 112 mg/dL

## 2016-09-08 ENCOUNTER — Other Ambulatory Visit: Payer: Self-pay

## 2016-09-08 MED ORDER — METFORMIN HCL 500 MG PO TABS: 500.0000 mg | ORAL_TABLET | Freq: Two times a day (BID) | ORAL | 1 refills | Status: DC

## 2016-09-22 DIAGNOSIS — M67441 Ganglion, right hand: Secondary | ICD-10-CM | POA: Diagnosis not present

## 2016-09-27 ENCOUNTER — Telehealth: Payer: Self-pay

## 2016-09-27 NOTE — Telephone Encounter (Signed)
perfect thanks for following up

## 2016-09-27 NOTE — Telephone Encounter (Signed)
Called and spoke to patient and provided her with the following contact information:  [place], [place] Address: [address], [place], [place] [address] Phone: [phone]  She will call their office to get scheduled.

## 2016-11-15 ENCOUNTER — Ambulatory Visit: Payer: Self-pay | Admitting: Gastroenterology

## 2016-11-18 ENCOUNTER — Other Ambulatory Visit: Payer: Self-pay | Admitting: Family Medicine

## 2016-11-29 ENCOUNTER — Other Ambulatory Visit: Payer: Self-pay | Admitting: Family Medicine

## 2016-11-30 ENCOUNTER — Telehealth: Payer: Self-pay | Admitting: Family Medicine

## 2016-11-30 NOTE — Telephone Encounter (Signed)
MEDICATION:   hydrochlorothiazide (HYDRODIURIL) 25 MG tablet   PHARMACY:   [facility], [place] - [address] AT [address] [phone] (Phone) [phone] (Fax)   IS THIS A 90 DAY SUPPLY : yes  IS PATIENT OUT OF MEDICATION: yes  IF NOT; HOW MUCH IS LEFT: n/a  LAST APPOINTMENT DATE: @7 /17/18  NEXT APPOINTMENT DATE:@10 /19/2018  OTHER COMMENTS: Patient was made aware that Dr. Yong Channel and staff were gone for the day. Patient states she has been out of the medication for over 1 week. Please advise.    **Let patient know to contact pharmacy at the end of the day to make sure medication is ready. **  ** Please notify patient to allow 48-72 hours to process**  **Encourage patient to contact the pharmacy for refills or they can request refills through [facility]**

## 2016-12-01 NOTE — Telephone Encounter (Signed)
Prescription was sent to pharmacy on 11/29/2016

## 2016-12-09 ENCOUNTER — Ambulatory Visit: Payer: Federal, State, Local not specified - PPO | Admitting: Family Medicine

## 2016-12-13 ENCOUNTER — Other Ambulatory Visit: Payer: Self-pay

## 2016-12-13 ENCOUNTER — Telehealth: Payer: Self-pay | Admitting: Family Medicine

## 2016-12-13 MED ORDER — HYDROCHLOROTHIAZIDE 25 MG PO TABS: 25.0000 mg | ORAL_TABLET | Freq: Every day | ORAL | 1 refills | Status: DC

## 2016-12-13 NOTE — Telephone Encounter (Signed)
MEDICATION: hydrochlorothiazide (HYDRODIURIL) 25 MG tablet  PHARMACY:   [facility] [place], [place] AT [address] [phone] (Phone) [phone] (Fax)     IS THIS A 90 DAY SUPPLY : yes  IS PATIENT OUT OF MEDICATION: yes  IF NOT; HOW MUCH IS LEFT:   LAST APPOINTMENT DATE: @7 /17/18  NEXT APPOINTMENT DATE:@11 /01/2017  OTHER COMMENTS: Patient states that the pharmacy did not receive the medication request. I advised the patient that there was a receipt sent by the pharmacy however, I would ask for the medication to be resent.    **Let patient know to contact pharmacy at the end of the day to make sure medication is ready. **  ** Please notify patient to allow 48-72 hours to process**  **Encourage patient to contact the pharmacy for refills or they can request refills through [facility]**

## 2016-12-13 NOTE — Telephone Encounter (Signed)
Sent to pharmacy as requested 

## 2016-12-21 LAB — HM DIABETES EYE EXAM

## 2016-12-22 ENCOUNTER — Encounter: Payer: Self-pay | Admitting: Family Medicine

## 2016-12-28 ENCOUNTER — Telehealth: Payer: Self-pay | Admitting: Family Medicine

## 2016-12-28 ENCOUNTER — Other Ambulatory Visit: Payer: Self-pay | Admitting: Family Medicine

## 2016-12-28 DIAGNOSIS — Z1231 Encounter for screening mammogram for malignant neoplasm of breast: Secondary | ICD-10-CM

## 2016-12-28 NOTE — Telephone Encounter (Signed)
Patient says Modena Nunnery is supposed to call back RE changing appt time on Monday 11/12 she has a 2:30pm and wants it to be changed to a later time in the day.  Ty,  -LL

## 2017-01-02 ENCOUNTER — Encounter: Payer: Self-pay | Admitting: Family Medicine

## 2017-01-02 ENCOUNTER — Ambulatory Visit (INDEPENDENT_AMBULATORY_CARE_PROVIDER_SITE_OTHER): Payer: Federal, State, Local not specified - PPO | Admitting: Family Medicine

## 2017-01-02 ENCOUNTER — Ambulatory Visit: Payer: Federal, State, Local not specified - PPO | Admitting: Family Medicine

## 2017-01-02 VITALS — BP 134/80 | HR 73 | Temp 98.3°F | Ht 63.5 in | Wt 167.0 lb

## 2017-01-02 DIAGNOSIS — M67911 Unspecified disorder of synovium and tendon, right shoulder: Secondary | ICD-10-CM | POA: Diagnosis not present

## 2017-01-02 DIAGNOSIS — E1165 Type 2 diabetes mellitus with hyperglycemia: Secondary | ICD-10-CM | POA: Diagnosis not present

## 2017-01-02 DIAGNOSIS — E785 Hyperlipidemia, unspecified: Secondary | ICD-10-CM | POA: Diagnosis not present

## 2017-01-02 LAB — POCT GLYCOSYLATED HEMOGLOBIN (HGB A1C): Hemoglobin A1C: 6.9

## 2017-01-02 MED ORDER — MELOXICAM 7.5 MG PO TABS: 7.5000 mg | ORAL_TABLET | Freq: Every day | ORAL | 0 refills | Status: DC

## 2017-01-02 NOTE — Patient Instructions (Addendum)
Make sure to stay well hydrated, avoid caffeine to help with palpitaitons  Lab Results  Component Value Date   HGBA1C 6.9 01/02/2017  Great job getting this down!   Also meant to mention cholesterol still above goal- any change of heart on cholesterol medicine? Weight loss can help lower risks  Start meloxicam 7.5mg  daily for 10-14 days. Schedule visit with physical therapy at front desk. If not improving- will need to see orthopedics- particularly before speciality work equipment ordered

## 2017-01-02 NOTE — Progress Notes (Signed)
Subjective:  Dana Mcmahon is a 61 y.o. year old very pleasant female patient who presents for/with See problem oriented charting ROS- right shoulder pain. Intermittent palpitations. No current chest pain. No edema.    Past Medical History-  Patient Active Problem List   Diagnosis Date Noted  . Diabetes mellitus type II, uncontrolled ([place]) 05/10/2013    Priority: High  . History of adenomatous polyp of colon 07/25/2016    Priority: Medium  . Hyperlipidemia     Priority: Medium  . HTN (hypertension), benign 05/10/2013    Priority: Medium  . Palpitations 05/10/2013    Priority: Medium  . Osteoarthritis, hand 12/16/2014    Priority: Low  . Allergic rhinitis 07/24/2014    Priority: Low  . Obesity, Class I, BMI 30-34.9     Priority: Low  . Dizzy spells 08/08/2013    Priority: Low  . Upper airway cough syndrome 01/22/2016    Medications- reviewed and updated Current Outpatient Medications  Medication Sig Dispense Refill  . aspirin EC 81 MG tablet Take 81 mg by mouth daily.    . fexofenadine (ALLEGRA) 30 MG tablet Take 30 mg by mouth 2 (two) times daily.    . fluticasone (FLONASE) 50 MCG/ACT nasal spray Place 2 sprays into both nostrils daily. 16 g 5  . hydrochlorothiazide (HYDRODIURIL) 25 MG tablet Take 1 tablet (25 mg total) by mouth daily. 90 tablet 1  . metFORMIN (GLUCOPHAGE) 500 MG tablet Take 1 tablet (500 mg total) by mouth 2 (two) times daily with a meal. 180 tablet 1  . Multiple Vitamin (MULTIVITAMIN WITH MINERALS) TABS tablet Take 1 tablet by mouth daily.     No current facility-administered medications for this visit.     Objective: BP 134/80 (BP Location: Left Arm, Patient Position: Sitting, Cuff Size: Large)   Pulse 73   Temp 98.3 F (36.8 C) (Oral)   Ht 5' 3.5" (1.613 m)   Wt 167 lb (75.8 kg)   SpO2 96%   BMI 29.12 kg/m  Gen: NAD, resting comfortably CV: RRR no murmurs rubs or gallops Lungs: CTAB no crackles, wheeze, rhonchi Abdomen:  soft/nontender/nondistended/obese Ext: no edema Skin: warm, dry  Right Shoulder: Inspection reveals no abnormalities, atrophy or asymmetry. Palpation is normal with no tenderness over AC joint or bicipital groove.some pain in posterior shoulder- some tenderness into right trapezius ROM is full in all planes but slowed by pain.  Positive signs of impingement with negative Neer and Hawkin's tests, empty can. Speeds and Yergason's tests normal. No labral pathology noted with negative Obrien's, negative clunk and good stability. Normal scapular function observed. No painful arc and no drop arm sign. No apprehension sign    Assessment/Plan:  For palpitations- does better when stays hydrated and avoids caffeine- encouraged again. Had prior stress testing in 2017 which was reassuring.   Tendinopathy of right rotator cuff - Plan: Ambulatory referral to Physical Therapy S: right shoulder pain for over a year- hasnt reported previously. Pain level reportedly up to 9/10. Cant reach behind her due to shoulder pain. Strongly declines injection/ortho referral.  A/P: Patient's main concern seems to be getting an ergonomic keyboard- I told her with rotator cuff tendinopathy that likely would not be curative. With level of reported pain encouraged sports medicine or ortho referral. She declines but does agree to trial of mobic 10-14 days and PT refer- placed today  Diabetes mellitus type II, uncontrolled ([place]) S: poorly controlled. On last check with a1c goal 7 or less- we had  plannedincreased metformin to 500mg  BID from daily. She did not increase this- walking during day then every night an additional 15 minutes. Weight is stable Wt Readings from Last 3 Encounters:  01/02/17 167 lb (75.8 kg)  09/06/16 166 lb 12.8 oz (75.7 kg)  07/08/16 169 lb (76.7 kg)   Lab Results  Component Value Date   HGBA1C 7.2 (H) 09/06/2016   HGBA1C 6.9 (H) 04/15/2016   HGBA1C 7.0 (H) 09/11/2015   A/P: continue current  medicine- metformin 500mg  once a day and follow up in 4 months  Hyperlipidemia S: poorly controlled on no statin. No myalgias.  Lab Results  Component Value Date   CHOL 248 (H) 09/11/2015   HDL 47.90 09/11/2015   LDLDIRECT 112.0 09/06/2016   TRIG 308.0 (H) 09/11/2015   CHOLHDL 5 09/11/2015   A/P: even with DM and elevated risk- declines statin unfortunately  Future Appointments  Date Time Provider [place]  01/23/2017  3:00 PM Mauri Pole, MD [facility]  01/27/2017  4:40 PM [facility] MM 3 [facility]  06/02/2017  3:30 PM Marin Olp, MD [facility] None   Return in about 4 months (around 05/02/2017) for follow up- or sooner if needed.  Orders Placed This Encounter  Procedures  . Ambulatory referral to Physical Therapy    Referral Priority:   Routine    Referral Type:   Physical Medicine    Referral Reason:   Specialty Services Required    Requested Specialty:   Physical Therapy    Number of Visits Requested:   1  . POCT glycosylated hemoglobin (Hb A1C)    Meds ordered this encounter  Medications  . meloxicam (MOBIC) 7.5 MG tablet    Sig: Take 1 tablet (7.5 mg total) daily by mouth.    Dispense:  14 tablet    Refill:  0   Return precautions advised.  Garret Reddish, MD

## 2017-01-02 NOTE — Assessment & Plan Note (Signed)
S: poorly controlled on no statin. No myalgias.  Lab Results  Component Value Date   CHOL 248 (H) 09/11/2015   HDL 47.90 09/11/2015   LDLDIRECT 112.0 09/06/2016   TRIG 308.0 (H) 09/11/2015   CHOLHDL 5 09/11/2015   A/P: even with DM and elevated risk- declines statin unfortunately

## 2017-01-02 NOTE — Assessment & Plan Note (Signed)
S: poorly controlled. On last check with a1c goal 7 or less- we had plannedincreased metformin to 500mg  BID from daily. She did not increase this- walking during day then every night an additional 15 minutes. Weight is stable Wt Readings from Last 3 Encounters:  01/02/17 167 lb (75.8 kg)  09/06/16 166 lb 12.8 oz (75.7 kg)  07/08/16 169 lb (76.7 kg)   Lab Results  Component Value Date   HGBA1C 7.2 (H) 09/06/2016   HGBA1C 6.9 (H) 04/15/2016   HGBA1C 7.0 (H) 09/11/2015   A/P: continue current medicine- metformin 500mg  once a day and follow up in 4 months

## 2017-01-04 NOTE — Progress Notes (Signed)
Patient has been scheduled for physical therapy. No further action required.

## 2017-01-16 ENCOUNTER — Ambulatory Visit (INDEPENDENT_AMBULATORY_CARE_PROVIDER_SITE_OTHER): Payer: Federal, State, Local not specified - PPO | Admitting: Physical Therapy

## 2017-01-16 ENCOUNTER — Encounter: Payer: Self-pay | Admitting: Physical Therapy

## 2017-01-16 ENCOUNTER — Other Ambulatory Visit: Payer: Self-pay

## 2017-01-16 DIAGNOSIS — M25511 Pain in right shoulder: Secondary | ICD-10-CM | POA: Diagnosis not present

## 2017-01-16 DIAGNOSIS — G8929 Other chronic pain: Secondary | ICD-10-CM | POA: Diagnosis not present

## 2017-01-16 DIAGNOSIS — M6281 Muscle weakness (generalized): Secondary | ICD-10-CM

## 2017-01-16 DIAGNOSIS — R293 Abnormal posture: Secondary | ICD-10-CM | POA: Diagnosis not present

## 2017-01-16 DIAGNOSIS — M25611 Stiffness of right shoulder, not elsewhere classified: Secondary | ICD-10-CM | POA: Diagnosis not present

## 2017-01-16 NOTE — Patient Instructions (Signed)
  TARGET BALL:  Turn towards the main section of the store and head towards the cards.  Turn at the cards and head towards the back of the store.  Just past the home goods before you get to the toys you will see an end cap with "party favor" type toys.  The ball is located in one of these bins.  Look for the softball sized ball that lights up.  It should be around $5.   Healthy Back - Shoulder Roll    Stand straight with arms relaxed at sides. Roll shoulders backward continuously. Do __10-15_ times. Do several sessions per day.   Scapular Retraction (Standing)    With arms at sides, pinch shoulder blades together.  Hold for 2-3 seconds. Repeat __10-15__ times per set. Do __1__ sets per session. Do __2-3__ sessions per day.               Scapula Adduction With Pectoralis Stretch: Low - Standing   Shoulders at 45 hands even with shoulders, keeping weight through legs, shift weight forward until you feel pull or stretch through the front of your chest. Hold _30__ seconds. Do _3__ times, _2-4__ times per day.

## 2017-01-16 NOTE — Therapy (Addendum)
[facility] [address] [place], [place], [address] Phone: [phone]   Fax:  [phone]  Physical Therapy Evaluation  Patient Details  Name: Dana Mcmahon MRN: [id number] Date of Birth: [birth year] Referring Provider: Dr. Garret Reddish   Encounter Date: 01/16/2017  PT End of Session - 01/16/17 1608    Visit Number  1    Number of Visits  6    Date for PT Re-Evaluation  02/27/17    Authorization Type  [employer] $30 copay    PT Start Time  1517    PT Stop Time  1557    PT Time Calculation (min)  40 min    Activity Tolerance  Patient tolerated treatment well    Behavior During Therapy  [facility] for tasks assessed/performed       Past Medical History:  Diagnosis Date  . Allergic rhinitis   . Anxiety   . Arthritis   . DM type 2 (diabetes mellitus, type 2) ([place])   . Heart murmur   . Hyperlipidemia    per old records, pt has refused cholesto-lowering meds  . Hypertension   . Neck pain, chronic    Mild DDD, no signif progression (multiple MRI's: 2001, 2003, 2005, 2007)  . Obesity, Class I, BMI 30-34.9   . 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 none      There were no vitals filed for this visit.   Subjective Assessment - 01/16/17 1517    Subjective  Pt is a 61 y/o female who presents to OPPT for Rt shoulder pain x 18 months.  Pt reports that she feels her workstation isn't set up properly which she feels is contributing to her pain.  States maintainence is lowering desk to help with pain.    Limitations  Sitting;House hold activities    Patient Stated Goals  improve pain, perform UB dressing without pain    Currently in Pain?  Yes    Pain Score  6     Pain Location  Shoulder    Pain Orientation  Right    Pain Descriptors / Indicators  Sore;Aching    Pain Type  Chronic pain    Pain Onset  More than a month ago    Pain Frequency  Intermittent    Aggravating Factors    sleeping on Rt side; reaching behind back and with extension and external rotation, cervical rotation to right    Pain Relieving Factors  hot water, exercises (shoulder rolls)         [facility] PT Assessment - 01/16/17 1522      Assessment   Medical Diagnosis  Rt shoulder pain    Referring Provider  Dr. Garret Reddish    Onset Date/Surgical Date  -- 18 months    Hand Dominance  Right    Next MD Visit  06/02/17    Prior Therapy  n/a      Precautions   Precautions  None      Restrictions   Weight Bearing Restrictions  No      Balance Screen   Has the patient fallen in the past 6 months  Yes    How many times?  1-fell down her steps (May 2018)    Has the patient had a decrease in activity level because of a fear of falling?   No    Is the patient reluctant to leave their home because  of a fear of falling?   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    Leisure  watch TV; no regular exercise      Cognition   Overall Cognitive Status  Within Functional Limits for tasks assessed      Posture/Postural Control   Posture/Postural Control  Postural limitations    Postural Limitations  Rounded Shoulders;Forward head      ROM / Strength   AROM / PROM / Strength  AROM;Strength      AROM   Overall AROM Comments  Rt shoulder flexion, external rotation and abduction WNL with end range tightness and pain; functional internal rotation to T11/12      Strength   Strength Assessment Site  Shoulder    Right/Left Shoulder  Right;Left    Right Shoulder Flexion  3+/5    Right Shoulder Extension  5/5    Right Shoulder ABduction  3/5    Right Shoulder Internal Rotation  4/5     Right Shoulder External Rotation  3/5    Left Shoulder Flexion  4/5    Left Shoulder Extension  5/5    Left Shoulder ABduction  4/5    Left Shoulder Internal Rotation  5/5    Left Shoulder External Rotation  4/5      Palpation   Palpation comment  pt with trigger points noted in Rt upper trap and rhomboids; overly sensitive to palpation and frequently withdrawing to light touch      Special Tests    Special Tests  Rotator Cuff Impingement    Rotator Cuff Impingment tests  Empty Can test;Full Can test;Hawkins- Kennedy test      Hawkins-Kennedy test   Comments  decreased pain with increased horizontal adduction      Empty Can test   Findings  Positive    Side  Right      Full Can test   Findings  Positive    Side  Right             Objective measurements completed on examination: See above findings.      [facility] PT Treatment/Exercise - 01/16/17 1522      Self-Care   Self-Care  Other Self-Care Comments    Other Self-Care Comments   use of ball for myofascial release      Exercises   Exercises  Shoulder      Shoulder Exercises: Seated   Retraction  Both;5 reps    Retraction Limitations  mod cues for proper technique    Other Seated Exercises  backwards shoulder rolls x 10      Shoulder Exercises: Stretch   Other Shoulder Stretches  low doorway stretch 2 x 30 sec; mod cues for technique             PT Education - 01/16/17 1607    Education provided  Yes    Education Details  HEP, briefly discussed DN - pt declined at this time    Person(s) Educated  Patient    Methods  Explanation;Handout;Demonstration    Comprehension  Verbal cues required;Need  further instruction;Returned demonstration;Verbalized understanding          PT Long Term Goals - 01/16/17 1613      PT LONG TERM GOAL #1   Title  independent with HEP    Status  New    Target Date  02/27/17      PT LONG TERM GOAL #2   Title  improve functional internal rotation to at least T7  for improved UB dressing    Status  New    Target Date  02/27/17      PT LONG TERM GOAL #3   Title  report pain < 4/10 at work for improved function    Status  New    Target Date  02/27/17      PT LONG TERM GOAL #4   Title  demonstrate at least 4/5 RUE strength for improved function    Status  New    Target Date  02/27/17             Plan - 01/16/17 1609    Clinical Impression Statement  Pt is a 61 y/o female who presents to OPPT for Rt shoulder pain consistent with RTC symdrome.  Pt very tender to even light palpation today and reports Lt shoulder pain as well, but only during examination (no mention during subjective and history).  Pt demonstrates mild ROM deficits, decreased strength and pain as well as postural abnormalities affecting ADLs and work responsibilities.  Pt will benefit from PT to address these deficits.    History and Personal Factors relevant to plan of care:  anxiety    Clinical Presentation  Evolving    Clinical Presentation due to:  increased pain    Clinical Decision Making  Low    Rehab Potential  Fair    PT Frequency  1x / week recommending 2x/wk but pt only able to come 1x/wk due to copay    PT Duration  6 weeks    PT Treatment/Interventions  ADLs/Self Care Home Management;Cryotherapy;Electrical Stimulation;Ultrasound;Moist Heat;Therapeutic activities;Therapeutic exercise;Patient/family education;Neuromuscular re-education;Vasopneumatic Device;Taping;Dry needling;Passive range of motion    PT Next Visit Plan  review HEP, give internal rotation stretch, manual for trigger points, modalities PRN    Consulted and Agree with Plan of Care  Patient       Patient will benefit from skilled therapeutic intervention in order to improve the following deficits and impairments:  Increased fascial restricitons, Increased muscle spasms, Pain, Postural dysfunction, Decreased range of motion, Decreased strength, Impaired UE functional use  Visit Diagnosis: Chronic  right shoulder pain - Plan: PT plan of care cert/re-cert  Stiffness of right shoulder, not elsewhere classified - Plan: PT plan of care cert/re-cert  Abnormal posture - Plan: PT plan of care cert/re-cert  Muscle weakness (generalized) - Plan: PT plan of care cert/re-cert     Problem List Patient Active Problem List   Diagnosis Date Noted  . History of adenomatous polyp of colon 07/25/2016  . Upper airway cough syndrome 01/22/2016  . Osteoarthritis, hand 12/16/2014  . Allergic rhinitis 07/24/2014  . Hyperlipidemia   . Obesity, Class I, BMI 30-34.9   . Dizzy spells 08/08/2013  . Diabetes mellitus type II, uncontrolled ([place]) 05/10/2013  . HTN (hypertension), benign 05/10/2013  . Palpitations 05/10/2013      Laureen Abrahams, PT, DPT 01/16/17 4:16 PM    [facility] [place], [place], [address] Phone: [phone]   Fax:  [phone]  Name: Dana Mcmahon MRN: [id number]  Date of Birth: 10/12/1955   PHYSICAL THERAPY DISCHARGE SUMMARY  Visits from Start of Care: 1  Plan: Patient agrees to discharge.  Patient goals were not met. Patient is being discharged due to not returning since the last visit.  ?????      Lyndee Hensen, PT, DPT 3:30 PM  03/23/17

## 2017-01-23 ENCOUNTER — Ambulatory Visit: Payer: Self-pay | Admitting: Gastroenterology

## 2017-01-27 ENCOUNTER — Inpatient Hospital Stay: Admission: RE | Admit: 2017-01-27 | Payer: Self-pay | Source: Ambulatory Visit

## 2017-02-14 ENCOUNTER — Other Ambulatory Visit: Payer: Self-pay | Admitting: Family Medicine

## 2017-04-10 ENCOUNTER — Ambulatory Visit (INDEPENDENT_AMBULATORY_CARE_PROVIDER_SITE_OTHER): Payer: Federal, State, Local not specified - PPO | Admitting: Family Medicine

## 2017-04-10 ENCOUNTER — Encounter: Payer: Self-pay | Admitting: Family Medicine

## 2017-04-10 ENCOUNTER — Ambulatory Visit: Payer: Self-pay | Admitting: *Deleted

## 2017-04-10 VITALS — BP 110/80 | HR 89 | Temp 97.9°F | Ht 63.5 in | Wt 166.2 lb

## 2017-04-10 DIAGNOSIS — R002 Palpitations: Secondary | ICD-10-CM | POA: Diagnosis not present

## 2017-04-10 DIAGNOSIS — E119 Type 2 diabetes mellitus without complications: Secondary | ICD-10-CM

## 2017-04-10 DIAGNOSIS — I10 Essential (primary) hypertension: Secondary | ICD-10-CM | POA: Diagnosis not present

## 2017-04-10 NOTE — Patient Instructions (Addendum)
Please stop by lab before you go If potassium under 4- may have you trial half tablet of HCTZ 25mg  and then recheck blood pressure with me in 2 months. Need to see you at least every 4 months until a1c under 7 consistently.   Hopeful a1c below 7- if above will increase metformin to twice a day again- and give you a chance to work the a1c back down also with healthy eating and regular exercise  Palpitations- will refer to cardiology (asked for MD or DO which may take longer)

## 2017-04-10 NOTE — Assessment & Plan Note (Signed)
S: controlled on hctz 25mg . Has been eating potassium rich foods and taking OTC supplements BP Readings from Last 3 Encounters:  04/10/17 110/80  01/02/17 134/80  09/06/16 118/60  A/P: We discussed blood pressure goal of <140/90. Continue current meds:  May try hctz 12.5mg  if potassium low - would prefer potassium over 4 given palpitations- she is worried this could be causing her issue

## 2017-04-10 NOTE — Progress Notes (Signed)
Subjective:  Dana Mcmahon is a 62 y.o. year old very pleasant female patient who presents for/with See problem oriented charting ROS- has had palpitations associated with fatigue. No fever or chills. No chest pain or shortness of breath. No edema.    Past Medical History-  Patient Active Problem List   Diagnosis Date Noted  . Diabetes mellitus type II, controlled ([place]) 05/10/2013    Priority: High  . History of adenomatous polyp of colon 07/25/2016    Priority: Medium  . Hyperlipidemia     Priority: Medium  . HTN (hypertension), benign 05/10/2013    Priority: Medium  . Palpitations 05/10/2013    Priority: Medium  . Osteoarthritis, hand 12/16/2014    Priority: Low  . Allergic rhinitis 07/24/2014    Priority: Low  . Obesity, Class I, BMI 30-34.9     Priority: Low  . Dizzy spells 08/08/2013    Priority: Low  . Upper airway cough syndrome 01/22/2016    Medications- reviewed and updated Current Outpatient Medications  Medication Sig Dispense Refill  . aspirin EC 81 MG tablet Take 81 mg by mouth daily.    . fexofenadine (ALLEGRA) 30 MG tablet Take 30 mg by mouth 2 (two) times daily as needed.     . fluticasone (FLONASE) 50 MCG/ACT nasal spray Place 2 sprays into both nostrils daily. (Patient taking differently: Place 2 sprays into both nostrils daily as needed. ) 16 g 5  . hydrochlorothiazide (HYDRODIURIL) 25 MG tablet Take 1 tablet (25 mg total) by mouth daily. 90 tablet 1  . metFORMIN (GLUCOPHAGE) 500 MG tablet TAKE 1 TABLET BY MOUTH EVERY DAY WITH THE LARGEST MEAL 90 tablet 0  . Multiple Vitamin (MULTIVITAMIN WITH MINERALS) TABS tablet Take 1 tablet by mouth daily.     No current facility-administered medications for this visit.     Objective: BP 110/80 (BP Location: Left Arm, Patient Position: Sitting, Cuff Size: Large)   Pulse 89   Temp 97.9 F (36.6 C) (Oral)   Ht 5' 3.5" (1.613 m)   Wt 166 lb 3.2 oz (75.4 kg)   SpO2 93%   BMI 28.98 kg/m  Gen: NAD, resting  comfortably No obvious thyromegaly CV: RRR no murmurs rubs or gallops Lungs: CTAB no crackles, wheeze, rhonchi Abdomen: soft/nontender/nondistended/normal bowel sounds.  Ext: no edema Skin: warm, dry  Assessment/Plan:  Diabetes mellitus type II, controlled (Dana Mcmahon) S: well controlled. On metformin 500mg  daily last visit- had planned BID but she improved lifestyle instead. This visit is down 1 lb. Has gym membership- have encouraged her to keep it up. She states exercise has slacked off lately Lab Results  Component Value Date   HGBA1C 6.9 01/02/2017   HGBA1C 7.2 (H) 09/06/2016   HGBA1C 6.9 (H) 04/15/2016   A/P: update a1c today along with cbc, cmp, tsh with fatigue and palpitations  HTN (hypertension), benign S: controlled on hctz 25mg . Has been eating potassium rich foods and taking OTC supplements BP Readings from Last 3 Encounters:  04/10/17 110/80  01/02/17 134/80  09/06/16 118/60  A/P: We discussed blood pressure goal of <140/90. Continue current meds:  May try hctz 12.5mg  if potassium low - would prefer potassium over 4 given palpitations- she is worried this could be causing her issue  Palpitations S: Patient with long history of palpitations. Saw Dr. Doylene Canard back in 2007 or 2008. Has also had workup with Dr. Karlton Lemon of internal medicine in past and later with Dr. Anitra Lauth- who thought ultimately could be anxiety related. Within  past 4 years we have done a holter monitor and echocardiogram. Holter shows PACs and PVCs, echo largely reassuring other than diastolic dysfunction.   Recently,  palpitations have been on almost a dialy basis. Feels fatigued with them. Doesn't recently report dizziness with these as she has had in the past. She wonders if low potassium could be cause A/P: get labs given fatigue and palpitations as per DM section. Ideally potassium above 4. She requests referral to [facility] cardiology and requests to see MD or DO specifically- I have referred her today per her  request (prefers not to see Dr. Doylene Canard again as not as integrated)  Return in about 4 months (around 08/08/2017) for follow up- or sooner if needed.  Lab/Order associations: Controlled type 2 diabetes mellitus without complication, without long-term current use of insulin ([place]) - Plan: CBC, Comprehensive metabolic panel, Hemoglobin A1c  HTN (hypertension), benign - Plan: CBC, Comprehensive metabolic panel  Palpitations - Plan: TSH, CBC, Comprehensive metabolic panel, Ambulatory referral to Cardiology  Return precautions advised.  Dana Reddish, MD

## 2017-04-10 NOTE — Assessment & Plan Note (Signed)
S: Patient with long history of palpitations. Saw Dr. Doylene Canard back in 2007 or 2008. Has also had workup with Dr. Karlton Lemon of internal medicine in past and later with Dr. Anitra Lauth- who thought ultimately could be anxiety related. Within past 4 years we have done a holter monitor and echocardiogram. Holter shows PACs and PVCs, echo largely reassuring other than diastolic dysfunction.   Recently,  palpitations have been on almost a dialy basis. Feels fatigued with them. Doesn't recently report dizziness with these as she has had in the past. She wonders if low potassium could be cause A/P: get labs given fatigue and palpitations as per DM section. Ideally potassium above 4. She requests referral to [facility] cardiology and requests to see MD or DO specifically- I have referred her today per her request (prefers not to see Dr. Doylene Canard again as not as integrated)

## 2017-04-10 NOTE — Assessment & Plan Note (Signed)
S: well controlled. On metformin 500mg  daily last visit- had planned BID but she improved lifestyle instead. This visit is down 1 lb. Has gym membership- have encouraged her to keep it up. She states exercise has slacked off lately Lab Results  Component Value Date   HGBA1C 6.9 01/02/2017   HGBA1C 7.2 (H) 09/06/2016   HGBA1C 6.9 (H) 04/15/2016   A/P: update a1c today along with cbc, cmp, tsh with fatigue and palpitations

## 2017-04-10 NOTE — Telephone Encounter (Signed)
Dana Mcmahon phoned for an appointment because she has been experiencing intermittent palpitations more frequently lately. She has been experiencing some palpitations over the last year or so. Her last appointment with Dr. Yong Channel was 01/02/17 in which this was addressed. She stated she has done better with hydrating. She went on to state she feels the Hydrodiuril might be contributing to the palpitations and that at times she does not take it. Also, addressed her high cholesterol readings and her refusal to take statins. Appointment made for this afternoon.   Reason for Disposition . Palpitations are a chronic symptom (recurrent or ongoing AND present > 4 weeks)  Protocols used: HEART RATE AND HEARTBEAT QUESTIONS-A-AH

## 2017-04-11 LAB — COMPREHENSIVE METABOLIC PANEL
ALK PHOS: 64 U/L (ref 39–117)
ALT: 20 U/L (ref 0–35)
AST: 19 U/L (ref 0–37)
Albumin: 4.3 g/dL (ref 3.5–5.2)
BUN: 20 mg/dL (ref 6–23)
CO2: 27 meq/L (ref 19–32)
CREATININE: 0.77 mg/dL (ref 0.40–1.20)
Calcium: 10 mg/dL (ref 8.4–10.5)
Chloride: 97 mEq/L (ref 96–112)
GFR: 80.67 mL/min (ref >60.00)
GLUCOSE: 93 mg/dL (ref 70–99)
Potassium: 4.6 mEq/L (ref 3.5–5.1)
Sodium: 138 mEq/L (ref 135–145)
TOTAL PROTEIN: 7.6 g/dL (ref 6.0–8.3)
Total Bilirubin: 0.4 mg/dL (ref 0.2–1.2)

## 2017-04-11 LAB — CBC
HCT: 45.3 % (ref 36.0–46.0)
Hemoglobin: 15.3 g/dL — ABNORMAL HIGH (ref 12.0–15.0)
MCHC: 33.7 g/dL (ref 30.0–36.0)
MCV: 85.8 fl (ref 78.0–100.0)
Platelets: 299 10*3/uL (ref 150.0–400.0)
RBC: 5.28 Mil/uL — ABNORMAL HIGH (ref 3.87–5.11)
RDW: 14.3 % (ref 11.5–15.5)
WBC: 6 10*3/uL (ref 4.0–10.5)

## 2017-04-11 LAB — HEMOGLOBIN A1C: Hgb A1c MFr Bld: 7.3 % — ABNORMAL HIGH (ref 4.6–6.5)

## 2017-04-11 LAB — TSH: TSH: 2.32 u[IU]/mL (ref 0.35–4.50)

## 2017-04-12 ENCOUNTER — Other Ambulatory Visit: Payer: Self-pay

## 2017-04-12 MED ORDER — METFORMIN HCL 500 MG PO TABS: 500.0000 mg | ORAL_TABLET | Freq: Two times a day (BID) | ORAL | 1 refills | Status: DC

## 2017-04-21 ENCOUNTER — Encounter: Payer: Self-pay | Admitting: Cardiovascular Disease

## 2017-04-21 ENCOUNTER — Ambulatory Visit: Payer: Federal, State, Local not specified - PPO | Admitting: Cardiovascular Disease

## 2017-04-21 VITALS — BP 126/84 | HR 74 | Ht 64.0 in | Wt 168.0 lb

## 2017-04-21 DIAGNOSIS — I493 Ventricular premature depolarization: Secondary | ICD-10-CM

## 2017-04-21 DIAGNOSIS — I491 Atrial premature depolarization: Secondary | ICD-10-CM

## 2017-04-21 MED ORDER — METOPROLOL SUCCINATE ER 25 MG PO TB24: 12.5000 mg | ORAL_TABLET | Freq: Every day | ORAL | 3 refills | Status: DC

## 2017-04-21 NOTE — Patient Instructions (Signed)
Medication Instructions:  Your physician has recommended you make the following change in your medication: START Toprol (Metoprolol) 12.5 mg once daily   Labwork: None Ordered   Testing/Procedures: None Ordered   Follow-Up: Your physician recommends that you schedule a follow-up appointment in: 3 months with Dr. Acie Fredrickson   If you need a refill on your cardiac medications before your next appointment, please call your pharmacy.   Thank you for choosing [facility]! Christen Bame, RN [phone]

## 2017-04-21 NOTE — Progress Notes (Signed)
Cardiology Office Note:    Date:  04/21/2017   ID:  [name], DOB [birth year], MRN [id number]  PCP:  Marin Olp, MD  Cardiologist:  [place]   Problem List 1. Palpitations  2.  Diabetes mellitus 3.  Hyperlipidemia   Referring MD: Marin Olp, MD   Chief Complaint  Patient presents with  . Palpitations    History of Present Illness:    Dana Mcmahon is a 62 y.o. female with a hx of palpitations. Feels her Heart racing Associated with chest heaviness Last for several minutes, Different times of the day .   Has woken her up from sleep on occasion Exercises reguarly - walks on the treadmill - ,  No issues while working out, she occasionally symptoms right after working out.  These are associated with some mild lightheadedness.  Similar palpitations in 2016 - Holter showed PACs and PVCs   Past Medical History:  Diagnosis Date  . Allergic rhinitis   . Anxiety   . Arthritis   . DM type 2 (diabetes mellitus, type 2) ([place])   . Heart murmur   . Hyperlipidemia    per old records, pt has refused cholesto-lowering meds  . Hypertension   . Neck pain, chronic    Mild DDD, no signif progression (multiple MRI's: 2001, 2003, 2005, 2007)  . Obesity, Class I, BMI 30-34.9   . 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 none      Current Medications: Current Meds  Medication Sig  . aspirin EC 81 MG tablet Take 81 mg by mouth daily.  . DiphenhydrAMINE HCl, Sleep, (UNISOM SLEEPGELS PO) Take 1 tablet by mouth daily.  . fexofenadine (ALLEGRA) 30 MG tablet Take 30 mg by mouth 2 (two) times daily as needed.   . fluticasone (FLONASE) 50 MCG/ACT nasal spray Place 2 sprays into both nostrils daily.  . hydrochlorothiazide (HYDRODIURIL) 25 MG tablet Take 1 tablet (25 mg total) by mouth daily.  . metFORMIN (GLUCOPHAGE) 500 MG tablet Take 1 tablet (500 mg total) by mouth 2 (two) times daily with a meal.  . Multiple Vitamin  (MULTIVITAMIN WITH MINERALS) TABS tablet Take 1 tablet by mouth daily.  Marland Kitchen POTASSIUM PO Take 1 tablet by mouth daily.     Allergies:   Codeine; Penicillins; and Latex   Social History   Socioeconomic History  . Marital status: Married    Spouse name: None  . Number of children: None  . Years of education: None  . Highest education level: None  Social Needs  . Financial resource strain: None  . Food insecurity - worry: None  . Food insecurity - inability: None  . Transportation needs - medical: None  . Transportation needs - non-medical: None  Occupational History  . None  Tobacco Use  . Smoking status: Never Smoker  . Smokeless tobacco: Never Used  Substance and Sexual Activity  . Alcohol use: No  . Drug use: No  . Sexual activity: No    Partners: Male  Other Topics Concern  . None  Social History Narrative   Married, 4 children (daughter Dana Mcmahon patient here, husband Dana Mcmahon, son Dana Mcmahon). 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      Hobbies: time with family, watching news, reading  Family History: The patient's family history includes Hypertension in her mother; Stroke in her brother and mother. There is no history of Colon cancer, Esophageal cancer, Rectal cancer, or Stomach cancer.  ROS:   Please see the history of present illness.     All other systems reviewed and are negative.  EKGs/Labs/Other Studies Reviewed:    The following studies were reviewed today:   EKG:  April 21, 2017:  NSR.  [employer] RBBB   Recent Labs: 04/10/2017: ALT 20; BUN 20; Creatinine, Ser 0.77; Hemoglobin 15.3; Platelets 299.0; Potassium 4.6; Sodium 138; TSH 2.32  Recent Lipid Panel    Component Value Date/Time   CHOL 248 (H) 09/11/2015 0859   TRIG 308.0 (H) 09/11/2015 0859   HDL 47.90 09/11/2015 0859   CHOLHDL 5 09/11/2015 0859   VLDL 61.6 (H) 09/11/2015 0859   LDLDIRECT 112.0  09/06/2016 1718    Physical Exam:    VS:  BP 126/84   Pulse 74   Ht 5\' 4"  (1.626 m)   Wt 168 lb (76.2 kg)   BMI 28.84 kg/m     Wt Readings from Last 3 Encounters:  04/21/17 168 lb (76.2 kg)  04/10/17 166 lb 3.2 oz (75.4 kg)  01/02/17 167 lb (75.8 kg)     GEN:  Well nourished, well developed in no acute distress HEENT: Normal NECK: No JVD; No carotid bruits LYMPHATICS: No lymphadenopathy CARDIAC: RR, no murmurs, rubs, gallops RESPIRATORY:  Clear to auscultation without rales, wheezing or rhonchi  ABDOMEN: Soft, non-tender, non-distended MUSCULOSKELETAL:  No edema; No deformity  SKIN: Warm and dry NEUROLOGIC:  Alert and oriented x 3 PSYCHIATRIC:  Normal affect   ASSESSMENT:    No diagnosis found. PLAN:    In order of problems listed above:  1. Palpitations -Daizy presents with palpitations.  She had a Holter monitor in 2016 that revealed premature ventricular contractions and premature atrial contractions.  Her palpitations are unchanged.  I reassured her that these are benign but they seem to cause her some anxiety.  We will try Toprol XL 12.5 mg a day.  I will see her in 3 months for follow-up visit.   Medication Adjustments/Labs and Tests Ordered: Current medicines are reviewed at length with the patient today.  Concerns regarding medicines are outlined above.  No orders of the defined types were placed in this encounter.  No orders of the defined types were placed in this encounter.   Signed, Mertie Moores, MD  04/21/2017 3:20 PM    [facility]

## 2017-05-31 ENCOUNTER — Emergency Department (HOSPITAL_BASED_OUTPATIENT_CLINIC_OR_DEPARTMENT_OTHER)
Admission: EM | Admit: 2017-05-31 | Discharge: 2017-05-31 | Disposition: A | Discharge status: Home / Self Care | Payer: Federal, State, Local not specified - PPO | Attending: Emergency Medicine | Admitting: Emergency Medicine

## 2017-05-31 ENCOUNTER — Encounter (HOSPITAL_BASED_OUTPATIENT_CLINIC_OR_DEPARTMENT_OTHER): Payer: Self-pay

## 2017-05-31 ENCOUNTER — Emergency Department (HOSPITAL_BASED_OUTPATIENT_CLINIC_OR_DEPARTMENT_OTHER): Payer: Federal, State, Local not specified - PPO

## 2017-05-31 ENCOUNTER — Other Ambulatory Visit: Payer: Self-pay

## 2017-05-31 DIAGNOSIS — R29898 Other symptoms and signs involving the musculoskeletal system: Secondary | ICD-10-CM

## 2017-05-31 DIAGNOSIS — M542 Cervicalgia: Secondary | ICD-10-CM | POA: Diagnosis not present

## 2017-05-31 DIAGNOSIS — I10 Essential (primary) hypertension: Secondary | ICD-10-CM | POA: Insufficient documentation

## 2017-05-31 DIAGNOSIS — Z91040 Latex allergy status: Secondary | ICD-10-CM | POA: Diagnosis not present

## 2017-05-31 DIAGNOSIS — H532 Diplopia: Secondary | ICD-10-CM | POA: Diagnosis not present

## 2017-05-31 DIAGNOSIS — R51 Headache: Secondary | ICD-10-CM | POA: Diagnosis not present

## 2017-05-31 DIAGNOSIS — I160 Hypertensive urgency: Secondary | ICD-10-CM | POA: Insufficient documentation

## 2017-05-31 DIAGNOSIS — M6281 Muscle weakness (generalized): Secondary | ICD-10-CM | POA: Diagnosis not present

## 2017-05-31 DIAGNOSIS — Z7984 Long term (current) use of oral hypoglycemic drugs: Secondary | ICD-10-CM | POA: Insufficient documentation

## 2017-05-31 DIAGNOSIS — E119 Type 2 diabetes mellitus without complications: Secondary | ICD-10-CM | POA: Diagnosis not present

## 2017-05-31 DIAGNOSIS — Z7982 Long term (current) use of aspirin: Secondary | ICD-10-CM | POA: Insufficient documentation

## 2017-05-31 DIAGNOSIS — Z79899 Other long term (current) drug therapy: Secondary | ICD-10-CM | POA: Diagnosis not present

## 2017-05-31 DIAGNOSIS — R002 Palpitations: Secondary | ICD-10-CM | POA: Diagnosis not present

## 2017-05-31 DIAGNOSIS — R531 Weakness: Secondary | ICD-10-CM | POA: Diagnosis not present

## 2017-05-31 LAB — URINALYSIS, ROUTINE W REFLEX MICROSCOPIC
BILIRUBIN URINE: NEGATIVE
GLUCOSE, UA: NEGATIVE mg/dL
HGB URINE DIPSTICK: NEGATIVE
Ketones, ur: NEGATIVE mg/dL
Nitrite: NEGATIVE
PH: 6.5 (ref 5.0–8.0)
Protein, ur: NEGATIVE mg/dL
SPECIFIC GRAVITY, URINE: 1.01 (ref 1.005–1.030)

## 2017-05-31 LAB — CBC WITH DIFFERENTIAL/PLATELET
BASOS PCT: 1 %
Basophils Absolute: 0.1 10*3/uL (ref 0.0–0.1)
EOS PCT: 4 %
Eosinophils Absolute: 0.3 10*3/uL (ref 0.0–0.7)
HEMATOCRIT: 42.1 % (ref 36.0–46.0)
Hemoglobin: 14.8 g/dL (ref 12.0–15.0)
LYMPHS ABS: 1.6 10*3/uL (ref 0.7–4.0)
Lymphocytes Relative: 23 %
MCH: 30 pg (ref 26.0–34.0)
MCHC: 35.2 g/dL (ref 30.0–36.0)
MCV: 85.4 fL (ref 78.0–100.0)
MONO ABS: 1.6 10*3/uL — AB (ref 0.1–1.0)
Monocytes Relative: 23 %
Neutro Abs: 3.2 10*3/uL (ref 1.7–7.7)
Neutrophils Relative %: 49 %
Platelets: 304 10*3/uL (ref 150–400)
RBC: 4.93 MIL/uL (ref 3.87–5.11)
RDW: 13.8 % (ref 11.5–15.5)
WBC: 6.8 10*3/uL (ref 4.0–10.5)

## 2017-05-31 LAB — HEPATIC FUNCTION PANEL
ALK PHOS: 81 U/L (ref 38–126)
ALT: 50 U/L (ref 14–54)
AST: 53 U/L — ABNORMAL HIGH (ref 15–41)
Albumin: 4.2 g/dL (ref 3.5–5.0)
Bilirubin, Direct: 0.5 mg/dL (ref 0.1–0.5)
Indirect Bilirubin: 0.7 mg/dL (ref 0.3–0.9)
TOTAL PROTEIN: 7.7 g/dL (ref 6.5–8.1)
Total Bilirubin: 1.2 mg/dL (ref 0.3–1.2)

## 2017-05-31 LAB — BASIC METABOLIC PANEL
Anion gap: 13 (ref 5–15)
BUN: 13 mg/dL (ref 6–20)
CALCIUM: 8.9 mg/dL (ref 8.9–10.3)
CO2: 24 mmol/L (ref 22–32)
CREATININE: 0.71 mg/dL (ref 0.44–1.00)
Chloride: 93 mmol/L — ABNORMAL LOW (ref 101–111)
GFR calc non Af Amer: >60 mL/min (ref >60)
Glucose, Bld: 128 mg/dL — ABNORMAL HIGH (ref 65–99)
Potassium: 3.8 mmol/L (ref 3.5–5.1)
SODIUM: 130 mmol/L — AB (ref 135–145)

## 2017-05-31 LAB — PROTIME-INR
INR: 0.81
Prothrombin Time: 11.1 seconds — ABNORMAL LOW (ref 11.4–15.2)

## 2017-05-31 LAB — URINALYSIS, MICROSCOPIC (REFLEX): RBC / HPF: NONE SEEN RBC/hpf (ref 0–5)

## 2017-05-31 LAB — CBG MONITORING, ED: Glucose-Capillary: 150 mg/dL — ABNORMAL HIGH (ref 65–99)

## 2017-05-31 LAB — LIPASE, BLOOD: LIPASE: 54 U/L — AB (ref 11–51)

## 2017-05-31 LAB — ETHANOL: Alcohol, Ethyl (B): <10 mg/dL (ref <10)

## 2017-05-31 LAB — TSH: TSH: 3.202 u[IU]/mL (ref 0.350–4.500)

## 2017-05-31 MED ORDER — IOPAMIDOL (ISOVUE-370) INJECTION 76%
100.0000 mL | Freq: Once | INTRAVENOUS | Status: AC | PRN
  Administered 2017-05-31: 100 mL via INTRAVENOUS

## 2017-05-31 MED ORDER — HYDROCHLOROTHIAZIDE 25 MG PO TABS
12.5000 mg | ORAL_TABLET | Freq: Once | ORAL | Status: AC
  Administered 2017-05-31: 12.5 mg via ORAL
  Filled 2017-05-31: qty 1

## 2017-05-31 NOTE — Consult Note (Signed)
[facility] Consult [facility]  Date of service:  05/31/17   Impression:   HTN emergency as likely cause for transient diplopia, migratory arm pain/weakness that started on right side and minutes later involved left side in setting of initial BP 829 systolic with interval sx improvement when BP 160, patient continues to report right arm weakness.  Exam does not suggest a clear pattern of motor deficit at this time.  These sxs do not localize to a vascular territory to support a thrombotic event/TIA.  Recommendations:  defer to ED to determine if inpatient care is needed for BP given how high it was initially and given this was symptomatic  if admitted, defer to Int Med to manage HTN  importance of compliance with anti HTN agents reviewed  advised consideration of resuming aspirin 81mg  qd to reduce risk of CV dz; but I do not find she requires MRI brain, or TIA workup  Discussed with patient and ED physician   Please call with further questions and/or any new concerns ([phone])  ______________________________________________________________   History of Present Illness: 62year old woman with HTN, DM, HLD who presents to ED for horizontal double vision 14:15 (she cannot report if this is monocular or binocular), right arm pain at 14:30 today, followed by right arm weakness about ten minutes, followed by left arm pain/weakness about ten minutes after that.  Her total sx duration was about 31minutes.   There was no associated change in speech/balance, BLE weakness, numbness, vertigo.  She feels "better" now but still reports right UE weakness.  She denies missing her anti HTN agents recently but admits she never started a new anti HTN agent that was recently prescribed.  She does not take aspirin daily; she stopped this one month ago for no particular reason.  She denies prior stroke, CAD.  STAT consult was requested by ED.  Exam:  Vital Signs:   BP initially  846 systolic per ED physician, then 160 without tx per ED physician  Neurological exam:  NIH SS 0 (see below)  1A: Level of Consciousness - Alert; keenly responsive 0 1B: Ask Month and Age - Both Questions Right 0 1C: 'Blink Eyes' & 'Squeeze Hands' - Performs Both Tasks 0 2: Test Horizontal Extraocular Movements - Normal 03: Test Visual Fields - No Visual Loss 04: Test Facial Palsy - Normal symmetry 05A: Test Left Arm Motor Drift - No Drift for 10 Seconds 05B: Test Right Arm Motor Drift - No Drift for 10 Seconds 06A: Test Left Leg Motor Drift - No Drift for 5 Seconds 06B: Test Right Leg Motor Drift - No Drift for 5 Seconds 07: Test Limb Ataxia - No Ataxia 0 9: Test Language/Aphasia - Normal; No aphasia 010: Test Dysarthria - Normal 011: Test Extinction/Inattention - No abnormality 0   no diplopia in all directions of gaze and with primary gaze full extraocular movements without nystagmus  however, nurse reports patient with slightly weaker bicep strength on right while she has fully and symmetric grip/delt on right when compared to left side   Imaging:  CT head:  negative  CTA head/neck:  negativce  L   Medical Data Reviewed:  1.Data reviewed include clinical labs, radiology,  Medical Tests;   2.Tests results discussed with performing or interpreting physician;   3. Reviewed available prior medical records;  4. Obtained case history from another source if applicable/needed;  5. Independent review of image, tracing.   Medical Decision Making:  - Extensive number of diagnosis  or management options are considered above.   - Extensive amount of complex data reviewed.   - High risk of complication and/or morbidity or mortality are associated with differential diagnostic considerations above.  - There may be uncertain outcome and increased probability of prolonged functional impairment or high probability of severe prolonged functional impairment associated with some of these differential  diagnosis.    Patient was informed the Neurology Consult would happen via telehealth (remote video) and consented to receiving care in this manner.

## 2017-05-31 NOTE — Discharge Instructions (Signed)
After your workup today, we suspect hypertension is the main cause of your symptoms.  The neurology team felt you do not have a stroke or a mini stroke.  As your blood pressure has remained stable and improving during her stay, we did not feel strongly you require admission at this time.  Please take your blood pressure medication and follow-up with your doctor in several days.  If any symptoms change or worsen, please return to the nearest emergency department.

## 2017-05-31 NOTE — ED Notes (Signed)
ED Provider at bedside. 

## 2017-05-31 NOTE — ED Triage Notes (Signed)
Pt states she was seated at work-had sudden onset of pain to right UE then to LE-also had inability to grasp cup both hands-cont'd pain to both UE and lower back-denies CP, did have HA earlier today-denies at present

## 2017-05-31 NOTE — ED Notes (Signed)
Awaiting Teleneuro, monitor at bedside

## 2017-05-31 NOTE — ED Notes (Signed)
Patient is resting comfortably. 

## 2017-05-31 NOTE — ED Notes (Signed)
Pt denies any concerns at this time. Pt in NAD. Family at bedside. Pt updated with plan of care

## 2017-05-31 NOTE — ED Provider Notes (Signed)
[facility] EMERGENCY DEPARTMENT Provider Note   CSN: [id number] Arrival date & time: 05/31/17  1523     History   Chief Complaint Chief Complaint  Patient presents with  . Extremity Weakness    HPI Dana Mcmahon is a 62 y.o. female.  The history is provided by the patient, the spouse and medical records.  Extremity Weakness  This is a new problem. The current episode started 1 to 2 hours ago. The problem occurs constantly. The problem has been resolved. Pertinent negatives include no chest pain, no abdominal pain, no headaches and no shortness of breath. The symptoms are aggravated by twisting. Nothing relieves the symptoms. She has tried nothing for the symptoms. The treatment provided no relief.    Past Medical History:  Diagnosis Date  . Allergic rhinitis   . Anxiety   . Arthritis   . DM type 2 (diabetes mellitus, type 2) ([place])   . Heart murmur   . Hyperlipidemia    per old records, pt has refused cholesto-lowering meds  . Hypertension   . Neck pain, chronic    Mild DDD, no signif progression (multiple MRI's: 2001, 2003, 2005, 2007)  . Obesity, Class I, BMI 30-34.9   . 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 History of adenomatous polyp of colon 07/25/2016  . Upper airway cough syndrome 01/22/2016  . Osteoarthritis, hand 12/16/2014  . Allergic rhinitis 07/24/2014  . Hyperlipidemia   . Obesity, Class I, BMI 30-34.9   . Dizzy spells 08/08/2013  . Diabetes mellitus type II, controlled ([place]) 05/10/2013  . HTN (hypertension), benign 05/10/2013  . Palpitations 05/10/2013    Past Surgical History:  Procedure Laterality Date  . none       OB History    Gravida  4   Para  4   Term  4   Preterm      AB      Living  4     SAB      TAB      Ectopic      Multiple      Live Births  4            Home Medications    Prior to Admission medications   Medication Sig Start Date  End Date Taking? Authorizing Provider  hydrochlorothiazide (HYDRODIURIL) 25 MG tablet Take 12.5 mg by mouth daily.   Yes [provider]  metFORMIN (GLUCOPHAGE) 500 MG tablet Take 1 tablet (500 mg total) by mouth 2 (two) times daily with a meal. 04/12/17  Yes Marin Olp, MD  aspirin EC 81 MG tablet Take 81 mg by mouth daily.    [provider]  DiphenhydrAMINE HCl, Sleep, (UNISOM SLEEPGELS PO) Take 1 tablet by mouth daily.    [provider]  fexofenadine (ALLEGRA) 30 MG tablet Take 30 mg by mouth 2 (two) times daily as needed.     [provider]  fluticasone (FLONASE) 50 MCG/ACT nasal spray Place 2 sprays into both nostrils daily. 03/23/15   Marin Olp, MD  metoprolol succinate (TOPROL XL) 25 MG 24 hr tablet Take 0.5 tablets (12.5 mg total) by mouth daily. 04/21/17   Nahser, Wonda Cheng, MD  Multiple Vitamin (MULTIVITAMIN WITH MINERALS) TABS tablet Take 1 tablet by mouth daily.    [provider]  POTASSIUM PO Take 1 tablet by mouth daily.    [provider]    Family History Family History  Problem Relation Age of Onset  . Stroke Mother        43s, and father 49s  . Hypertension Mother        and father  . Stroke Brother        in 59s  . Colon cancer Neg Hx   . Esophageal cancer Neg Hx   . Rectal cancer Neg Hx   . Stomach cancer Neg Hx     Social History Social History   Tobacco Use  . Smoking status: Never Smoker  . Smokeless tobacco: Never Used  Substance Use Topics  . Alcohol use: No  . Drug use: No     Allergies   Codeine; Penicillins; and Latex   Review of Systems Review of Systems  Constitutional: Negative for chills, diaphoresis, fatigue and unexpected weight change.  HENT: Negative for congestion.   Eyes: Positive for visual disturbance.  Respiratory: Negative for cough, chest tightness and shortness of breath.   Cardiovascular: Positive for palpitations. Negative for chest pain and leg swelling.    Gastrointestinal: Negative for abdominal pain, constipation, diarrhea, nausea and vomiting.  Genitourinary: Negative for dysuria and flank pain.  Musculoskeletal: Positive for extremity weakness and neck pain. Negative for back pain and neck stiffness.  Skin: Negative for rash and wound.  Neurological: Positive for weakness and light-headedness. Negative for dizziness, seizures, facial asymmetry, speech difficulty, numbness and headaches.  Psychiatric/Behavioral: Negative for agitation and confusion.  All other systems reviewed and are negative.    Physical Exam Updated Vital Signs BP (!) 209/113 (BP Location: Right Arm)   Pulse 89   Temp 98.5 F (36.9 C) (Oral)   Resp 20   Ht 5\' 4"  (1.626 m)   Wt 75.8 kg (167 lb)   SpO2 96%   BMI 28.67 kg/m   Physical Exam  Constitutional: She is oriented to person, place, and time. She appears well-developed and well-nourished. No distress.  HENT:  Head: Normocephalic and atraumatic.  Nose: Nose normal.  Mouth/Throat: Oropharynx is clear and moist. No oropharyngeal exudate.  Eyes: Pupils are equal, round, and reactive to light. Conjunctivae and EOM are normal.  Neck: Normal range of motion. Neck supple.  Cardiovascular: Intact distal pulses.  No murmur heard. Pulmonary/Chest: Effort normal and breath sounds normal. No stridor. No respiratory distress. She has no wheezes. She exhibits no tenderness.  Abdominal: Soft. Bowel sounds are normal. She exhibits no distension. There is no tenderness.  Musculoskeletal: Normal range of motion. She exhibits no edema or tenderness.  Lymphadenopathy:    She has no cervical adenopathy.  Neurological: She is alert and oriented to person, place, and time. She is not disoriented. She displays no tremor and normal reflexes. No cranial nerve deficit or sensory deficit. She exhibits normal muscle tone. Coordination normal. GCS eye subscore is 4. GCS verbal subscore is 5. GCS motor subscore is 6.  Skin:  Capillary refill takes less than 2 seconds. No rash noted. She is not diaphoretic. No erythema.  Psychiatric: She has a normal mood and affect.  Nursing note and vitals reviewed.    ED Treatments / Results  Labs (all labs ordered are listed, but only abnormal results are displayed) Labs Reviewed  CBC WITH DIFFERENTIAL/PLATELET - Abnormal; Notable for the following components:      Result Value   Monocytes Absolute 1.6 (*)    All other components within normal limits  URINALYSIS, ROUTINE W REFLEX MICROSCOPIC - Abnormal; Notable for the  following components:   Leukocytes, UA TRACE (*)    All other components within normal limits  PROTIME-INR - Abnormal; Notable for the following components:   Prothrombin Time 11.1 (*)    All other components within normal limits  URINALYSIS, MICROSCOPIC (REFLEX) - Abnormal; Notable for the following components:   Bacteria, UA RARE (*)    Squamous Epithelial / LPF 0-5 (*)    All other components within normal limits  BASIC METABOLIC PANEL - Abnormal; Notable for the following components:   Sodium 130 (*)    Chloride 93 (*)    Glucose, Bld 128 (*)    All other components within normal limits  LIPASE, BLOOD - Abnormal; Notable for the following components:   Lipase 54 (*)    All other components within normal limits  HEPATIC FUNCTION PANEL - Abnormal; Notable for the following components:   AST 53 (*)    All other components within normal limits  CBG MONITORING, ED - Abnormal; Notable for the following components:   Glucose-Capillary 150 (*)    All other components within normal limits  URINE CULTURE  TSH  ETHANOL    EKG EKG Interpretation  Date/Time:  Wednesday May 31 2017 15:35:15 EDT Ventricular Rate:  72 PR Interval:    QRS Duration: 98 QT Interval:  427 QTC Calculation: 468 R Axis:   -66 Text Interpretation:  Sinus rhythm Markedly posterior QRS axis RSR' in V1 or V2, probably normal variant Baseline wander in lead(s) V3  Wandering baseline.  NO prior ECG for comparison.  No STEMI Confirmed by Antony Blackbird [phone]) on 05/31/2017 3:54:14 PM   Radiology Ct Angio Head W Or Wo Contrast  Result Date: 05/31/2017 CLINICAL DATA:  Acute onset pain and limited use of RIGHT and subsequently LEFT extremities. Low back pain. Headache. Assess diplopia. History of hypertension, hyperlipidemia. EXAM: CT ANGIOGRAPHY HEAD AND NECK TECHNIQUE: Multidetector CT imaging of the head and neck was performed using the standard protocol during bolus administration of intravenous contrast. Multiplanar CT image reconstructions and MIPs were obtained to evaluate the vascular anatomy. Carotid stenosis measurements (when applicable) are obtained utilizing NASCET criteria, using the distal internal carotid diameter as the denominator. CONTRAST:  170mL ISOVUE-370 IOPAMIDOL (ISOVUE-370) INJECTION 76% COMPARISON:  CT HEAD March 26, 2017 FINDINGS: CT HEAD FINDINGS BRAIN: No intraparenchymal hemorrhage, mass effect nor midline shift. The ventricles and sulci are normal for age. Patchy supratentorial white matter hypodensities within normal range for patient's age, though non-specific are most compatible with chronic small vessel ischemic disease. No acute large vascular territory infarcts. No abnormal extra-axial fluid collections. Basal cisterns are patent. VASCULAR: Moderate calcific atherosclerosis of the carotid siphons. SKULL: No skull fracture. No significant scalp soft tissue swelling. SINUSES/ORBITS: The mastoid air-cells and included paranasal sinuses are well-aerated.The included ocular globes and orbital contents are non-suspicious. OTHER: None. CTA NECK FINDINGS: AORTIC ARCH: Normal appearance of the thoracic arch, normal branch pattern. Trace calcific atherosclerosis aortic arch. The origins of the innominate, left Common carotid artery and subclavian artery are widely patent. RIGHT CAROTID SYSTEM: Common carotid artery is widely patent, mildly  tortuous retropharyngeal course. Mild calcific atherosclerosis of the carotid bifurcation without hemodynamically significant stenosis by NASCET criteria. Patent internal carotid artery with multiple folds associated with chronic hypertension. LEFT CAROTID SYSTEM: Common carotid artery is widely patent, coursing in a straight line fashion. Mild calcific atherosclerosis carotid bifurcation without hemodynamically significant stenosis by NASCET criteria. Patent internal carotid artery with multiple folds associated with chronic hypertension. VERTEBRAL ARTERIES:Codominant vertebral  arteries. Normal appearance of the vertebral arteries, widely patent. SKELETON: No acute osseous process though bone windows have not been submitted. 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 No significant neural foraminal narrowing. OTHER NECK: Soft tissues of the neck are nonacute though, not tailored for evaluation. UPPER CHEST: Included lung apices are clear. No superior mediastinal lymphadenopathy. Enlarged main pulmonary artery is 3.7 cm associated with chronic pulmonary arterial hypertension. CTA HEAD FINDINGS: ANTERIOR CIRCULATION: Patent cervical internal carotid arteries, petrous, cavernous and supra clinoid internal carotid arteries. Patent anterior communicating artery. Patent anterior and middle cerebral arteries. No large vessel occlusion, significant stenosis, contrast extravasation or aneurysm. POSTERIOR CIRCULATION: Patent vertebral arteries, vertebrobasilar junction and basilar artery, as well as main branch vessels. Patent posterior cerebral arteries. Robust RIGHT posterior communicating artery present. No large vessel occlusion, significant stenosis, contrast extravasation or aneurysm. VENOUS SINUSES: Major dural venous sinuses are patent though not tailored for evaluation on this angiographic examination. ANATOMIC VARIANTS: None. DELAYED PHASE: No abnormal intracranial enhancement. MIP  images reviewed. IMPRESSION: CTA NECK: 1. No hemodynamically significant stenosis or acute vascular process in the neck. 2. Findings of chronic hypertension. CTA HEAD: 1. No emergent large vessel occlusion or flow limiting stenosis. Electronically Signed   By: Elon Alas M.D.   On: 05/31/2017 18:14   Dg Chest 2 View  Result Date: 05/31/2017 CLINICAL DATA:  Palpitations EXAM: CHEST - 2 VIEW COMPARISON:  01/22/2016 chest radiograph. FINDINGS: Stable cardiomediastinal silhouette with normal heart size. No pneumothorax. No pleural effusion. Lungs appear clear, with no acute consolidative airspace disease and no pulmonary edema. IMPRESSION: No active cardiopulmonary disease. Electronically Signed   By: Ilona Sorrel M.D.   On: 05/31/2017 18:17   Ct Angio Neck W And/or Wo Contrast  Result Date: 05/31/2017 CLINICAL DATA:  Acute onset pain and limited use of RIGHT and subsequently LEFT extremities. Low back pain. Headache. Assess diplopia. History of hypertension, hyperlipidemia. EXAM: CT ANGIOGRAPHY HEAD AND NECK TECHNIQUE: Multidetector CT imaging of the head and neck was performed using the standard protocol during bolus administration of intravenous contrast. Multiplanar CT image reconstructions and MIPs were obtained to evaluate the vascular anatomy. Carotid stenosis measurements (when applicable) are obtained utilizing NASCET criteria, using the distal internal carotid diameter as the denominator. CONTRAST:  148mL ISOVUE-370 IOPAMIDOL (ISOVUE-370) INJECTION 76% COMPARISON:  CT HEAD March 26, 2017 FINDINGS: CT HEAD FINDINGS BRAIN: No intraparenchymal hemorrhage, mass effect nor midline shift. The ventricles and sulci are normal for age. Patchy supratentorial white matter hypodensities within normal range for patient's age, though non-specific are most compatible with chronic small vessel ischemic disease. No acute large vascular territory infarcts. No abnormal extra-axial fluid collections. Basal  cisterns are patent. VASCULAR: Moderate calcific atherosclerosis of the carotid siphons. SKULL: No skull fracture. No significant scalp soft tissue swelling. SINUSES/ORBITS: The mastoid air-cells and included paranasal sinuses are well-aerated.The included ocular globes and orbital contents are non-suspicious. OTHER: None. CTA NECK FINDINGS: AORTIC ARCH: Normal appearance of the thoracic arch, normal branch pattern. Trace calcific atherosclerosis aortic arch. The origins of the innominate, left Common carotid artery and subclavian artery are widely patent. RIGHT CAROTID SYSTEM: Common carotid artery is widely patent, mildly tortuous retropharyngeal course. Mild calcific atherosclerosis of the carotid bifurcation without hemodynamically significant stenosis by NASCET criteria. Patent internal carotid artery with multiple folds associated with chronic hypertension. LEFT CAROTID SYSTEM: Common carotid artery is widely patent, coursing in a straight line fashion. Mild calcific atherosclerosis carotid bifurcation without hemodynamically significant stenosis by NASCET  criteria. Patent internal carotid artery with multiple folds associated with chronic hypertension. VERTEBRAL ARTERIES:Codominant vertebral arteries. Normal appearance of the vertebral arteries, widely patent. SKELETON: No acute osseous process though bone windows have not been submitted. 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 No significant neural foraminal narrowing. OTHER NECK: Soft tissues of the neck are nonacute though, not tailored for evaluation. UPPER CHEST: Included lung apices are clear. No superior mediastinal lymphadenopathy. Enlarged main pulmonary artery is 3.7 cm associated with chronic pulmonary arterial hypertension. CTA HEAD FINDINGS: ANTERIOR CIRCULATION: Patent cervical internal carotid arteries, petrous, cavernous and supra clinoid internal carotid arteries. Patent anterior communicating artery. Patent  anterior and middle cerebral arteries. No large vessel occlusion, significant stenosis, contrast extravasation or aneurysm. POSTERIOR CIRCULATION: Patent vertebral arteries, vertebrobasilar junction and basilar artery, as well as main branch vessels. Patent posterior cerebral arteries. Robust RIGHT posterior communicating artery present. No large vessel occlusion, significant stenosis, contrast extravasation or aneurysm. VENOUS SINUSES: Major dural venous sinuses are patent though not tailored for evaluation on this angiographic examination. ANATOMIC VARIANTS: None. DELAYED PHASE: No abnormal intracranial enhancement. MIP images reviewed. IMPRESSION: CTA NECK: 1. No hemodynamically significant stenosis or acute vascular process in the neck. 2. Findings of chronic hypertension. CTA HEAD: 1. No emergent large vessel occlusion or flow limiting stenosis. Electronically Signed   By: Elon Alas M.D.   On: 05/31/2017 18:14    Procedures Procedures (including critical care time)  Medications Ordered in ED Medications  iopamidol (ISOVUE-370) 76 % injection 100 mL (100 mLs Intravenous Contrast Given 05/31/17 1741)  hydrochlorothiazide (HYDRODIURIL) tablet 12.5 mg (12.5 mg Oral Given 05/31/17 2116)     Initial Impression / Assessment and Plan / ED Course  I have reviewed the triage vital signs and the nursing notes.  Pertinent labs & imaging results that were available during my care of the patient were reviewed by me and considered in my medical decision making (see chart for details).     Dana Mcmahon is a 63 y.o. female with a past medical history significant for hypertension and diabetes who presents with bilateral arm pain, bilateral arm weakness, transient diplopia, neck pain, and palpitations.  Patient reports that she had a headache earlier today that came and went.  She reports that it was mild to moderate in her frontal head.  She says that she was at work and at approximately 2:45 PM  noticed she was having weakness in her bilateral hands.  She says it started on the right side than with the left.  She reports that she was having pain in her shoulders and arms going towards her neck.  She denies any recent neck injury or head injury.  She denies any recent massages or chiropractor manipulation of her neck.  She reports that the pain is worsened with some movement.  She reports that when her symptoms began, she had 10 minutes of transient horizontal diplopia.  She never had that before.  She reports some lightheadedness but denied room spinning dizziness.  She denied nausea, vomiting, chest pain, or shortness of breath.  She does report some palpitations resolved.  She denied any nausea, vomiting, constipation, diarrhea, or dysuria ongoing.  She denies any leg symptoms.  She denies any speech abnormalities or facial droop.  She denies any history of stroke.  Patient reports her symptoms have been waxing and waning.  In triage, patient's blood pressure was elevated over 244 systolic.  Patient reports this is the highest it is ever  been.  She reports that although metoprolol was on the chart, she has never taken it.     on exam, patient had no weakness in her arms.  Grip strength was symmetric bilaterally.  Patient had no arm drift with holding her arms out with eyes closed for 10 seconds.  Normal finger-nose-finger testing.  Normal sensation in arms and face.  No facial droop.  Pupil exam unremarkable with normal extraocular movements.  Legs had normal strength and sensation.  Normal reflexes.  Back and chest and abdomen are nontender.  No CVA tenderness.  Patient appears well.  As the patient's symptoms have been waxing and waning and her neurologic exam is unremarkable for me, I do not feel patient is a code stroke.  Given the patient's reported diplopia that was transient as well as the weakness symptoms, patient will have CTA of the head and neck and CT of the head.  Patient will have  screening laboratory testing as well.  Anticipate speaking with the tele-neurology team to determine the recommend agents.  Anticipate patient will need MRI for further management.  Patient's blood pressure began to improve in the emergency department without medications.  If patient's blood pressure rises, will consider IV medications to treat it has symptomatic hypertension or complicated migraine are considered etiologies.  6:36 PM Patient's blood pressure has continued to improve.  Is now in the 272 systolic.  Patient's headache is now 2 out of 10 in her weakness is improved.  She denies any further diplopia.  CTA of head and neck showed no acute ab normalities.  Chest x-ray reassuring.  Laboratory testing overall reassuring aside from mild hyponatremia.   lipase and hepatic function panel are still in process.  Next  Given the patient's transient diplopia and her transient bilateral arm weakness by report, the tele-neurology team will be consulted for recommendations.  Tele-neurology did not feel patient had TIA or stroke.  Suspect was related to blood pressure.  Patient blood pressure was monitored and continued to improve into the 536 systolic.  Patient had no further headache or any neurologic symptoms.  Neurology recommended that if her blood pressure was uncontrolled, she would benefit from an observation admission by medicine for hypertensive emergency.  Patient does not want to be admitted and given her improved symptoms she feels comfortable going home.  Patient understands return precautions and will take her blood pressure medicine.  Patient had no other questions or concerns and was discharged in good condition with resolved symptoms.   Final Clinical Impressions(s) / ED Diagnoses   Final diagnoses:  Hypertension, unspecified type  Hypertensive urgency  Transient diplopia  Bilateral arm weakness    ED Discharge Orders    None     Clinical Impression: 1. Hypertension,  unspecified type   2. Hypertensive urgency   3. Transient diplopia   4. Bilateral arm weakness     Disposition: Discharge  Condition: Good  I have discussed the results, Dx and Tx plan with the pt(& family if present). He/she/they expressed understanding and agree(s) with the plan. Discharge instructions discussed at great length. Strict return precautions discussed and pt &/or family have verbalized understanding of the instructions. No further questions at time of discharge.    Discharge Medication List as of 05/31/2017  9:33 PM      Follow Up: Marin Olp, [place] [address] [phone]     [facility] [place] [facility] [id number]  [place]       Allyne Hebert, Gwenyth Allegra, MD 06/01/17 [phone]

## 2017-05-31 NOTE — ED Notes (Signed)
Teleneuro consult in progress 

## 2017-05-31 NOTE — ED Notes (Signed)
Patient transported to CT 

## 2017-06-01 ENCOUNTER — Other Ambulatory Visit: Payer: Self-pay | Admitting: Family Medicine

## 2017-06-01 LAB — URINE CULTURE: CULTURE: NO GROWTH

## 2017-06-02 ENCOUNTER — Ambulatory Visit: Payer: Self-pay | Admitting: Family Medicine

## 2017-06-02 ENCOUNTER — Encounter: Payer: Self-pay | Admitting: Family Medicine

## 2017-06-02 ENCOUNTER — Ambulatory Visit: Payer: Federal, State, Local not specified - PPO | Admitting: Family Medicine

## 2017-06-02 ENCOUNTER — Encounter: Payer: Self-pay | Admitting: Neurology

## 2017-06-02 VITALS — BP 122/76 | HR 72 | Temp 98.3°F | Ht 64.0 in | Wt 168.4 lb

## 2017-06-02 DIAGNOSIS — R519 Headache, unspecified: Secondary | ICD-10-CM

## 2017-06-02 DIAGNOSIS — E119 Type 2 diabetes mellitus without complications: Secondary | ICD-10-CM

## 2017-06-02 DIAGNOSIS — R51 Headache: Secondary | ICD-10-CM

## 2017-06-02 DIAGNOSIS — M316 Other giant cell arteritis: Secondary | ICD-10-CM | POA: Diagnosis not present

## 2017-06-02 DIAGNOSIS — R29898 Other symptoms and signs involving the musculoskeletal system: Secondary | ICD-10-CM | POA: Diagnosis not present

## 2017-06-02 DIAGNOSIS — I10 Essential (primary) hypertension: Secondary | ICD-10-CM | POA: Diagnosis not present

## 2017-06-02 DIAGNOSIS — H532 Diplopia: Secondary | ICD-10-CM | POA: Diagnosis not present

## 2017-06-02 LAB — C-REACTIVE PROTEIN: CRP: 0.6 mg/dL (ref 0.5–20.0)

## 2017-06-02 LAB — SEDIMENTATION RATE

## 2017-06-02 NOTE — Patient Instructions (Addendum)
We recommend the [employer] home blood pressure monitor. Please check your blood pressure at the same time of day and also remember to use the same arm. We recommend the left arm. Please bring your cuff to your next appointment and we will be happy to compare it to a manual reading for you.  Please stop by lab before you go  Please schedule a visit with me in 2-3 weeks. Bring your FMLA paperwork in with you and we will review this together. Talk to HR. I think taking a day off a week could help with stress reduction.   I think there is a high chance stress contributed to your symptoms but we want to be thorough and rule out other things. They did a great workup in ER. I also want one of your excellent local neurologists to evaluate you as well. Finally, call your eye doctor for follow up to get his opinion on the double vision.

## 2017-06-02 NOTE — Progress Notes (Signed)
Subjective:  Dana Mcmahon is a 61 y.o. year old very pleasant female patient who presents for/with See problem oriented charting ROS- no chest pain or shortness of breath. Has had fatigue, double vision, arm pain and weakness. No fever or chills. No vomiting   Past Medical History-  Patient Active Problem List   Diagnosis Date Noted  . Temporal arteritis syndrome ([place]) 06/03/2017    Priority: High  . Diabetes mellitus type II, controlled ([place]) 05/10/2013    Priority: High  . History of adenomatous polyp of colon 07/25/2016    Priority: Medium  . Hyperlipidemia     Priority: Medium  . HTN (hypertension), benign 05/10/2013    Priority: Medium  . Palpitations 05/10/2013    Priority: Medium  . Osteoarthritis, hand 12/16/2014    Priority: Low  . Allergic rhinitis 07/24/2014    Priority: Low  . Obesity, Class I, BMI 30-34.9     Priority: Low  . Dizzy spells 08/08/2013    Priority: Low  . Upper airway cough syndrome 01/22/2016    Medications- reviewed and updated Current Outpatient Medications  Medication Sig Dispense Refill  . aspirin EC 81 MG tablet Take 81 mg by mouth daily.    . DiphenhydrAMINE HCl, Sleep, (UNISOM SLEEPGELS PO) Take 1 tablet by mouth daily.    . fexofenadine (ALLEGRA) 30 MG tablet Take 30 mg by mouth 2 (two) times daily as needed.     . fluticasone (FLONASE) 50 MCG/ACT nasal spray Place 2 sprays into both nostrils daily. 16 g 5  . hydrochlorothiazide (HYDRODIURIL) 25 MG tablet Take 12.5 mg by mouth daily.    . metFORMIN (GLUCOPHAGE) 500 MG tablet Take 1 tablet (500 mg total) by mouth 2 (two) times daily with a meal. 90 tablet 1  . metoprolol succinate (TOPROL XL) 25 MG 24 hr tablet Take 0.5 tablets (12.5 mg total) by mouth daily. 45 tablet 3  . Multiple Vitamin (MULTIVITAMIN WITH MINERALS) TABS tablet Take 1 tablet by mouth daily.    Marland Kitchen POTASSIUM PO Take 1 tablet by mouth daily.     No current facility-administered medications for this visit.      Objective: BP 122/76 (BP Location: Left Arm, Patient Position: Sitting, Cuff Size: Large)   Pulse 72   Temp 98.3 F (36.8 C) (Oral)   Ht '5\' 4"'  (1.626 m)   Wt 168 lb 6.4 oz (76.4 kg)   SpO2 94%   BMI 28.91 kg/m  Gen: NAD, resting comfortably ENT: Mucous membranes are moist. PERRLA CV: RRR no murmurs rubs or gallops Lungs: CTAB no crackles, wheeze, rhonchi Abdomen: soft/nontender/nondistended/normal bowel sounds.  Ext: no edema Skin: warm, dry Neuro: CN II-XII intact, sensation and reflexes normal throughout, 5/5 muscle strength in bilateral upper and lower extremities. Normal finger to nose. Normal rapid alternating movements. No pronator drift. Normal romberg. Normal gait.   Assessment/Plan:  HTN (hypertension), benign Blood pressure elevations yesterday thought to be contributing to her symptoms. She is on hctz and is supposed to be on metoprolol- she agrees to start the metoprolol today- especially with starting prednisone could raise BP- we have follow up to recheck in a few weeks  Diabetes mellitus type II, controlled ([place]) Increase metformin from 547m BID to 10048mBID as we start prednisone- she may need more rx. Will need to get her set up with a meter.   Tem[place]itis syndrome [place]: Patient seen in the ER on 05/31/17. She does not normally get prolonged headaches (occasional short lived sharp  pains) but she had a headachea few days prior- worsened in AM of ER trip. She states all over left side of head- points at temple area, behind this and into forehead. Around 3 pm she felt weak in both of her arms and into neck/shoulders with some associated pain- bilateral ut started on right side. No recent trauma/injury. No recent massage or manipulation. She also had 10 minutes of double vision that then resolved. She reported to ED physicians that she had not had that before but today she tells me sometimes if looks at computer screen for a while will happen for a few  seconds but nothing this prolonged in the past. Eye doctor not aware of double vision issues. She had no lower extremity symptoms. Symptoms seemed to wax and wane in intensity.   Also was noted to have BP to over 973 systolic. She has bene taking hctz but not metoprolol. She had a reassuring neurological exam in the ED- no objective weakness. She had a CTA of her head and neck and CT of head. I independently reviewed her CTA of the head and no acute intracranial abnormality noted. I also reviewed her chest x-ray which showed no acute or chronic cardiopulmonary disease.   ED provider recommended neurological evaluation and consideration of MRI. Bp improved to 160 while in ED without intervention. Headache improved in intensity to 2/10. Tele neurology did not feel patient had Tia or stroke- they thought symptoms were related to high BP. SBP down to 150s before discharge. Report states no further headaches at time of discharge but patient today states she does not recall full resolution of headache- she still has a 2-3/10 headache today in same area described above. She states weakness essentially resolved but still has some pain through shoulders and arms- mild soreness.   Patient admitted observation admission for hypertensive emergency but patient declined and called for close follow up with [place] here.   A/P: From phone note "Discussed with Dana Mcmahon by phone my concern of temporal arteritis with elevated ESR at 49. CRP is normal. I do not strongly suspect temporal arteritis but with new left sided headache that is over temple area, double vision she experienced, ESR so close to 50- I think we have to evaluate and treat for this as reasonably high on differential. She also had some weakness and pain in upper extremities- possible polymyalgia rheumatica but I doubt this. These symptoms could be anxiety related as she often has physical manifestations of stress but with risk for potential vision loss we will do  the following - urgent referral to general surgery for temporal artery biopsy - urgent referral to rheumatology for management -start prednisone 57m - with 1 month rx to start - also see diabetes and hypertension sections for management there"  Original plan from avs included home monitoring of BP, labwork and " please schedule a visit with me in 2-3 weeks. Bring your FMLA paperwork in with you and we will review this together. Talk to HR. I think taking a day off a week could help with stress reduction.   I think there is a high chance stress contributed to your symptoms but we want to be thorough and rule out other things. They did a great workup in ER. I also want one of your excellent local neurologists to evaluate you as well. Finally, call your eye doctor for follow up to get his opinion on the double vision. "   Future Appointments  Date Time Provider  [place]  06/16/2017  4:15 PM Marin Olp, MD [facility]  08/07/2017  4:20 PM Nahser, Wonda Cheng, MD [facility]  08/11/2017  3:30 PM Marin Olp, MD [facility]  09/21/2017  3:00 PM Pieter Partridge, DO [facility] None   Time Stamp The duration of face-to-face time during this visit was greater than 40 (1:32 PM- 2:12 PM) minutes. Greater than 50% of this time was spent in counseling, explanation of diagnosis, planning of further management, and/or coordination of care including discussion of potential causes, distress of the encounter, wokrup needed  Lab/Order associations: Frontal headache - Plan: Sedimentation rate, C-reactive protein, Ambulatory referral to Neurology, Ambulatory referral to General Surgery, Ambulatory referral to Rheumatology  Double vision - Plan: Sedimentation rate, C-reactive protein, Ambulatory referral to Neurology, Ambulatory referral to General Surgery, Ambulatory referral to Rheumatology  Weakness of both lower extremities - Plan: Ambulatory referral to Neurology  Controlled type  2 diabetes mellitus without complication, without long-term current use of insulin ([place])  Temporal arteritis syndrome ([place]) - Plan: Ambulatory referral to General Surgery, Ambulatory referral to Rheumatology  Meds ordered this encounter  Medications  . predniSONE (DELTASONE) 50 MG tablet    Sig: Take 1 tablet (50 mg total) by mouth daily with breakfast.    Dispense:  30 tablet    Refill:  0  . metFORMIN (GLUCOPHAGE) 1000 MG tablet    Sig: Take 1 tablet (1,000 mg total) by mouth 2 (two) times daily with a meal.    Dispense:  180 tablet    Refill:  3    Return precautions advised.  Garret Reddish, MD

## 2017-06-03 DIAGNOSIS — R519 Headache, unspecified: Secondary | ICD-10-CM | POA: Insufficient documentation

## 2017-06-03 DIAGNOSIS — R51 Headache: Secondary | ICD-10-CM

## 2017-06-03 MED ORDER — PREDNISONE 50 MG PO TABS: 50.0000 mg | ORAL_TABLET | Freq: Every day | ORAL | 0 refills | Status: AC

## 2017-06-03 MED ORDER — METFORMIN HCL 1000 MG PO TABS: 1000.0000 mg | ORAL_TABLET | Freq: Two times a day (BID) | ORAL | 3 refills | Status: DC

## 2017-06-03 NOTE — Assessment & Plan Note (Signed)
S: Patient seen in the ER on 05/31/17. She does not normally get prolonged headaches (occasional short lived sharp pains) but she had a headachea few days prior- worsened in AM of ER trip. She states all over left side of head- points at temple area, behind this and into forehead. Around 3 pm she felt weak in both of her arms and into neck/shoulders with some associated pain- bilateral ut started on right side. No recent trauma/injury. No recent massage or manipulation. She also had 10 minutes of double vision that then resolved. She reported to ED physicians that she had not had that before but today she tells me sometimes if looks at computer screen for a while will happen for a few seconds but nothing this prolonged in the past. Eye doctor not aware of double vision issues. She had no lower extremity symptoms. Symptoms seemed to wax and wane in intensity.   Also was noted to have BP to over 102 systolic. She has bene taking hctz but not metoprolol. She had a reassuring neurological exam in the ED- no objective weakness. She had a CTA of her head and neck and CT of head. I independently reviewed her CTA of the head and no acute intracranial abnormality noted. I also reviewed her chest x-ray which showed no acute or chronic cardiopulmonary disease.   ED provider recommended neurological evaluation and consideration of MRI. Bp improved to 160 while in ED without intervention. Headache improved in intensity to 2/10. Tele neurology did not feel patient had Tia or stroke- they thought symptoms were related to high BP. SBP down to 150s before discharge. Report states no further headaches at time of discharge but patient today states she does not recall full resolution of headache- she still has a 2-3/10 headache today in same area described above. She states weakness essentially resolved but still has some pain through shoulders and arms- mild soreness.   Patient admitted observation admission for hypertensive  emergency but patient declined and called for close follow up with [place] here.   A/P: From phone note "Discussed with Mrs. Mongiello by phone my concern of temporal arteritis with elevated ESR at 49. CRP is normal. I do not strongly suspect temporal arteritis but with new left sided headache that is over temple area, double vision she experienced, ESR so close to 50- I think we have to evaluate and treat for this as reasonably high on differential. She also had some weakness and pain in upper extremities- possible polymyalgia rheumatica but I doubt this. These symptoms could be anxiety related as she often has physical manifestations of stress but with risk for potential vision loss we will do the following - urgent referral to general surgery for temporal artery biopsy - urgent referral to rheumatology for management -start prednisone 104m - with 1 month rx to start - also see diabetes and hypertension sections for management there"  Original plan from avs included home monitoring of BP, labwork and " please schedule a visit with me in 2-3 weeks. Bring your FMLA paperwork in with you and we will review this together. Talk to HR. I think taking a day off a week could help with stress reduction.   I think there is a high chance stress contributed to your symptoms but we want to be thorough and rule out other things. They did a great workup in ER. I also want one of your excellent local neurologists to evaluate you as well. Finally, call your eye doctor for follow  up to get his opinion on the double vision. "

## 2017-06-03 NOTE — Assessment & Plan Note (Addendum)
Increase metformin from 500mg  BID to 1000mg  BID as we start prednisone- she may need more rx. Will need to get her set up with a meter.

## 2017-06-03 NOTE — Assessment & Plan Note (Signed)
Blood pressure elevations yesterday thought to be contributing to her symptoms. She is on hctz and is supposed to be on metoprolol- she agrees to start the metoprolol today- especially with starting prednisone could raise BP- we have follow up to recheck in a few weeks

## 2017-06-06 ENCOUNTER — Other Ambulatory Visit: Payer: Self-pay

## 2017-06-06 DIAGNOSIS — Z9889 Other specified postprocedural states: Secondary | ICD-10-CM

## 2017-06-06 DIAGNOSIS — M316 Other giant cell arteritis: Secondary | ICD-10-CM

## 2017-06-07 DIAGNOSIS — M316 Other giant cell arteritis: Secondary | ICD-10-CM | POA: Diagnosis not present

## 2017-06-08 ENCOUNTER — Other Ambulatory Visit: Payer: Self-pay

## 2017-06-08 ENCOUNTER — Encounter (HOSPITAL_COMMUNITY): Payer: Self-pay | Admitting: *Deleted

## 2017-06-12 ENCOUNTER — Encounter (HOSPITAL_COMMUNITY): Payer: Self-pay | Admitting: Anesthesiology

## 2017-06-12 ENCOUNTER — Ambulatory Visit: Payer: Federal, State, Local not specified - PPO

## 2017-06-12 NOTE — Progress Notes (Signed)
Patient called to inform her surgery has been moved to 09:30 instead of 10:15 and asked to come in at 07:00 instead of 0745.   Patient states she is unable to arrive earlier than 07:45 as she will still be at work.

## 2017-06-12 NOTE — Anesthesia Preprocedure Evaluation (Addendum)
Anesthesia Evaluation  Patient identified by MRN, date of birth, ID band Patient awake    Reviewed: Allergy & Precautions, NPO status , Patient's Chart, lab work & pertinent test results, reviewed documented beta blocker date and time   Airway Mallampati: II  TM Distance: >3 FB Neck ROM: Full    Dental no notable dental hx. (+) Teeth Intact   Pulmonary neg pulmonary ROS,    Pulmonary exam normal breath sounds clear to auscultation       Cardiovascular hypertension, Pt. on medications and Pt. on home beta blockers + Peripheral Vascular Disease  Normal cardiovascular exam+ Valvular Problems/Murmurs  Rhythm:Regular Rate:Normal  Temporal arteritis syndrome   Neuro/Psych  Headaches, Anxiety    GI/Hepatic negative GI ROS, Neg liver ROS,   Endo/Other  diabetes, Well Controlled, Type 2Hypercholesterolemia - not no any Rx  Renal/GU   negative genitourinary   Musculoskeletal  (+) Arthritis , Osteoarthritis,    Abdominal   Peds  Hematology negative hematology ROS (+)   Anesthesia Other Findings   Reproductive/Obstetrics                           Anesthesia Physical Anesthesia Plan  ASA: II  Anesthesia Plan: General   Post-op Pain Management:    Induction:   PONV Risk Score and Plan: 3 and Ondansetron, Propofol infusion, Midazolam and Treatment may vary due to age or medical condition  Airway Management Planned: Natural Airway, Simple Face Mask and Nasal Cannula  Additional Equipment:   Intra-op Plan:   Post-operative Plan:   Informed Consent: I have reviewed the patients History and Physical, chart, labs and discussed the procedure including the risks, benefits and alternatives for the proposed anesthesia with the patient or authorized representative who has indicated his/her understanding and acceptance.   Dental advisory given  Plan Discussed with: CRNA, Surgeon and  Anesthesiologist  Anesthesia Plan Comments:        Anesthesia Quick Evaluation

## 2017-06-13 ENCOUNTER — Ambulatory Visit (HOSPITAL_COMMUNITY)
Admission: RE | Admit: 2017-06-13 | Discharge: 2017-06-13 | Disposition: A | Discharge status: Home / Self Care | Payer: Federal, State, Local not specified - PPO | Source: Ambulatory Visit | Attending: Surgery | Admitting: Surgery

## 2017-06-13 ENCOUNTER — Encounter (HOSPITAL_COMMUNITY)
Admission: RE | Disposition: A | Discharge status: Home / Self Care | Payer: Self-pay | Source: Ambulatory Visit | Attending: Surgery

## 2017-06-13 ENCOUNTER — Ambulatory Visit (HOSPITAL_COMMUNITY): Payer: Federal, State, Local not specified - PPO | Admitting: Anesthesiology

## 2017-06-13 ENCOUNTER — Encounter (HOSPITAL_COMMUNITY): Payer: Self-pay | Admitting: *Deleted

## 2017-06-13 DIAGNOSIS — J309 Allergic rhinitis, unspecified: Secondary | ICD-10-CM | POA: Diagnosis not present

## 2017-06-13 DIAGNOSIS — M316 Other giant cell arteritis: Secondary | ICD-10-CM | POA: Diagnosis not present

## 2017-06-13 DIAGNOSIS — E785 Hyperlipidemia, unspecified: Secondary | ICD-10-CM | POA: Diagnosis not present

## 2017-06-13 DIAGNOSIS — I10 Essential (primary) hypertension: Secondary | ICD-10-CM | POA: Diagnosis not present

## 2017-06-13 DIAGNOSIS — R51 Headache: Secondary | ICD-10-CM | POA: Insufficient documentation

## 2017-06-13 DIAGNOSIS — E119 Type 2 diabetes mellitus without complications: Secondary | ICD-10-CM | POA: Insufficient documentation

## 2017-06-13 DIAGNOSIS — Z79899 Other long term (current) drug therapy: Secondary | ICD-10-CM | POA: Diagnosis not present

## 2017-06-13 DIAGNOSIS — M1990 Unspecified osteoarthritis, unspecified site: Secondary | ICD-10-CM | POA: Diagnosis not present

## 2017-06-13 DIAGNOSIS — Z7984 Long term (current) use of oral hypoglycemic drugs: Secondary | ICD-10-CM | POA: Insufficient documentation

## 2017-06-13 HISTORY — PX: ARTERY BIOPSY: SHX891

## 2017-06-13 LAB — GLUCOSE, CAPILLARY
Glucose-Capillary: 97 mg/dL (ref 65–99)
Glucose-Capillary: 95 mg/dL (ref 65–99)

## 2017-06-13 SURGERY — BIOPSY TEMPORAL ARTERY
Anesthesia: General | Site: Face | Laterality: Left

## 2017-06-13 MED ORDER — PROPOFOL 10 MG/ML IV BOLUS
INTRAVENOUS | Status: DC | PRN
  Administered 2017-06-13 (×3): 10 mg via INTRAVENOUS
  Administered 2017-06-13: 20 mg via INTRAVENOUS

## 2017-06-13 MED ORDER — FENTANYL CITRATE (PF) 100 MCG/2ML IJ SOLN
25.0000 ug | INTRAMUSCULAR | Status: DC | PRN
Start: 2017-06-13 — End: 2017-06-13
  Administered 2017-06-13: 50 ug via INTRAVENOUS

## 2017-06-13 MED ORDER — FENTANYL CITRATE (PF) 100 MCG/2ML IJ SOLN
INTRAMUSCULAR | Status: AC
  Filled 2017-06-13: qty 2

## 2017-06-13 MED ORDER — 0.9 % SODIUM CHLORIDE (POUR BTL) OPTIME
TOPICAL | Status: DC | PRN
  Administered 2017-06-13: 1000 mL

## 2017-06-13 MED ORDER — ONDANSETRON HCL 4 MG/2ML IJ SOLN
INTRAMUSCULAR | Status: DC | PRN
  Administered 2017-06-13: 4 mg via INTRAVENOUS

## 2017-06-13 MED ORDER — LIDOCAINE 2% (20 MG/ML) 5 ML SYRINGE
INTRAMUSCULAR | Status: DC | PRN
  Administered 2017-06-13: 50 mg via INTRAVENOUS

## 2017-06-13 MED ORDER — LIDOCAINE HCL 1 % IJ SOLN
INTRAMUSCULAR | Status: DC | PRN
  Administered 2017-06-13: 8 mL

## 2017-06-13 MED ORDER — LACTATED RINGERS IV SOLN
INTRAVENOUS | Status: DC | PRN
  Administered 2017-06-13: 08:00:00 via INTRAVENOUS

## 2017-06-13 MED ORDER — ONDANSETRON HCL 4 MG/2ML IJ SOLN
INTRAMUSCULAR | Status: AC
  Filled 2017-06-13: qty 2

## 2017-06-13 MED ORDER — MIDAZOLAM HCL 2 MG/2ML IJ SOLN
INTRAMUSCULAR | Status: AC
  Filled 2017-06-13: qty 2

## 2017-06-13 MED ORDER — SODIUM BICARBONATE 4 % IV SOLN
INTRAVENOUS | Status: DC | PRN
  Administered 2017-06-13: 2 mL

## 2017-06-13 MED ORDER — FENTANYL CITRATE (PF) 100 MCG/2ML IJ SOLN
INTRAMUSCULAR | Status: DC | PRN
  Administered 2017-06-13: 50 ug via INTRAVENOUS

## 2017-06-13 MED ORDER — ROCURONIUM BROMIDE 10 MG/ML (PF) SYRINGE
PREFILLED_SYRINGE | INTRAVENOUS | Status: AC
  Filled 2017-06-13: qty 5

## 2017-06-13 MED ORDER — MEPERIDINE HCL 50 MG/ML IJ SOLN: 6.2500 mg | INTRAMUSCULAR | Status: DC | PRN

## 2017-06-13 MED ORDER — PROPOFOL 10 MG/ML IV BOLUS
INTRAVENOUS | Status: AC
  Filled 2017-06-13: qty 40

## 2017-06-13 MED ORDER — SODIUM BICARBONATE 4 % IV SOLN
INTRAVENOUS | Status: AC
  Filled 2017-06-13: qty 5

## 2017-06-13 MED ORDER — PROPOFOL 10 MG/ML IV BOLUS
INTRAVENOUS | Status: AC
  Filled 2017-06-13: qty 20

## 2017-06-13 MED ORDER — MIDAZOLAM HCL 2 MG/2ML IJ SOLN
INTRAMUSCULAR | Status: DC | PRN
  Administered 2017-06-13 (×2): 1 mg via INTRAVENOUS

## 2017-06-13 MED ORDER — SUGAMMADEX SODIUM 200 MG/2ML IV SOLN
INTRAVENOUS | Status: AC
  Filled 2017-06-13: qty 2

## 2017-06-13 MED ORDER — PROPOFOL 500 MG/50ML IV EMUL
INTRAVENOUS | Status: DC | PRN
  Administered 2017-06-13: 55 ug/kg/min via INTRAVENOUS

## 2017-06-13 MED ORDER — METOCLOPRAMIDE HCL 5 MG/ML IJ SOLN: 10.0000 mg | Freq: Once | INTRAMUSCULAR | Status: DC | PRN

## 2017-06-13 MED ORDER — LIDOCAINE HCL (PF) 1 % IJ SOLN
INTRAMUSCULAR | Status: AC
  Filled 2017-06-13: qty 30

## 2017-06-13 MED ORDER — CEFAZOLIN SODIUM-DEXTROSE 2-4 GM/100ML-% IV SOLN
INTRAVENOUS | Status: AC
  Filled 2017-06-13: qty 100

## 2017-06-13 MED ORDER — HYDROCODONE-ACETAMINOPHEN 7.5-325 MG PO TABS: 1.0000 | ORAL_TABLET | Freq: Once | ORAL | Status: DC | PRN

## 2017-06-13 MED ORDER — HYDROCODONE-ACETAMINOPHEN 5-325 MG PO TABS: 1.0000 | ORAL_TABLET | Freq: Four times a day (QID) | ORAL | 0 refills | Status: DC | PRN

## 2017-06-13 MED ORDER — CEFAZOLIN SODIUM-DEXTROSE 2-4 GM/100ML-% IV SOLN
2.0000 g | Freq: Once | INTRAVENOUS | Status: AC
  Administered 2017-06-13: 2 g via INTRAVENOUS

## 2017-06-13 MED ORDER — LIDOCAINE 2% (20 MG/ML) 5 ML SYRINGE
INTRAMUSCULAR | Status: AC
  Filled 2017-06-13: qty 5

## 2017-06-13 MED ORDER — DEXAMETHASONE SODIUM PHOSPHATE 10 MG/ML IJ SOLN
INTRAMUSCULAR | Status: DC | PRN
  Administered 2017-06-13: 10 mg via INTRAVENOUS

## 2017-06-13 MED ORDER — DEXAMETHASONE SODIUM PHOSPHATE 10 MG/ML IJ SOLN
INTRAMUSCULAR | Status: AC
  Filled 2017-06-13: qty 1

## 2017-06-13 SURGICAL SUPPLY — 32 items
BANDAGE ACE 6X5 VEL STRL LF (GAUZE/BANDAGES/DRESSINGS) IMPLANT
BLADE SURG 15 STRL LF DISP TIS (BLADE) ×1 IMPLANT
BLADE SURG 15 STRL SS (BLADE) ×1
CHLORAPREP W/TINT 26ML (MISCELLANEOUS) ×2 IMPLANT
COVER SURGICAL LIGHT HANDLE (MISCELLANEOUS) ×2 IMPLANT
DECANTER SPIKE VIAL GLASS SM (MISCELLANEOUS) ×2 IMPLANT
DERMABOND ADVANCED (GAUZE/BANDAGES/DRESSINGS) ×1
DERMABOND ADVANCED .7 DNX12 (GAUZE/BANDAGES/DRESSINGS) ×1 IMPLANT
DRAPE LAPAROTOMY TRNSV 102X78 (DRAPE) ×2 IMPLANT
DRSG TELFA 3X8 NADH (GAUZE/BANDAGES/DRESSINGS) ×2 IMPLANT
ELECT PENCIL ROCKER SW 15FT (MISCELLANEOUS) ×2 IMPLANT
ELECT REM PT RETURN 15FT ADLT (MISCELLANEOUS) ×2 IMPLANT
GAUZE SPONGE 4X4 12PLY STRL (GAUZE/BANDAGES/DRESSINGS) ×2 IMPLANT
GLOVE BIO SURGEON STRL SZ 6 (GLOVE) ×2 IMPLANT
GLOVE INDICATOR 6.5 STRL GRN (GLOVE) ×2 IMPLANT
GOWN STRL REUS W/TWL 2XL LVL3 (GOWN DISPOSABLE) ×2 IMPLANT
GOWN STRL REUS W/TWL XL LVL3 (GOWN DISPOSABLE) ×2 IMPLANT
KIT BASIN OR (CUSTOM PROCEDURE TRAY) ×2 IMPLANT
NEEDLE HYPO 25X1 1.5 SAFETY (NEEDLE) ×2 IMPLANT
PACK BASIC VI WITH GOWN DISP (CUSTOM PROCEDURE TRAY) ×2 IMPLANT
SPONGE LAP 4X18 X RAY DECT (DISPOSABLE) ×2 IMPLANT
STRIP CLOSURE SKIN 1/2X4 (GAUZE/BANDAGES/DRESSINGS) IMPLANT
SUT MNCRL AB 4-0 PS2 18 (SUTURE) ×2 IMPLANT
SUT SILK 4-0 12X30IN (SUTURE) ×2 IMPLANT
SUT VIC AB 3-0 SH 27 (SUTURE)
SUT VIC AB 3-0 SH 27X BRD (SUTURE) IMPLANT
SUT VIC AB 3-0 SH 27XBRD (SUTURE) IMPLANT
SUT VIC AB 4-0 PS2 27 (SUTURE) IMPLANT
SYR CONTROL 10ML LL (SYRINGE) ×2 IMPLANT
TOWEL OR 17X26 10 PK STRL BLUE (TOWEL DISPOSABLE) ×2 IMPLANT
TOWEL OR NON WOVEN STRL DISP B (DISPOSABLE) ×2 IMPLANT
YANKAUER SUCT BULB TIP 10FT TU (MISCELLANEOUS) ×2 IMPLANT

## 2017-06-13 NOTE — Anesthesia Procedure Notes (Signed)
Procedure Name: MAC Date/Time: 06/13/2017 10:04 AM Performed by: Dione Booze, CRNA Pre-anesthesia Checklist: Patient identified, Emergency Drugs available, Suction available and Patient being monitored Oxygen Delivery Method: Simple face mask Placement Confirmation: positive ETCO2

## 2017-06-13 NOTE — H&P (Signed)
Chief Complaint:  Headache and left side of head pain  History of Present Illness:  Dana Mcmahon is an 62 y.o. female who used to work at [employer] and who recently was begun evaluation in the ER for headache.  She had a Skype neurologic exam and subsequently was referred to me for left temporal artery biopsy.     Patient seen in the ER on 05/31/17. She does not normally get prolonged headaches (occasional short lived sharp pains) but she had a headache few days prior- worsened in AM of ER trip. She states all over left side of head- points at temple area, behind this and into forehead. Around 3 pm she felt weak in both of her arms and into neck/shoulders with some associated pain- bilateral ut started on right side. No recent trauma/injury. No recent massage or manipulation. She also had 10 minutes of double vision that then resolved. She reported to ED physicians that she had not had that before but today she tells me sometimes if looks at computer screen for a while will happen for a few seconds but nothing this prolonged in the past. Eye doctor not aware of double vision issues. She had no lower extremity symptoms. Symptoms seemed to wax and wane in intensity. --this paragraph was copied from PCP note.   Informed consent regarding temporal artery biopsy was obtained from her and her husband in my [place] office.    Past Medical History:  Diagnosis Date  . Allergic rhinitis   . Anxiety   . Arthritis   . DM type 2 (diabetes mellitus, type 2) ([place])   . Heart murmur   . Hyperlipidemia    per old records, pt has refused cholesto-lowering meds  . Hypertension   . Neck pain, chronic    Mild DDD, no signif progression (multiple MRI's: 2001, 2003, 2005, 2007)  . Obesity, Class I, BMI 30-34.9   . 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 COLONOSCOPY    . none      No current facility-administered medications for this encounter.    Current  Outpatient Medications  Medication Sig Dispense Refill  . Ascorbic Acid (VITAMIN C PO) Take 1 tablet by mouth daily.    Marland Kitchen BIOTIN PO Take 1 tablet by mouth daily.    . Cholecalciferol (VITAMIN D3 PO) Take 1 capsule by mouth daily.    . diphenhydrAMINE HCl, Sleep, (UNISOM SLEEPGELS) 50 MG CAPS Take 50 mg by mouth at bedtime.     . fluticasone (FLONASE) 50 MCG/ACT nasal spray Place 2 sprays into both nostrils daily. (Patient taking differently: Place 2 sprays into both nostrils daily as needed for allergies. ) 16 g 5  . hydrochlorothiazide (HYDRODIURIL) 25 MG tablet Take 12.5 mg by mouth daily.    . metFORMIN (GLUCOPHAGE) 1000 MG tablet Take 1 tablet (1,000 mg total) by mouth 2 (two) times daily with a meal. (Patient taking differently: Take 500 mg by mouth 2 (two) times daily with a meal. ) 180 tablet 3  . metoprolol succinate (TOPROL XL) 25 MG 24 hr tablet Take 0.5 tablets (12.5 mg total) by mouth daily. 45 tablet 3  . Multiple Vitamin (MULTIVITAMIN WITH MINERALS) TABS tablet Take 1 tablet by mouth daily.    Marland Kitchen POTASSIUM PO Take 1 tablet by mouth daily.    . predniSONE (DELTASONE) 50 MG tablet Take 1 tablet (50 mg total) by mouth daily with breakfast. 30 tablet  0  . Tetrahydrozoline HCl (VISINE OP) Place 1 drop into both eyes daily as needed (for dry eyes).     Codeine; Penicillins; and Latex Family History  Problem Relation Age of Onset  . Stroke Mother        46s, and father 67s  . Hypertension Mother        and father  . Stroke Brother        in 5s  . Colon cancer Neg Hx   . Esophageal cancer Neg Hx   . Rectal cancer Neg Hx   . Stomach cancer Neg Hx    Social History:   reports that she has never smoked. She has never used smokeless tobacco. She reports that she does not drink alcohol or use drugs.   REVIEW OF SYSTEMS : Negative except for see problem list  Physical Exam:   There were no vitals taken for this visit. There is no height or weight on file to calculate BMI.  Gen:   WDWN female NAD  Neurological: Alert and oriented to person, place, and time. Motor and sensory function is grossly intact  Head: Normocephalic but pain in left side of head.  Eyes: Conjunctivae are normal. Pupils are equal, round, and reactive to light. No scleral icterus.  Neck: Normal range of motion. Neck supple. No tracheal deviation or thyromegaly present.  Cardiovascular:  SR without murmurs or gallops.  No carotid bruits Breast:  Not examined Respiratory: Effort normal.  No respiratory distress. No chest wall tenderness. Breath sounds normal.  No wheezes, rales or rhonchi.  Abdomen:  nontender GU:  Not examined Musculoskeletal: Normal range of motion. Extremities are nontender. No cyanosis, edema or clubbing noted Lymphadenopathy: No cervical, preauricular, postauricular or axillary adenopathy is present Skin: Skin is warm and dry. No rash noted. No diaphoresis. No erythema. No pallor. Pscyh: Normal mood and affect. Behavior is normal. Judgment and thought content normal.   LABORATORY RESULTS: No results found for this or any previous visit (from the past 48 hour(s)).   RADIOLOGY RESULTS: No results found.  Problem List: Patient Active Problem List   Diagnosis Date Noted  . Temporal arteritis syndrome ([place]) 06/03/2017  . History of adenomatous polyp of colon 07/25/2016  . Upper airway cough syndrome 01/22/2016  . Osteoarthritis, hand 12/16/2014  . Allergic rhinitis 07/24/2014  . Hyperlipidemia   . Obesity, Class I, BMI 30-34.9   . Dizzy spells 08/08/2013  . Diabetes mellitus type II, controlled ([place]) 05/10/2013  . HTN (hypertension), benign 05/10/2013  . Palpitations 05/10/2013    Assessment & Plan: Left head pain for temporal artery biopsy.      Matt B. Hassell Done, MD, [facility] [phone] beeper [phone]  06/13/2017 6:35 AM

## 2017-06-13 NOTE — Op Note (Signed)
NYLIA GAVINA  01-Jan-1956 June 13, 2017   PCP:  Marin Olp, MD   Surgeon: Kaylyn Lim, MD, FACS  Asst:  none  Anes:  MAC with 1% lidocaine and Neut  Preop Dx: Rule out temporal arteritis Postop Dx: Same; path pending  Procedure: Left temporal artery biopsy Location Surgery: [facility] 4 Complications: none  EBL:   minimal cc  Drains: none  Description of Procedure:  The patient was taken to OR 4 .  After anesthesia was administered and the patient was prepped a timeout was performed.  The left temporal area was prepped and selected because of the pain distribution.  This is been marked and with her lying on her left side with anesthesia local procedure..  Palpate the artery infiltrated with lidocaine and sodium bicarbonate.  Small linear incision was made over the artery trace did not only with palpable pulse but also with the Doppler.  This was carried down to the subcu and into the pre vascular fascia was opened and this revealed the artery and its pulse was verified with the Doppler.  The vessel was isolated inferiorly and superiorly 4-0 silk ties were placed in the low port was placed over the vessel with obliteration of the proximal to the pulse was noted to go away.  These ties were then tied down and a branch vessel was ligated separately all 4-0 silk.  The completion Doppler was applied and this obliterated the signal that was heard before.  Following irrigation of the wound was closed with 4-0 Vicryl subcutaneous Dermabond.  The patient tolerated the procedure well and was taken to the PACU in stable condition.     Matt B. Hassell Done, [place], [facility], [place]

## 2017-06-13 NOTE — Anesthesia Postprocedure Evaluation (Signed)
Anesthesia Post Note  Patient: Dana Mcmahon  Procedure(s) Performed: LEFT TEMPORAL ARTERY BIOPSY (Left Face)     Patient location during evaluation: PACU Anesthesia Type: MAC Level of consciousness: awake and alert and oriented Pain management: pain level controlled Vital Signs Assessment: post-procedure vital signs reviewed and stable Respiratory status: spontaneous breathing, respiratory function stable and nonlabored ventilation Cardiovascular status: blood pressure returned to baseline and stable Postop Assessment: no apparent nausea or vomiting Anesthetic complications: no    Last Vitals:  Vitals:   06/13/17 1115 06/13/17 1130  BP: (!) 185/95 (!) 183/95  Pulse: 69 69  Resp: 14 11  Temp:    SpO2: 99% 93%    Last Pain:  Vitals:   06/13/17 1130  TempSrc:   PainSc: 3                  Srishti Strnad A.

## 2017-06-13 NOTE — Transfer of Care (Signed)
Immediate Anesthesia Transfer of Care Note  Patient: Dana Mcmahon  Procedure(s) Performed: LEFT TEMPORAL ARTERY BIOPSY (Left Face)  Patient Location: PACU  Anesthesia Type:MAC  Level of Consciousness: awake, sedated, drowsy and patient cooperative  Airway & Oxygen Therapy: Patient Spontanous Breathing and Patient connected to face mask oxygen  Post-op Assessment: Report given to RN and Post -op Vital signs reviewed and stable  Post vital signs: Reviewed and stable  Last Vitals:  Vitals Value Taken Time  BP 171/92 06/13/2017 11:03 AM  Temp    Pulse 55 06/13/2017 11:05 AM  Resp 15 06/13/2017 11:05 AM  SpO2 100 % 06/13/2017 11:05 AM  Vitals shown include unvalidated device data.  Last Pain:  Vitals:   06/13/17 0823  TempSrc:   PainSc: 0-No pain         Complications: No apparent anesthesia complications

## 2017-06-13 NOTE — Interval H&P Note (Signed)
History and Physical Interval Note:  06/13/2017 9:46 AM  Dana Mcmahon  has presented today for surgery, with the diagnosis of RULE OUT TEMPORAL ARTERITIS  The various methods of treatment have been discussed with the patient and family. After consideration of risks, benefits and other options for treatment, the patient has consented to  Procedure(s): LEFT TEMPORAL ARTERY BIOPSY (Left) as a surgical intervention .  The patient's history has been reviewed, patient examined, no change in status, stable for surgery.  I have reviewed the patient's chart and labs.  Questions were answered to the patient's satisfaction.     [name]

## 2017-06-13 NOTE — Discharge Instructions (Signed)
°  Post Anesthesia Home Care Instructions  Activity: Get plenty of rest for the remainder of the day. A responsible individual must stay with you for 24 hours following the procedure.  For the next 24 hours, DO NOT: -Drive a car -Paediatric nurse -Drink alcoholic beverages -Take any medication unless instructed by your physician -Make any legal decisions or sign important papers.  Meals: Start with liquid foods such as gelatin or soup. Progress to regular foods as tolerated. Avoid greasy, spicy, heavy foods. If nausea and/or vomiting occur, drink only clear liquids until the nausea and/or vomiting subsides. Call your physician if vomiting continues.  Special Instructions/Symptoms: Your throat may feel dry or sore from the anesthesia or the breathing tube placed in your throat during surgery. If this causes discomfort, gargle with warm salt water. The discomfort should disappear within 24 hours.  If you had a scopolamine patch placed behind your ear for the management of post- operative nausea and/or vomiting:  1. The medication in the patch is effective for 72 hours, after which it should be removed.  Wrap patch in a tissue and discard in the trash. Wash hands thoroughly with soap and water. 2. You may remove the patch earlier than 72 hours if you experience unpleasant side effects which may include dry mouth, dizziness or visual disturbances. 3. Avoid touching the patch. Wash your hands with soap and water after contact with the patch.    Tissue Adhesive Wound Care Some cuts and wounds can be closed with skin glue (tissue adhesive). Skin glue holds the skin together and helps your wound heal faster. Skin glue goes away on its own as your wound gets better. Follow these instructions at home: Wound care   Showers are allowed 24 hours after treatment. Do not soak the wound in water. Do not take baths, swim, or use hot tubs. Do not use soaps or creams on your wound.  If a bandage  (dressing) was put on the wound: ? Wash your hands with soap and water before you change your bandage. ? Change the bandage as often as told by your doctor. ? Leave skin glue in place. It will fall off on its own after 7-10 days. ? Keep the bandage dry.  Do not scratch, rub, or pick at the skin glue.  Do not put tape over the skin glue. The skin glue could come off when you take the tape off.  Protect the wound from another injury.  Protect the wound from sun and tanning beds. General instructions  Take over-the-counter and prescription medicines only as told by your doctor.  Keep all follow-up visits as told by your doctor. This is important. Get help right away if:  Your wound is red, puffy (swollen), hot, or tender.  You get a rash after the glue is put on.  You have more pain in the wound.  You have a red streak going away from the wound.  You have yellowish-white fluid (pus) coming from the wound.  You have more bleeding.  You have a fever.  You have chills and you start to shake.  You notice a bad smell coming from the wound.  Your wound or skin glue breaks open. This information is not intended to replace advice given to you by your health care provider. Make sure you discuss any questions you have with your health care provider. Document Released: 11/17/2007 Document Revised: 01/01/2016 Document Reviewed: 01/01/2016 Elsevier Interactive Patient Education  2017 [employer].

## 2017-06-14 ENCOUNTER — Encounter (HOSPITAL_COMMUNITY): Payer: Self-pay | Admitting: Surgery

## 2017-06-14 NOTE — Progress Notes (Signed)
The biopsy does not show temporal arteritis which is great news.  I am thrilled they were able to get her in this quickly.  Dana Mcmahon and Lanny Hurst- I need an update on her rheumatology referral.  If we can get her in soon I would still like their opinion before stopping the prednisone.  If we cannot get her in for months-I would like to sit down and discuss with her tapering off of the prednisone

## 2017-06-15 ENCOUNTER — Ambulatory Visit: Payer: Federal, State, Local not specified - PPO

## 2017-06-16 ENCOUNTER — Ambulatory Visit (INDEPENDENT_AMBULATORY_CARE_PROVIDER_SITE_OTHER): Payer: Federal, State, Local not specified - PPO

## 2017-06-16 ENCOUNTER — Ambulatory Visit: Payer: Federal, State, Local not specified - PPO | Admitting: Family Medicine

## 2017-06-16 ENCOUNTER — Other Ambulatory Visit: Payer: Self-pay

## 2017-06-16 DIAGNOSIS — E119 Type 2 diabetes mellitus without complications: Secondary | ICD-10-CM

## 2017-06-16 MED ORDER — GLUCOSE BLOOD VI STRP: ORAL_STRIP | 11 refills | Status: AC

## 2017-06-16 MED ORDER — ACCU-CHEK MULTICLIX LANCETS MISC: 11 refills | Status: AC

## 2017-06-19 NOTE — Progress Notes (Addendum)
Per Dr. Ansel Bong office note dated 06/02/2017, patient was given diabetic meter and instructed how to properly use it along with lancets and test strips in order to check her blood sugar.    Patient was given the opportunity to ask questions.  Any and all questions were answered to the patient's satisfaction.    Patient instructed to contact the office if she is to have any questions about the use of her meter and supplies in the future.  Patient verbalized understanding.

## 2017-06-20 ENCOUNTER — Encounter: Payer: Self-pay | Admitting: Neurology

## 2017-06-27 ENCOUNTER — Encounter: Payer: Self-pay | Admitting: Family Medicine

## 2017-06-27 ENCOUNTER — Ambulatory Visit: Payer: Federal, State, Local not specified - PPO | Admitting: Family Medicine

## 2017-06-27 VITALS — BP 110/78 | HR 77 | Temp 98.2°F | Ht 64.0 in | Wt 163.2 lb

## 2017-06-27 DIAGNOSIS — E119 Type 2 diabetes mellitus without complications: Secondary | ICD-10-CM | POA: Diagnosis not present

## 2017-06-27 DIAGNOSIS — M316 Other giant cell arteritis: Secondary | ICD-10-CM | POA: Diagnosis not present

## 2017-06-27 MED ORDER — PREDNISONE 5 MG PO TABS: ORAL_TABLET | ORAL | 0 refills | Status: AC

## 2017-06-27 NOTE — Assessment & Plan Note (Signed)
S: poorly controlled on last check so we increased metformin to 1000mg  BID from 500mg  BID particularly with prednisone use CBGs: on the 5th was 133 after a meal- she has run out lancets -she will go pick up more Lab Results  Component Value Date   HGBA1C 7.3 (H) 04/10/2017   HGBA1C 6.9 01/02/2017   HGBA1C 7.2 (H) 09/06/2016   A/P: hopefully no significant jump of a1c on prednisone- we will recheck at next visit in 1 month.  With her weight loss and where CBGs have been-I do not strongly suspect A1c over 8.  Perhaps off prednisone we can go back to 500 mg twice daily metformin

## 2017-06-27 NOTE — Assessment & Plan Note (Addendum)
S: Patient had been seen in the emergency room on 05/31/2017 for diplopia, bilateral arm pain, bilateral arm weakness, headache and palpitations.  Her blood pressure was high with systolic over 110 initially. she had a CTA of the head and CT of the head which did not show acute intracranial abnormality.  Patient saw tele-neurology who did not recommend MRI as they thought stroke or TIA was highly unlikely.  ED team also advised admission hypertensive emergency which she declined .  Luckily blood pressure trended down significantly before discharge   We saw her 2 days later.  She was having continued headache on the left side which was completely new for her- along with her diplopia we did an ESR to rule out temporal arteritis.  She had an elevated ESR at 49 though normal CRP.  We did not strongly suspect temporal arteritis but thought that we had  To rule it out at that point with temporal artery biopsy.    She had also had some weakness and pain in the upper extremities.  Possible polymyalgia rheumatica though did not strongly suspect. Highest on differential was anxiety related to recent stressors which in past has raised her BP as well.   Luckily she had a temporal artery biopsy with general surgery and this came back negative for temporal arteritis.  We did an urgent referral to rheumatology before we had these results back-this referral was rejected by rheumatology.  We had placed her on prednisone 50 mg for 1 month and she returns today to discuss tapering off.  She surprisingly has actually lost 5 pounds since being on prednisone  Also with the atypical symptoms (headache, diplopia, bilateral arm pain and weakness) we had referred her to neurology.  She is scheduled to see neurology in August.  I do not strongly suspect stroke or TIA.  Since starting prednisone (persistent headache went away but notes very very mild dull pain intermittently)- appetite has been low and she has tried to eat better, has  felt run down, fatigued. When she gets upset or angry she has noted headaches. Sometimes will get slight heaviness in scalp on left side. Sees Dr. Hassell Done on 15th for follow up with [place].   A/P: Luckily temporal arteritis has been ruled out.  We are now left with prednisone 50 mg which we need to taper off.  She is right at 3 weeks and 3 to 4 days.  Doubt adrenal axis suppression but will still do taper just to be on the safe side though we will do it rapidly.  See after visit instructions and taper plan over the next 4 weeks.  We will see her back after that to reevaluate.  I still think it is reasonable to see neurology (headache, diplopia, bilateral arm pain and weakness).  We also think anxiety could play into the patient's symptoms and blood pressure elevation-we filled out FMLA paperwork.  1 of my main concerns is anxiety which has contributed to multiple physical symptoms in the past including palpitations.  She may intermittently need to take some time off for a "cool off" period from the stressors of work.  She is having some side effects from prednisone and I hope this quickly resolved as we reduce her dose  At next visit- likely change diagnosis from temporal arteritis syndrome once off prednisone

## 2017-06-27 NOTE — Progress Notes (Signed)
Subjective:  Dana Mcmahon is a 62 y.o. year old very pleasant female patient who presents for/with See problem oriented charting ROS-feels fatigued and run down on prednisone.  Has low appetite.  No chest pain or shortness of breath.  Only very mild headache at times near site of procedure  Past Medical History-  Patient Active Problem List   Diagnosis Date Noted  . Temporal arteritis syndrome ([place]) 06/03/2017    Priority: High  . Diabetes mellitus type II, controlled ([place]) 05/10/2013    Priority: High  . History of adenomatous polyp of colon 07/25/2016    Priority: Medium  . Hyperlipidemia     Priority: Medium  . HTN (hypertension), benign 05/10/2013    Priority: Medium  . Palpitations 05/10/2013    Priority: Medium  . Osteoarthritis, hand 12/16/2014    Priority: Low  . Allergic rhinitis 07/24/2014    Priority: Low  . Obesity, Class I, BMI 30-34.9     Priority: Low  . Dizzy spells 08/08/2013    Priority: Low  . Upper airway cough syndrome 01/22/2016    Medications- reviewed and updated Current Outpatient Medications  Medication Sig Dispense Refill  . Ascorbic Acid (VITAMIN C PO) Take 1 tablet by mouth daily.    Marland Kitchen BIOTIN PO Take 1 tablet by mouth daily.    . Cholecalciferol (VITAMIN D3 PO) Take 1 capsule by mouth daily.    . diphenhydrAMINE HCl, Sleep, (UNISOM SLEEPGELS) 50 MG CAPS Take 50 mg by mouth at bedtime.     Marland Kitchen glucose blood (ACCU-CHEK GUIDE) test strip Check blood sugar three times daily 100 each 11  . hydrochlorothiazide (HYDRODIURIL) 25 MG tablet Take 12.5 mg by mouth daily.    . Lancets (ACCU-CHEK MULTICLIX) lancets Check blood sugar three times daily 100 each 11  . metFORMIN (GLUCOPHAGE) 1000 MG tablet Take 1 tablet (1,000 mg total) by mouth 2 (two) times daily with a meal. 180 tablet 3  . metoprolol succinate (TOPROL XL) 25 MG 24 hr tablet Take 0.5 tablets (12.5 mg total) by mouth daily. 45 tablet 3  . Multiple Vitamin (MULTIVITAMIN WITH MINERALS) TABS tablet  Take 1 tablet by mouth daily.    Marland Kitchen POTASSIUM PO Take 1 tablet by mouth daily.    . predniSONE (DELTASONE) 50 MG tablet Take 1 tablet (50 mg total) by mouth daily with breakfast. 30 tablet 0  . Tetrahydrozoline HCl (VISINE OP) Place 1 drop into both eyes daily as needed (for dry eyes).    . fluticasone (FLONASE) 50 MCG/ACT nasal spray Place 2 sprays into both nostrils daily. (Patient not taking: Reported on 06/27/2017) 16 g 5  . [START ON 07/04/2017] predniSONE (DELTASONE) 5 MG tablet Take 3 tablets (15 mg total) by mouth daily with breakfast for 7 days, THEN 1 tablet (5 mg total) daily with breakfast for 7 days. 28 tablet 0   No current facility-administered medications for this visit.     Objective: BP 110/78 (BP Location: Left Arm, Patient Position: Sitting, Cuff Size: Normal)   Pulse 77   Temp 98.2 F (36.8 C) (Oral)   Ht '5\' 4"'  (1.626 m)   Wt 163 lb 3.2 oz (74 kg)   SpO2 97%   BMI 28.01 kg/m  Gen: NAD, resting comfortably CV: RRR , 2+ PT and PT pulses Lungs: Nonlabored normal respiratory rate Diabetic Foot Exam - Simple   Simple Foot Form Diabetic Foot exam was performed with the following findings:  Yes 06/27/2017 11:22 AM  Visual Inspection No deformities,  no ulcerations, no other skin breakdown bilaterally:  Yes Sensation Testing Intact to touch and monofilament testing bilaterally:  Yes Pulse Check Posterior Tibialis and Dorsalis pulse intact bilaterally:  Yes Comments    Assessment/Plan:  Diabetes mellitus type II, controlled ([place]) S: poorly controlled on last check so we increased metformin to 1035m BID from 5043mBID particularly with prednisone use CBGs: on the 5th was 133 after a meal- she has run out lancets -she will go pick up more Lab Results  Component Value Date   HGBA1C 7.3 (H) 04/10/2017   HGBA1C 6.9 01/02/2017   HGBA1C 7.2 (H) 09/06/2016   A/P: hopefully no significant jump of a1c on prednisone- we will recheck at next visit in 1 month.  With her weight  loss and where CBGs have been-I do not strongly suspect A1c over 8.  Perhaps off prednisone we can go back to 500 mg twice daily metformin  Temporal arteritis syndrome ([facility]) S: Patient had been seen in the emergency room on 05/31/2017 for diplopia, bilateral arm pain, bilateral arm weakness, headache and palpitations.  Her blood pressure was high with systolic over 20759nitially. she had a CTA of the head and CT of the head which did not show acute intracranial abnormality.  Patient saw tele-neurology who did not recommend MRI as they thought stroke or TIA was highly unlikely.  ED team also advised admission hypertensive emergency which she declined .  Luckily blood pressure trended down significantly before discharge   We saw her 2 days later.  She was having continued headache on the left side which was completely new for her- along with her diplopia we did an ESR to rule out temporal arteritis.  She had an elevated ESR at 49 though normal CRP.  We did not strongly suspect temporal arteritis but thought that we had  To rule it out at that point with temporal artery biopsy.    She had also had some weakness and pain in the upper extremities.  Possible polymyalgia rheumatica though did not strongly suspect. Highest on differential was anxiety related to recent stressors which in past has raised her BP as well.   Luckily she had a temporal artery biopsy with general surgery and this came back negative for temporal arteritis.  We did an urgent referral to rheumatology before we had these results back-this referral was rejected by rheumatology.  We had placed her on prednisone 50 mg for 1 month and she returns today to discuss tapering off.  She surprisingly has actually lost 5 pounds since being on prednisone  Also with the atypical symptoms (headache, diplopia, bilateral arm pain and weakness) we had referred her to neurology.  She is scheduled to see neurology in August.  I do not strongly suspect stroke or  TIA.  Since starting prednisone (persistent headache went away but notes very very mild dull pain intermittently)- appetite has been low and she has tried to eat better, has felt run down, fatigued. When she gets upset or angry she has noted headaches. Sometimes will get slight heaviness in scalp on left side. Sees Dr. MaHassell Donen 15th [place]  A/P: Luckily temporal arteritis has been ruled out.  We are now left with prednisone 50 mg which we need to taper off.  She is right at 3 weeks and 3 to 4 days.  Doubt adrenal axis suppression but will still do taper just to be on the safe side though we will do it  rapidly.  See after visit instructions and taper plan over the next 4 weeks.  We will see her back after that to reevaluate.  I still think it is reasonable to see neurology (headache, diplopia, bilateral arm pain and weakness).  We also think anxiety could play into the patient's symptoms and blood pressure elevation-we filled out FMLA paperwork.  1 of my main concerns is anxiety which has contributed to multiple physical symptoms in the past including palpitations.  She may intermittently need to take some time off for a "cool off" period from the stressors of work.  She is having some side effects from prednisone and I hope this quickly resolved as we reduce her dose  At next visit- likely change diagnosis from temporal arteritis syndrome once off prednisone   Future Appointments  Date Time Provider [place]  08/07/2017  4:20 PM Nahser, Wonda Cheng, MD [facility]  08/11/2017  3:30 PM Marin Olp, MD [facility]  09/21/2017  3:50 PM Pieter Partridge, DO [facility] None  need a1c at follow up visit  Meds ordered this encounter  Medications  . predniSONE (DELTASONE) 5 MG tablet    Sig: Take 3 tablets (15 mg total) by mouth daily with breakfast for 7 days, THEN 1 tablet (5 mg total) daily with breakfast for 7 days.    Dispense:  28 tablet     Refill:  0   Time Stamp The duration of face-to-face time during this visit was greater than 40 minutes (10:25 AM to 11:16 AM). Greater than 50% of this time was spent in counseling, explanation of diagnosis, planning of further management, and/or coordination of care including discussion of prednisone taper, discussion of recent stressors, reviewing and determining FMLA parameters.   Return precautions advised.  Garret Reddish, MD

## 2017-06-27 NOTE — Patient Instructions (Addendum)
Health Maintenance Due  Topic Date Due  . MAMMOGRAM - Just[place] will call [facility] at [phone] to schedule.  01/11/2017  . FOOT EXAM -  04/15/2017   Take half of the 50 mg prednisone daily for the next week  Then start new prescription of prednisone 5mg  tablets  If you have flu like symptoms pleas see [place] immediately

## 2017-07-10 ENCOUNTER — Telehealth (HOSPITAL_COMMUNITY): Payer: Self-pay | Admitting: Cardiovascular Disease

## 2017-07-10 ENCOUNTER — Telehealth: Payer: Self-pay | Admitting: Cardiovascular Disease

## 2017-07-10 DIAGNOSIS — R079 Chest pain, unspecified: Secondary | ICD-10-CM

## 2017-07-10 NOTE — Telephone Encounter (Signed)
Woke up at 0540 today with severe pain rated >10 per patient States similar pain occurred early part of March before she saw Dr. Acie Fredrickson and at the end of December States pain started upper right shoulder and back and radiated to her heart; denies elevated HR; sates this pain is different from palpitations States she had to beat her chest because it felt like her heart was not pumping blood and pain resolved. She states she has not started exercising again since being cleared by surgeon after temporal artery biopsy Went to ED in April with very high BP which led to left temporal artery biopsy and states she continues to have some headaches and has not been very active. She states she is somewhat fatigued and "winded." I advised her that I will review with Dr. Acie Fredrickson and call her back with his advice. She verbalized understanding and agreement and thanked me for the call.

## 2017-07-10 NOTE — Telephone Encounter (Signed)
New Message   Pt c/o of Chest Pain: STAT if CP now or developed within 24 hours  1. Are you having CP right now? no  2. Are you experiencing any other symptoms (ex. SOB, nausea, vomiting, sweating)? No, but she said that she did not feel well all weekend  3. How long have you been experiencing CP? Started about 5:45am this morning but is not having any pains at this time   4. Is your CP continuous or coming and going? It was continuous but has since stop.   5. Have you taken Nitroglycerin? No  ?

## 2017-07-10 NOTE — Telephone Encounter (Signed)
Called patient and reviewed Dr. Elmarie Shiley advice with her. She verbalized understanding and agreement and requests the stress test to be on Friday May 24 if possible. I advised that I will ask our schedulers to see what is available and call her.  She thanked me for the call.

## 2017-07-10 NOTE — Telephone Encounter (Signed)
Her symptoms were concerning but apparently now have resolved. If she is having any residual symptoms , she should go to the ER. If symptoms are improved, please schedule her for a [employer] [employer] or [place] )  If symptoms return, have her go to the ER .

## 2017-07-11 NOTE — Telephone Encounter (Signed)
User: Cherie Dark A Date/time: 07/10/17 3:40 PM  Comment: Called pt but she does not have a voicemail set up. Was unable to lmsg  Context:  Outcome: No Answer/Busy  Phone number: [phone] Phone Type: Home Phone  Comm. type: Telephone Call type: Outgoing  Contact: Felix Pacini Z Relation to patient: Self

## 2017-07-18 ENCOUNTER — Telehealth (HOSPITAL_COMMUNITY): Payer: Self-pay | Admitting: *Deleted

## 2017-07-18 NOTE — Telephone Encounter (Signed)
Patient given detailed instructions per Myocardial Perfusion Study Information Sheet for the test on 07/21/17 at 0800. Patient notified to arrive 15 minutes early and that it is imperative to arrive on time for appointment to keep from having the test rescheduled.  If you need to cancel or reschedule your appointment, please call the office within 24 hours of your appointment. . Patient verbalized understanding.Lizvet Chunn, [place]

## 2017-07-21 ENCOUNTER — Ambulatory Visit (HOSPITAL_COMMUNITY): Payer: Federal, State, Local not specified - PPO | Attending: Cardiovascular Disease

## 2017-07-21 ENCOUNTER — Telehealth: Payer: Self-pay | Admitting: Nurse Practitioner

## 2017-07-21 DIAGNOSIS — R079 Chest pain, unspecified: Secondary | ICD-10-CM | POA: Diagnosis not present

## 2017-07-21 DIAGNOSIS — R9439 Abnormal result of other cardiovascular function study: Secondary | ICD-10-CM | POA: Diagnosis not present

## 2017-07-21 DIAGNOSIS — E119 Type 2 diabetes mellitus without complications: Secondary | ICD-10-CM | POA: Diagnosis not present

## 2017-07-21 LAB — MYOCARDIAL PERFUSION IMAGING
CHL CUP NUCLEAR SRS: 5
CHL CUP NUCLEAR SSS: 9
CHL CUP RESTING HR STRESS: 72 {beats}/min
CSEPED: 8 min
CSEPEDS: 34 s
CSEPEW: 10.1 METS
CSEPHR: 91 %
CSEPPHR: 144 {beats}/min
LV dias vol: 76 mL (ref 46–106)
LV sys vol: 29 mL
MPHR: 158 {beats}/min
RATE: 0.36
SDS: 4
TID: 1.1

## 2017-07-21 MED ORDER — TECHNETIUM TC 99M TETROFOSMIN IV KIT
10.2000 | PACK | Freq: Once | INTRAVENOUS | Status: AC | PRN
  Administered 2017-07-21: 10.2 via INTRAVENOUS
  Filled 2017-07-21: qty 11

## 2017-07-21 MED ORDER — METOPROLOL SUCCINATE ER 25 MG PO TB24: 25.0000 mg | ORAL_TABLET | Freq: Every day | ORAL | 3 refills | Status: DC

## 2017-07-21 MED ORDER — TECHNETIUM TC 99M TETROFOSMIN IV KIT
30.3000 | PACK | Freq: Once | INTRAVENOUS | Status: AC | PRN
  Administered 2017-07-21: 30.3 via INTRAVENOUS
  Filled 2017-07-21: qty 31

## 2017-07-21 NOTE — Telephone Encounter (Signed)
Patient had exercise myoview today and the ECG was reviewed with Dr. Acie Fredrickson who advised patient increase Toprol XL to 25 mg daily due to the heart's response to exercise. I advised that Dr. Acie Fredrickson still needs to review the nuclear images and that we will call her back with the final results of the test, likely on Monday. She verbalized understanding and agreement with plan and thanked me for the call.

## 2017-07-24 NOTE — Telephone Encounter (Signed)
The myoview is low risk. Small area of attenuation that is most likely an artifact  We have increased the Toprol XL to 25 mg a day for rate related bundle branch block

## 2017-08-07 ENCOUNTER — Encounter: Payer: Self-pay | Admitting: Cardiovascular Disease

## 2017-08-07 ENCOUNTER — Ambulatory Visit (INDEPENDENT_AMBULATORY_CARE_PROVIDER_SITE_OTHER): Payer: Federal, State, Local not specified - PPO | Admitting: Cardiovascular Disease

## 2017-08-07 VITALS — BP 100/66 | HR 81 | Ht 64.0 in | Wt 165.6 lb

## 2017-08-07 DIAGNOSIS — I491 Atrial premature depolarization: Secondary | ICD-10-CM

## 2017-08-07 DIAGNOSIS — I493 Ventricular premature depolarization: Secondary | ICD-10-CM | POA: Diagnosis not present

## 2017-08-07 DIAGNOSIS — I10 Essential (primary) hypertension: Secondary | ICD-10-CM | POA: Diagnosis not present

## 2017-08-07 MED ORDER — METOPROLOL SUCCINATE ER 25 MG PO TB24: 12.5000 mg | ORAL_TABLET | Freq: Every day | ORAL | 3 refills | Status: DC

## 2017-08-07 NOTE — Progress Notes (Signed)
Cardiology Office Note:    Date:  08/07/2017   ID:  Dana Mcmahon, DOB 1955/05/27, MRN [id number]  PCP:  Dana Olp, MD  Cardiologist:  Dana Mcmahon   Problem List 1. Palpitations  2.  Diabetes mellitus 3.  Hyperlipidemia   Referring MD: Dana Olp, MD   Chief Complaint  Patient presents with  . Palpitations    History of Present Illness:    Dana Mcmahon is a 62 y.o. female with a hx of palpitations. Feels her Heart racing Associated with chest heaviness Last for several minutes, Different times of the day .   Has woken her up from sleep on occasion Exercises reguarly - walks on the treadmill - ,  No issues while working out, she occasionally symptoms right after working out.  These are associated with some mild lightheadedness.  Similar palpitations in 2016 - Holter showed PACs and PVCs   August 07, 2017:  Dana Mcmahon is seen today for follow up of her PVCs Tried toprol XL.  Has lots of fatigue.   No energy to do much  Not exercisig      Past Medical History:  Diagnosis Date  . Allergic rhinitis   . Anxiety   . Arthritis   . DM type 2 (diabetes mellitus, type 2) ([place])   . Heart murmur   . Hyperlipidemia    per old records, pt has refused cholesto-lowering meds  . Hypertension   . Neck pain, chronic    Mild DDD, no signif progression (multiple MRI's: 2001, 2003, 2005, 2007)  . Obesity, Class I, BMI 30-34.9   . 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 ARTERY BIOPSY Left 06/13/2017   Procedure: LEFT TEMPORAL ARTERY BIOPSY;  Surgeon: Johnathan Hausen, MD;  Location: [facility];  Service: General;  Laterality: Left;  . COLONOSCOPY    . none      Current Medications: Current Meds  Medication Sig  . Ascorbic Acid (VITAMIN C PO) Take 1 tablet by mouth daily.  Marland Kitchen BIOTIN PO Take 1 tablet by mouth daily.  . Cholecalciferol (VITAMIN D3 PO) Take 1 capsule by mouth daily.  . diphenhydrAMINE HCl, Sleep, (UNISOM  SLEEPGELS) 50 MG CAPS Take 50 mg by mouth at bedtime.   . fluticasone (FLONASE) 50 MCG/ACT nasal spray Place 2 sprays into both nostrils daily.  Marland Kitchen glucose blood (ACCU-CHEK GUIDE) test strip Check blood sugar three times daily  . hydrochlorothiazide (HYDRODIURIL) 25 MG tablet Take 12.5 mg by mouth daily.  . Lancets (ACCU-CHEK MULTICLIX) lancets Check blood sugar three times daily  . metFORMIN (GLUCOPHAGE) 1000 MG tablet Take 1 tablet (1,000 mg total) by mouth 2 (two) times daily with a meal.  . Multiple Vitamin (MULTIVITAMIN WITH MINERALS) TABS tablet Take 1 tablet by mouth daily.  Marland Kitchen POTASSIUM PO Take 95 mg by mouth daily.   . Tetrahydrozoline HCl (VISINE OP) Place 1 drop into both eyes daily as needed (for dry eyes).  . [DISCONTINUED] metoprolol succinate (TOPROL XL) 25 MG 24 hr tablet Take 1 tablet (25 mg total) by mouth daily.     Allergies:   Codeine; Penicillins; and Latex   Social History   Socioeconomic History  . Marital status: Married    Spouse name: Not on file  . Number of children: Not on file  . Years of education: Not on file  . Highest education level: Not on file  Occupational History  . Not  on file  Social Needs  . Financial resource strain: Not on file  . Food insecurity:    Worry: Not on file    Inability: Not on file  . Transportation needs:    Medical: Not on file    Non-medical: Not on file  Tobacco Use  . Smoking status: Never Smoker  . Smokeless tobacco: Never Used  Substance and Sexual Activity  . Alcohol use: No  . Drug use: No  . Sexual activity: Not on file  Lifestyle  . Physical activity:    Days per week: Not on file    Minutes per session: Not on file  . Stress: Not on file  Relationships  . Social connections:    Talks on phone: Not on file    Gets together: Not on file    Attends religious service: Not on file    Active member of club or organization: Not on file    Attends meetings of clubs or organizations: Not on file     Relationship status: Not on file  Other Topics Concern  . Not on file  Social History Narrative   Married, 4 children (daughter Charisma patient here, husband Patsy [place], son Patsy [place]). 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, now is an Oceanographer.      Hobbies: time with family, watching news, reading              Family History: The patient's family history includes Hypertension in her mother; Stroke in her brother and mother. There is no history of Colon cancer, Esophageal cancer, Rectal cancer, or Stomach cancer.  ROS:   Please see the history of present illness.     All other systems reviewed and are negative.  EKGs/Labs/Other Studies Reviewed:    The following studies were reviewed today:   EKG:  April 21, 2017:  NSR.  [employer] RBBB   Recent Labs: 05/31/2017: ALT 50; BUN 13; Creatinine, Ser 0.71; Hemoglobin 14.8; Platelets 304; Potassium 3.8; Sodium 130; TSH 3.202  Recent Lipid Panel    Component Value Date/Time   CHOL 248 (H) 09/11/2015 0859   TRIG 308.0 (H) 09/11/2015 0859   HDL 47.90 09/11/2015 0859   CHOLHDL 5 09/11/2015 0859   VLDL 61.6 (H) 09/11/2015 0859   LDLDIRECT 112.0 09/06/2016 1718    Physical Exam:    Physical Exam: Blood pressure 100/66, pulse 81, height 5\' 4"  (1.626 m), weight 165 lb 9.6 oz (75.1 kg), SpO2 93 %.  GEN:  Well nourished, well developed in no acute distress HEENT: Normal NECK: No JVD; No carotid bruits LYMPHATICS: No lymphadenopathy CARDIAC: RRR  RESPIRATORY:  Clear to auscultation without rales, wheezing or rhonchi  ABDOMEN: Soft, non-tender, non-distended MUSCULOSKELETAL:  No edema; No deformity  SKIN: Warm and dry NEUROLOGIC:  Alert and oriented x 3   ASSESSMENT:    No diagnosis found. PLAN:    In order of problems listed above:  1. Palpitations -Dana Mcmahon presents with palpitations.  She had a Holter monitor in 2016 that revealed premature ventricular contractions  and premature atrial contractions.  Her palpitations are unchanged.  She was started on Toprol-XL to 12.5 mg a day.  She had a stress test which revealed a rate related bundle branch block at peak exercise.  We increase the Toprol-XL to help suppress her heart rate.  She has not been tolerating that well at all.  We will reduce  the Toprol XL back to 12.5 mg a day.  I reassured her that her PACs and PVCs are benign.  Medication Adjustments/Labs and Tests Ordered: Current medicines are reviewed at length with the patient today.  Concerns regarding medicines are outlined above.  No orders of the defined types were placed in this encounter.  Meds ordered this encounter  Medications  . metoprolol succinate (TOPROL XL) 25 MG 24 hr tablet    Sig: Take 0.5 tablets (12.5 mg total) by mouth daily.    Dispense:  45 tablet    Refill:  3    Signed, Mertie Moores, MD  08/07/2017 5:22 PM    [facility]

## 2017-08-07 NOTE — Patient Instructions (Signed)
Medication Instructions:  Your physician has recommended you make the following change in your medication:   DECREASE Toprol XL to 12.5 mg once daily   Labwork: None Ordered   Testing/Procedures: None Ordered   Follow-Up: Your physician wants you to follow-up in: 6 months with Dr. Acie Fredrickson. You will receive a reminder letter in the mail two months in advance. If you don't receive a letter, please call our office to schedule the follow-up appointment.   If you need a refill on your cardiac medications before your next appointment, please call your pharmacy.   Thank you for choosing [facility]! Christen Bame, RN [phone]

## 2017-08-11 ENCOUNTER — Ambulatory Visit (INDEPENDENT_AMBULATORY_CARE_PROVIDER_SITE_OTHER): Payer: Federal, State, Local not specified - PPO | Admitting: Family Medicine

## 2017-08-11 ENCOUNTER — Encounter: Payer: Self-pay | Admitting: Family Medicine

## 2017-08-11 ENCOUNTER — Telehealth: Payer: Self-pay | Admitting: Family Medicine

## 2017-08-11 VITALS — BP 132/82 | HR 74 | Temp 97.8°F | Ht 64.0 in | Wt 166.8 lb

## 2017-08-11 DIAGNOSIS — R519 Headache, unspecified: Secondary | ICD-10-CM

## 2017-08-11 DIAGNOSIS — R002 Palpitations: Secondary | ICD-10-CM | POA: Diagnosis not present

## 2017-08-11 DIAGNOSIS — R51 Headache: Secondary | ICD-10-CM | POA: Diagnosis not present

## 2017-08-11 DIAGNOSIS — I10 Essential (primary) hypertension: Secondary | ICD-10-CM | POA: Diagnosis not present

## 2017-08-11 DIAGNOSIS — E119 Type 2 diabetes mellitus without complications: Secondary | ICD-10-CM

## 2017-08-11 MED ORDER — METFORMIN HCL 500 MG PO TABS
500.0000 mg | ORAL_TABLET | Freq: Two times a day (BID) | ORAL | 2 refills | Status: DC
Start: 2017-08-11 — End: 2018-05-03

## 2017-08-11 NOTE — Assessment & Plan Note (Signed)
S: prior left sided headache associated with blurry vision is now gone.   She does tell me that she gets Pain in top of scalp - several sharp pains for seconds and will disappear. Lingering diffuse headache as well on top of scalps. She reports these Headaches over last 2 years - but no history prior. Had CT angio head and neck when in ER 05/31/17- I have not felt strongly about additional imaging A/P: she wants to keep appointment with neurology to discuss these headaches over last 2 years. Patient tends to get a lot of reassurance from specialists- as evidenced by recent need for cardiology referral for her PVCs- I think this visit will be meaningful for her from a comfort perspective- she tends to have high anxiety about health related issues

## 2017-08-11 NOTE — Assessment & Plan Note (Signed)
S: poorly controlled on last check on metformin 1000mg  BID. She started with diarrhea with this daily. Had been on prednisone and now off- likely raised sugars some- she is asking about going back to 500mg  BID Lab Results  Component Value Date   HGBA1C 7.3 (H) 04/10/2017   HGBA1C 6.9 01/02/2017   HGBA1C 7.2 (H) 09/06/2016   A/P: update a1c. Go down to metformin 500mg  BID. If a1c is above 7 this time and next would likely add januvia 100mg . She wants a chance to improve her exercise and health eating before increasing meds

## 2017-08-11 NOTE — Telephone Encounter (Signed)
Copied from [place] [phone]. Topic: Referral - Status >> Aug 10, 2017 11:39 AM Mylinda Latina, NT wrote: Reason for CRM:  Amega calling from [facility] states she got a fax on a patient to check to see if she had an upcoming appt . Amega states the patient is not in their system. Unsure if she received that referral.  Please fax again Fax# [phone]     Referral will be re-sent to [place] or [facility]. Will update once completed.

## 2017-08-11 NOTE — Assessment & Plan Note (Addendum)
S: controlled on hctz 25mg  daily,  metoprolol 12.5mg  BID for palpitations BP Readings from Last 3 Encounters:  08/11/17 132/82  08/07/17 100/66  06/27/17 110/78  A/P: blood pressure goal of <140/90. Continue current meds

## 2017-08-11 NOTE — Assessment & Plan Note (Signed)
Palpitations due to anxiety and PVCs- Patient has done better on beta blocker. Prior trials of counseling or meds in regards to anxiety less helpful.

## 2017-08-11 NOTE — Patient Instructions (Addendum)
Health Maintenance Due  Topic Date Due  . MAMMOGRAM -Patient will call and schedule an appointment once things calm down with other doctors appointment. 01/11/2017   Please check with your pharmacy to see if they have the shingrix vaccine. If they do- please get this immunization and update [place] by phone call or mychart with dates you receive the vaccine  See me in 3 months and 1 day.   a1c goal of 7- that would mean your average blood sugar over 3 months was 150 or so. We want your fasting sugars to be between 80 and 120. After meals hopefully under 150 or 160 though definitely under 180.   I would avoid all sodas until a1c under 7. I would exercise at least 5 days a week for 30 minutes- otherwise we may need to add other diabetes medicines  Please stop by lab before you go

## 2017-08-11 NOTE — Progress Notes (Signed)
Subjective:  Dana Mcmahon is a 62 y.o. year old very pleasant female patient who presents for/with See problem oriented charting ROS- palpitations better. No reported chest pain. No shortness of breath. No increased edema   Past Medical History-  Patient Active Problem List   Diagnosis Date Noted  . Headache, unspecified headache type 06/03/2017    Priority: High  . Diabetes mellitus type II, controlled ([place]) 05/10/2013    Priority: High  . History of adenomatous polyp of colon 07/25/2016    Priority: Medium  . Hyperlipidemia     Priority: Medium  . HTN (hypertension), benign 05/10/2013    Priority: Medium  . Palpitations 05/10/2013    Priority: Medium  . Osteoarthritis, hand 12/16/2014    Priority: Low  . Allergic rhinitis 07/24/2014    Priority: Low  . Obesity, Class I, BMI 30-34.9     Priority: Low  . Dizzy spells 08/08/2013    Priority: Low  . Upper airway cough syndrome 01/22/2016    Medications- reviewed and updated Current Outpatient Medications  Medication Sig Dispense Refill  . Ascorbic Acid (VITAMIN C PO) Take 1 tablet by mouth daily.    Marland Kitchen BIOTIN PO Take 1 tablet by mouth daily.    . Cholecalciferol (VITAMIN D3 PO) Take 1 capsule by mouth daily.    . diphenhydrAMINE HCl, Sleep, (UNISOM SLEEPGELS) 50 MG CAPS Take 50 mg by mouth at bedtime.     . fluticasone (FLONASE) 50 MCG/ACT nasal spray Place 2 sprays into both nostrils daily. 16 g 5  . glucose blood (ACCU-CHEK GUIDE) test strip Check blood sugar three times daily 100 each 11  . hydrochlorothiazide (HYDRODIURIL) 25 MG tablet Take 12.5 mg by mouth daily.    . Lancets (ACCU-CHEK MULTICLIX) lancets Check blood sugar three times daily 100 each 11  . metFORMIN (GLUCOPHAGE) 1000 MG tablet Take 1 tablet (1,000 mg total) by mouth 2 (two) times daily with a meal. 180 tablet 3  . metoprolol succinate (TOPROL XL) 25 MG 24 hr tablet Take 0.5 tablets (12.5 mg total) by mouth daily. 45 tablet 3  . Multiple Vitamin  (MULTIVITAMIN WITH MINERALS) TABS tablet Take 1 tablet by mouth daily.    Marland Kitchen POTASSIUM PO Take 95 mg by mouth daily.     . Tetrahydrozoline HCl (VISINE OP) Place 1 drop into both eyes daily as needed (for dry eyes).     Objective: BP 132/82 (BP Location: Left Arm, Patient Position: Sitting, Cuff Size: Normal)   Pulse 74   Temp 97.8 F (36.6 C) (Oral)   Ht 5\' 4"  (1.626 m)   Wt 166 lb 12.8 oz (75.7 kg)   SpO2 95%   BMI 28.63 kg/m  Gen: NAD, resting comfortably CV: RRR no murmurs rubs or gallops Lungs: CTAB no crackles, wheeze, rhonchi Abdomen: soft/nontender/nondistended/normal bowel sounds. No rebound or guarding.  Ext: trace edema Skin: warm, dry Neuro: grossly normal, moves all extremities  Assessment/Plan:  Diabetes mellitus type II, controlled ([place]) S: poorly controlled on last check on metformin 1000mg  BID. She started with diarrhea with this daily. Had been on prednisone and now off- likely raised sugars some- she is asking about going back to 500mg  BID Lab Results  Component Value Date   HGBA1C 7.3 (H) 04/10/2017   HGBA1C 6.9 01/02/2017   HGBA1C 7.2 (H) 09/06/2016   A/P: update a1c. Go down to metformin 500mg  BID. If a1c is above 7 this time and next would likely add januvia 100mg . She wants a chance to  improve her exercise and health eating before increasing meds  HTN (hypertension), benign S: controlled on hctz 25mg  daily,  metoprolol 12.5mg  BID for palpitations BP Readings from Last 3 Encounters:  08/11/17 132/82  08/07/17 100/66  06/27/17 110/78  A/P: blood pressure goal of <140/90. Continue current meds    Headache, unspecified headache type S: prior left sided headache associated with blurry vision is now gone.   She does tell me that she gets Pain in top of scalp - several sharp pains for seconds and will disappear. Lingering diffuse headache as well on top of scalps. She reports these Headaches over last 2 years - but no history prior. Had CT angio head and  neck when in ER 05/31/17- I have not felt strongly about additional imaging A/P: she wants to keep appointment with neurology to discuss these headaches over last 2 years. Patient tends to get a lot of reassurance from specialists- as evidenced by recent need for cardiology referral for her PVCs- I think this visit will be meaningful for her from a comfort perspective- she tends to have high anxiety about health related issues  Palpitations Palpitations due to anxiety and PVCs- Patient has done better on beta blocker. Prior trials of counseling or meds in regards to anxiety less helpful.   3 month DM check for POC a1c most likely Future Appointments  Date Time Provider [place]  09/21/2017  3:50 PM Pieter Partridge, DO [facility] None  11/17/2017  3:30 PM Marin Olp, MD [facility]   Lab/Order associations: Controlled type 2 diabetes mellitus without complication, without long-term current use of insulin ([place]) - Plan: Hemoglobin A1c, Comprehensive metabolic panel, CBC, CBC, Comprehensive metabolic panel, Hemoglobin A1c, CANCELED: CBC, CANCELED: Comprehensive metabolic panel, CANCELED: Hemoglobin A1c  Meds ordered this encounter  Medications  . metFORMIN (GLUCOPHAGE) 500 MG tablet    Sig: Take 1 tablet (500 mg total) by mouth 2 (two) times daily with a meal.    Dispense:  180 tablet    Refill:  2    Return precautions advised.  Garret Reddish, MD

## 2017-08-12 LAB — HEMOGLOBIN A1C
EAG (MMOL/L): 8.4 (calc)
HEMOGLOBIN A1C: 6.9 %{Hb} — AB (ref <5.7)
MEAN PLASMA GLUCOSE: 151 (calc)

## 2017-08-12 LAB — CBC
HEMATOCRIT: 42.2 % (ref 35.0–45.0)
Hemoglobin: 14.4 g/dL (ref 11.7–15.5)
MCH: 28.4 pg (ref 27.0–33.0)
MCHC: 34.1 g/dL (ref 32.0–36.0)
MCV: 83.2 fL (ref 80.0–100.0)
MPV: 10.6 fL (ref 7.5–12.5)
Platelets: 285 10*3/uL (ref 140–400)
RBC: 5.07 10*6/uL (ref 3.80–5.10)
RDW: 14.5 % (ref 11.0–15.0)
WBC: 4.8 10*3/uL (ref 3.8–10.8)

## 2017-08-12 LAB — COMPREHENSIVE METABOLIC PANEL
AG Ratio: 1.8 (calc) (ref 1.0–2.5)
ALKALINE PHOSPHATASE (APISO): 60 U/L (ref 33–130)
ALT: 22 U/L (ref 6–29)
AST: 18 U/L (ref 10–35)
Albumin: 4.4 g/dL (ref 3.6–5.1)
BUN: 12 mg/dL (ref 7–25)
CHLORIDE: 101 mmol/L (ref 98–110)
CO2: 28 mmol/L (ref 20–32)
CREATININE: 0.68 mg/dL (ref 0.50–0.99)
Calcium: 9.8 mg/dL (ref 8.6–10.4)
GLUCOSE: 150 mg/dL — AB (ref 65–99)
Globulin: 2.5 g/dL (calc) (ref 1.9–3.7)
Potassium: 3.5 mmol/L (ref 3.5–5.3)
Sodium: 140 mmol/L (ref 135–146)
Total Bilirubin: 0.4 mg/dL (ref 0.2–1.2)
Total Protein: 6.9 g/dL (ref 6.1–8.1)

## 2017-09-06 ENCOUNTER — Other Ambulatory Visit: Payer: Self-pay | Admitting: Family Medicine

## 2017-09-21 ENCOUNTER — Ambulatory Visit: Payer: Self-pay | Admitting: Neurology

## 2017-10-08 NOTE — Progress Notes (Deleted)
NEUROLOGY CONSULTATION NOTE  Dana Mcmahon MRN: [id number] DOB: Sep 02, 1955  Referring provider: Garret Reddish, MD Primary care provider: Garret Reddish, MD  Reason for consult:  Headache, weakness, visual disturbance  HISTORY OF PRESENT ILLNESS: Dana Mcmahon is a 62 year old ***-handed female with type 2 diabetes, hypertension, hyperlipidemia, and chronic neck pain who presents for headache ***  In April, she started having headache of several day duration.  Headaches were left sided severe *** headaches, ***.  She subsequently went to the ED on 05/31/17 with neck pain radiating down both shoulders and associated with bilateral arm pain and weakness.  She had 10 minute episode of double vision.  Her systolic blood pressure was over 200.  Neurologic exam was unremarkable.  CT and CTA of the head demonstrated on acute intracranial abnormality.  CTA of neck demonstrated no dissection or other extracranial arterial abnormality.  Blood pressure was lowered to 338S systolic.  She followed up with her PCP two days later.  Sed Rate was 49.  CRP was 0.6.  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  She underwent temporal artery biopsy on 06/13/17 which was negative.  Headache has since resolved.    However, she would like to discuss other headaches which she has had for ***.    PAST MEDICAL HISTORY: Past Medical History:  Diagnosis Date  . Allergic rhinitis   . Anxiety   . Arthritis   . DM type 2 (diabetes mellitus, type 2) ([place])   . Heart murmur   . Hyperlipidemia    per old records, pt has refused cholesto-lowering meds  . Hypertension   . Neck pain, chronic    Mild DDD, no signif progression (multiple MRI's: 2001, 2003, 2005, 2007)  . Obesity, Class I, BMI 30-34.9   . 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 ARTERY BIOPSY Left 06/13/2017   Procedure: LEFT TEMPORAL  ARTERY BIOPSY;  Surgeon: Johnathan Hausen, MD;  Location: [facility];  Service: General;  Laterality: Left;  . COLONOSCOPY    . none      MEDICATIONS: Current Outpatient Medications on File Prior to Visit  Medication Sig Dispense Refill  . Ascorbic Acid (VITAMIN C PO) Take 1 tablet by mouth daily.    Marland Kitchen BIOTIN PO Take 1 tablet by mouth daily.    . Cholecalciferol (VITAMIN D3 PO) Take 1 capsule by mouth daily.    . diphenhydrAMINE HCl, Sleep, (UNISOM SLEEPGELS) 50 MG CAPS Take 50 mg by mouth at bedtime.     . fluticasone (FLONASE) 50 MCG/ACT nasal spray Place 2 sprays into both nostrils daily. 16 g 5  . glucose blood (ACCU-CHEK GUIDE) test strip Check blood sugar three times daily 100 each 11  . hydrochlorothiazide (HYDRODIURIL) 25 MG tablet Take 12.5 mg by mouth daily.    . hydrochlorothiazide (HYDRODIURIL) 25 MG tablet TAKE 1 TABLET(25 MG) BY MOUTH DAILY 90 tablet 1  . Lancets (ACCU-CHEK MULTICLIX) lancets Check blood sugar three times daily 100 each 11  . metFORMIN (GLUCOPHAGE) 500 MG tablet Take 1 tablet (500 mg total) by mouth 2 (two) times daily with a meal. 180 tablet 2  . metoprolol succinate (TOPROL XL) 25 MG 24 hr tablet Take 0.5 tablets (12.5 mg total) by mouth daily. 45 tablet 3  . Multiple Vitamin (MULTIVITAMIN WITH MINERALS) TABS tablet Take 1 tablet by mouth daily.    Marland Kitchen  POTASSIUM PO Take 95 mg by mouth daily.     . Tetrahydrozoline HCl (VISINE OP) Place 1 drop into both eyes daily as needed (for dry eyes).     No current facility-administered medications on file prior to visit.     ALLERGIES: Allergies  Allergen Reactions  . Codeine Nausea Only  . Penicillins Nausea And Vomiting, Swelling and Other (See Comments)    Has patient had a PCN reaction causing immediate rash, facial/tongue/throat swelling, SOB or lightheadedness with hypotension: Yes Has patient had a PCN reaction causing severe rash involving mucus membranes or skin necrosis: No Has patient had a PCN reaction that  required hospitalization: No Has patient had a PCN reaction occurring within the last 10 years: No If all of the above answers are "NO", then may proceed with Cephalosporin use.   . Latex Rash    FAMILY HISTORY: Family History  Problem Relation Age of Onset  . Stroke Mother        74s, and father 55s  . Hypertension Mother        and father  . Stroke Brother        in 26s  . Colon cancer Neg Hx   . Esophageal cancer Neg Hx   . Rectal cancer Neg Hx   . Stomach cancer Neg Hx     SOCIAL HISTORY: Social History   Socioeconomic History  . Marital status: Married    Spouse name: Not on file  . Number of children: Not on file  . Years of education: Not on file  . Highest education level: Not on file  Occupational History  . Not on file  Social Needs  . Financial resource strain: Not on file  . Food insecurity:    Worry: Not on file    Inability: Not on file  . Transportation needs:    Medical: Not on file    Non-medical: Not on file  Tobacco Use  . Smoking status: Never Smoker  . Smokeless tobacco: Never Used  Substance and Sexual Activity  . Alcohol use: No  . Drug use: No  . Sexual activity: Not on file  Lifestyle  . Physical activity:    Days per week: Not on file    Minutes per session: Not on file  . Stress: Not on file  Relationships  . Social connections:    Talks on phone: Not on file    Gets together: Not on file    Attends religious service: Not on file    Active member of club or organization: Not on file    Attends meetings of clubs or organizations: Not on file    Relationship status: Not on file  . Intimate partner violence:    Fear of current or ex partner: Not on file    Emotionally abused: Not on file    Physically abused: Not on file    Forced sexual activity: Not on file  Other Topics Concern  . Not on file  Social History Narrative   Married, 4 children (daughter Charisma patient here, husband Patsy [place], son Patsy [place]). 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      Hobbies: time with family, watching news, reading             REVIEW OF SYSTEMS: Constitutional: No fevers, chills, or sweats, no generalized fatigue, change in appetite Eyes: No visual  changes, double vision, eye pain Ear, nose and throat: No hearing loss, ear pain, nasal congestion, sore throat Cardiovascular: No chest pain, palpitations Respiratory:  No shortness of breath at rest or with exertion, wheezes GastrointestinaI: No nausea, vomiting, diarrhea, abdominal pain, fecal incontinence Genitourinary:  No dysuria, urinary retention or frequency Musculoskeletal:  No neck pain, back pain Integumentary: No rash, pruritus, skin lesions Neurological: as above Psychiatric: No depression, insomnia, anxiety Endocrine: No palpitations, fatigue, diaphoresis, mood swings, change in appetite, change in weight, increased thirst Hematologic/Lymphatic:  No purpura, petechiae. Allergic/Immunologic: no itchy/runny eyes, nasal congestion, recent allergic reactions, rashes  PHYSICAL EXAM: *** General: No acute distress.  Patient appears ***-groomed.  *** Head:  Normocephalic/atraumatic Eyes:  fundi examined but not visualized Neck: supple, no paraspinal tenderness, full range of motion Back: No paraspinal tenderness Heart: regular rate and rhythm Lungs: Clear to auscultation bilaterally. Vascular: No carotid bruits. Neurological Exam: Mental status: alert and oriented to person, place, and time, recent and remote memory intact, fund of knowledge intact, attention and concentration intact, speech fluent and not dysarthric, language intact. Cranial nerves: CN I: not tested CN II: pupils equal, round and reactive to light, visual fields intact CN III, IV, VI:  full range of motion, no nystagmus, no ptosis CN V: facial sensation intact CN VII: upper and lower face symmetric CN VIII:  hearing intact CN IX, X: gag intact, uvula midline CN XI: sternocleidomastoid and trapezius muscles intact CN XII: tongue midline Bulk & Tone: normal, no fasciculations. Motor:  5/5 throughout *** Sensation:  Pinprick *** temperature *** and vibration sensation intact.  ***. Deep Tendon Reflexes:  2+ throughout, *** toes downgoing.  *** Finger to nose testing:  Without dysmetria.  *** Heel to shin:  Without dysmetria.  *** Gait:  Normal station and stride.  Able to turn and tandem walk. [place] ***.  IMPRESSION: ***  PLAN: ***  Thank you for allowing me to take part in the care of this patient.  Metta Clines, DO  CC: ***

## 2017-10-09 ENCOUNTER — Ambulatory Visit: Payer: Self-pay | Admitting: Neurology

## 2017-11-17 ENCOUNTER — Ambulatory Visit: Payer: Federal, State, Local not specified - PPO | Admitting: Family Medicine

## 2017-12-08 ENCOUNTER — Ambulatory Visit: Payer: Federal, State, Local not specified - PPO | Admitting: Family Medicine

## 2017-12-11 LAB — HM DIABETES EYE EXAM

## 2017-12-12 ENCOUNTER — Encounter: Payer: Self-pay | Admitting: Family Medicine

## 2017-12-14 ENCOUNTER — Other Ambulatory Visit: Payer: Self-pay | Admitting: Family Medicine

## 2017-12-14 DIAGNOSIS — Z1231 Encounter for screening mammogram for malignant neoplasm of breast: Secondary | ICD-10-CM

## 2018-01-16 ENCOUNTER — Encounter: Payer: Self-pay | Admitting: Family Medicine

## 2018-01-16 ENCOUNTER — Ambulatory Visit (INDEPENDENT_AMBULATORY_CARE_PROVIDER_SITE_OTHER): Payer: Federal, State, Local not specified - PPO | Admitting: Family Medicine

## 2018-01-16 VITALS — BP 144/98 | HR 66 | Temp 97.6°F | Ht 64.0 in | Wt 166.8 lb

## 2018-01-16 DIAGNOSIS — Z23 Encounter for immunization: Secondary | ICD-10-CM

## 2018-01-16 DIAGNOSIS — E119 Type 2 diabetes mellitus without complications: Secondary | ICD-10-CM | POA: Diagnosis not present

## 2018-01-16 DIAGNOSIS — Z6826 Body mass index (BMI) 26.0-26.9, adult: Secondary | ICD-10-CM

## 2018-01-16 DIAGNOSIS — R002 Palpitations: Secondary | ICD-10-CM

## 2018-01-16 DIAGNOSIS — R519 Headache, unspecified: Secondary | ICD-10-CM

## 2018-01-16 DIAGNOSIS — I10 Essential (primary) hypertension: Secondary | ICD-10-CM

## 2018-01-16 DIAGNOSIS — E785 Hyperlipidemia, unspecified: Secondary | ICD-10-CM | POA: Diagnosis not present

## 2018-01-16 DIAGNOSIS — R51 Headache: Secondary | ICD-10-CM

## 2018-01-16 LAB — POCT GLYCOSYLATED HEMOGLOBIN (HGB A1C): HEMOGLOBIN A1C: 6.7 % — AB (ref 4.0–5.6)

## 2018-01-16 MED ORDER — ROSUVASTATIN CALCIUM 5 MG PO TABS: 5.0000 mg | ORAL_TABLET | ORAL | 3 refills | Status: DC

## 2018-01-16 NOTE — Assessment & Plan Note (Signed)
PVCs continue to do better on metoprolol

## 2018-01-16 NOTE — Patient Instructions (Addendum)
We discussed blood pressure goal of <140/90. Continue current meds for now- her home #s have been in 130s range over 80 or so. She is going to check when she gets home and make sure it goes back down. I asked her to see [place] back within 2-3 weeks- bring home cuff with her and let us recheck this to make sure we do not need to go back up on the hydrochlorothiazide to full dose  congrats on a1c- you can enjoy thanksgiving and then get back on it!   Trial rosuvastatin 5 mg once a week

## 2018-01-16 NOTE — Assessment & Plan Note (Signed)
S: controlled on hydrochlorthiazide 25 mg (she cut this to 12.5mg ) and metoprolol 12.5 mg XR once a day  Not sleeping well and 2 cups of coffee today- having hard time falling asleep and taking unisom. Melatonin doesn't work.  BP Readings from Last 3 Encounters:  01/16/18 (!) 144/98  08/11/17 132/82  08/07/17 100/66  A/P: We discussed blood pressure goal of <140/90. Continue current meds for now- her home #s have been in 130s range over 80 or so. She is going to check when she gets home and make sure it goes back down. I asked her to see [place] back within 2-3 weeks- bring home cuff with her and let us recheck this to make sure we do not need to go back up on the hydrochlorothiazide to full dose

## 2018-01-16 NOTE — Assessment & Plan Note (Signed)
S: Well controlled on last visit on metformin 1000 mg twice a day but she was having diarrhea at this dose.  We reduced back to 500 mg twice a day at that time- she actually is only on 500mg  once a day and fortunately looks great.  Her diarrhea/loose stools.  We discussed possibly adding Januvia if needed instead of increasing metformin Exercise and diet- weight stable from June Lab Results  Component Value Date   HGBA1C 6.9 (H) 08/11/2017   HGBA1C 7.3 (H) 04/10/2017   HGBA1C 6.9 01/02/2017  A/P: stable- continue current medicines

## 2018-01-16 NOTE — Assessment & Plan Note (Signed)
S: Poorly controlled on no rx Lab Results  Component Value Date   CHOL 248 (H) 09/11/2015   HDL 47.90 09/11/2015   LDLDIRECT 112.0 09/06/2016   TRIG 308.0 (H) 09/11/2015   CHOLHDL 5 09/11/2015   A/P: Discussed role of statins in patients with diabetes-advised rosuvastatin 5 mg once a week- agrees to reluctantly

## 2018-01-16 NOTE — Progress Notes (Signed)
Subjective:  Dana Mcmahon is a 62 y.o. year old very pleasant female patient who presents for/with See problem oriented charting ROS- No chest pain or shortness of breath. No headache or blurry vision.    Past Medical History-  Patient Active Problem List   Diagnosis Date Noted  . Diabetes mellitus type II, controlled ([place]) 05/10/2013    Priority: High  . Headache, unspecified headache type 06/03/2017    Priority: Medium  . History of adenomatous polyp of colon 07/25/2016    Priority: Medium  . Hyperlipidemia     Priority: Medium  . HTN (hypertension), benign 05/10/2013    Priority: Medium  . Palpitations 05/10/2013    Priority: Medium  . Osteoarthritis, hand 12/16/2014    Priority: Low  . Allergic rhinitis 07/24/2014    Priority: Low  . Obesity, Class I, BMI 30-34.9     Priority: Low  . Dizzy spells 08/08/2013    Priority: Low  . Upper airway cough syndrome 01/22/2016    Medications- reviewed and updated Current Outpatient Medications  Medication Sig Dispense Refill  . Ascorbic Acid (VITAMIN C PO) Take 1 tablet by mouth daily.    Marland Kitchen BIOTIN PO Take 1 tablet by mouth daily.    . Cholecalciferol (VITAMIN D3 PO) Take 1 capsule by mouth daily.    . diphenhydrAMINE HCl, Sleep, (UNISOM SLEEPGELS) 50 MG CAPS Take 50 mg by mouth at bedtime.     . fluticasone (FLONASE) 50 MCG/ACT nasal spray Place 2 sprays into both nostrils daily. 16 g 5  . glucose blood (ACCU-CHEK GUIDE) test strip Check blood sugar three times daily 100 each 11  . hydrochlorothiazide (HYDRODIURIL) 25 MG tablet Take 12.5 mg by mouth daily.    . Lancets (ACCU-CHEK MULTICLIX) lancets Check blood sugar three times daily 100 each 11  . metFORMIN (GLUCOPHAGE) 500 MG tablet Take 1 tablet (500 mg total) by mouth 2 (two) times daily with a meal. 180 tablet 2  . metoprolol succinate (TOPROL XL) 25 MG 24 hr tablet Take 0.5 tablets (12.5 mg total) by mouth daily. 45 tablet 3  . Multiple Vitamin (MULTIVITAMIN WITH MINERALS)  TABS tablet Take 1 tablet by mouth daily.    Marland Kitchen POTASSIUM PO Take 95 mg by mouth daily.     . rosuvastatin (CRESTOR) 5 MG tablet Take 1 tablet (5 mg total) by mouth once a week. 13 tablet 3   No current facility-administered medications for this visit.     Objective: BP (!) 144/98 (BP Location: Left Arm, Cuff Size: Normal)   Pulse 66   Temp 97.6 F (36.4 C) (Oral)   Ht 5\' 4"  (1.626 m)   Wt 166 lb 12.8 oz (75.7 kg)   LMP  (LMP Unknown)   SpO2 95%   BMI 28.63 kg/m  Gen: NAD, resting comfortably CV: RRR no murmurs rubs or gallops Lungs: CTAB no crackles, wheeze, rhonchi Abdomen: soft/nontender/nondistended/normal bowel sounds. No rebound or guarding.  Ext: no edema Skin: warm, dry MSK- at end of visit mentions trouble lifting right arm overhead- demonstrates about 110 degrees abduction and forward flexion  Assessment/Plan:  Other notes: 1.Latex allergy with rash only but tolerated flu shot last year- we will give flu shot today given low risk of allergic reaction to latex in the past. 2.  ipad before bed and not sleeping well-advised against this.  She asks about using medication-are using Unisom.  Advised sparing use due to potential for tolerance on doxylamine.  She states melatonin did not work.  We discussed possible trazodone-she declines for now 3.  She mentions at the end of visit right shoulder pain-I advised her to schedule a visit with Dr. Paulla Fore if fails to improve-she thinks she slept on it awkwardly..  I am also happy to see her back if needed.  Diabetes mellitus type II, controlled ([place]) S: Well controlled on last visit on metformin 1000 mg twice a day but she was having diarrhea at this dose.  We reduced back to 500 mg twice a day at that time- she actually is only on 500mg  once a day and fortunately looks great.  Her diarrhea/loose stools.  We discussed possibly adding Januvia if needed instead of increasing metformin Exercise and diet- weight stable from June Lab  Results  Component Value Date   HGBA1C 6.9 (H) 08/11/2017   HGBA1C 7.3 (H) 04/10/2017   HGBA1C 6.9 01/02/2017  A/P: stable- continue current medicines  HTN (hypertension), benign S: controlled on hydrochlorthiazide 25 mg (she cut this to 12.5mg ) and metoprolol 12.5 mg XR once a day  Not sleeping well and 2 cups of coffee today- having hard time falling asleep and taking unisom. Melatonin doesn't work.  BP Readings from Last 3 Encounters:  01/16/18 (!) 144/98  08/11/17 132/82  08/07/17 100/66  A/P: We discussed blood pressure goal of <140/90. Continue current meds for now- her home #s have been in 130s range over 80 or so. She is going to check when she gets home and make sure it goes back down. I asked her to see [place] back within 2-3 weeks- bring home cuff with her and let us recheck this to make sure we do not need to go back up on the hydrochlorothiazide to full dose  Headache, unspecified headache type Intermittent headaches over last 2 years- had CT angios head and neck when in the ER 05/31/2017.  Had referred to neurology-she no showed her appointment with Dr. Tomi Likens on 10/09/2017.  Fortunately headaches have resolved.  I did ask her to call far in advance if she does not need an appointment in the future to allow rescheduling  Palpitations PVCs continue to do better on metoprolol  Hyperlipidemia S: Poorly controlled on no rx Lab Results  Component Value Date   CHOL 248 (H) 09/11/2015   HDL 47.90 09/11/2015   LDLDIRECT 112.0 09/06/2016   TRIG 308.0 (H) 09/11/2015   CHOLHDL 5 09/11/2015   A/P: Discussed role of statins in patients with diabetes-advised rosuvastatin 5 mg once a week- agrees to reluctantly  Patient knows she needs to continue to work on healthy eating and regular exercise.  She asks about doing a partial keto diet-we discussed this likely would not put her into ketosis which is the point of the keto diet-instead advised traditional Mediterranean/ dashtype eating  plan Future Appointments  Date Time Provider [place]  01/26/2018  4:20 PM [facility]  05/24/2018  9:20 AM Marin Olp, MD [facility]   Return in about 4 months (around 05/17/2018) for physical.  Lab/Order associations: Controlled type 2 diabetes mellitus without complication, without long-term current use of insulin ([place]) - Plan: POCT glycosylated hemoglobin (Hb A1C)  Need for prophylactic vaccination and inoculation against influenza - Plan: Flu Vaccine QUAD 36+ mos IM  Meds ordered this encounter  Medications  . rosuvastatin (CRESTOR) 5 MG tablet    Sig: Take 1 tablet (5 mg total) by mouth once a week.    Dispense:  13 tablet    Refill:  3   Return precautions advised.  Garret Reddish, MD

## 2018-01-16 NOTE — Assessment & Plan Note (Addendum)
Intermittent headaches over last 2 years- had CT angios head and neck when in the ER 05/31/2017.  Had referred to neurology-she no showed her appointment with Dr. Tomi Likens on 10/09/2017.  Fortunately headaches have resolved.  I did ask her to call far in advance if she does not need an appointment in the future to allow rescheduling

## 2018-01-17 NOTE — Progress Notes (Signed)
Patient is now scheduled for December 19.

## 2018-01-26 ENCOUNTER — Ambulatory Visit
Admission: RE | Admit: 2018-01-26 | Discharge: 2018-01-26 | Disposition: A | Discharge status: Home / Self Care | Payer: Federal, State, Local not specified - PPO | Source: Ambulatory Visit | Attending: Family Medicine | Admitting: Family Medicine

## 2018-01-26 DIAGNOSIS — Z1231 Encounter for screening mammogram for malignant neoplasm of breast: Secondary | ICD-10-CM | POA: Diagnosis not present

## 2018-02-08 ENCOUNTER — Ambulatory Visit: Payer: Federal, State, Local not specified - PPO | Admitting: Family Medicine

## 2018-04-06 ENCOUNTER — Ambulatory Visit: Payer: Federal, State, Local not specified - PPO | Admitting: Family Medicine

## 2018-04-27 ENCOUNTER — Ambulatory Visit (INDEPENDENT_AMBULATORY_CARE_PROVIDER_SITE_OTHER): Payer: Federal, State, Local not specified - PPO | Admitting: Family Medicine

## 2018-04-27 ENCOUNTER — Encounter: Payer: Self-pay | Admitting: Family Medicine

## 2018-04-27 VITALS — BP 138/82 | HR 75 | Temp 97.5°F | Ht 64.0 in | Wt 168.8 lb

## 2018-04-27 DIAGNOSIS — E663 Overweight: Secondary | ICD-10-CM

## 2018-04-27 DIAGNOSIS — I152 Hypertension secondary to endocrine disorders: Secondary | ICD-10-CM

## 2018-04-27 DIAGNOSIS — R519 Headache, unspecified: Secondary | ICD-10-CM

## 2018-04-27 DIAGNOSIS — E1159 Type 2 diabetes mellitus with other circulatory complications: Secondary | ICD-10-CM | POA: Diagnosis not present

## 2018-04-27 DIAGNOSIS — E785 Hyperlipidemia, unspecified: Secondary | ICD-10-CM

## 2018-04-27 DIAGNOSIS — E119 Type 2 diabetes mellitus without complications: Secondary | ICD-10-CM | POA: Diagnosis not present

## 2018-04-27 DIAGNOSIS — E1169 Type 2 diabetes mellitus with other specified complication: Secondary | ICD-10-CM | POA: Diagnosis not present

## 2018-04-27 DIAGNOSIS — R51 Headache: Secondary | ICD-10-CM | POA: Diagnosis not present

## 2018-04-27 DIAGNOSIS — Z6828 Body mass index (BMI) 28.0-28.9, adult: Secondary | ICD-10-CM

## 2018-04-27 DIAGNOSIS — I10 Essential (primary) hypertension: Secondary | ICD-10-CM

## 2018-04-27 LAB — POCT GLYCOSYLATED HEMOGLOBIN (HGB A1C): Hemoglobin A1C: 7.2 % — AB (ref 4.0–5.6)

## 2018-04-27 MED ORDER — ROSUVASTATIN CALCIUM 5 MG PO TABS: 5.0000 mg | ORAL_TABLET | ORAL | 3 refills | Status: DC

## 2018-04-27 NOTE — Assessment & Plan Note (Signed)
S: still getting some stress headaches mainly on the left side. advil or tylenol helps.  Had negative temporal artery biopsy a few years ago.  No vision loss reported A/P: Still with intermittent headaches-intermittent Advil or Tylenol is reasonable-will update kidney function next visit given NSAID use

## 2018-04-27 NOTE — Progress Notes (Signed)
Phone [phone]   Subjective:  Dana Mcmahon is a 63 y.o. year old very pleasant female patient who presents for/with See problem oriented charting ROS-no hypoglycemia reported.  Some headaches.  No chest pain or shortness of breath reported.  Some palpitations.    BMI Metric Follow Up - 04/27/18 1733      BMI Metric Follow Up-Please document annually   BMI Metric Follow Up  Education provided        Past Medical History-  Patient Active Problem List   Diagnosis Date Noted  . Diabetes mellitus type II, controlled ([place]) 05/10/2013    Priority: High  . Headache, unspecified headache type 06/03/2017    Priority: Medium  . History of adenomatous polyp of colon 07/25/2016    Priority: Medium  . Hyperlipidemia associated with type 2 diabetes mellitus ([place])     Priority: Medium  . Hypertension associated with diabetes ([place]) 05/10/2013    Priority: Medium  . Palpitations 05/10/2013    Priority: Medium  . Osteoarthritis, hand 12/16/2014    Priority: Low  . Allergic rhinitis 07/24/2014    Priority: Low  . Obesity, Class I, BMI 30-34.9     Priority: Low  . Dizzy spells 08/08/2013    Priority: Low  . Upper airway cough syndrome 01/22/2016    Medications- reviewed and updated Current Outpatient Medications  Medication Sig Dispense Refill  . Ascorbic Acid (VITAMIN C PO) Take 1 tablet by mouth daily.    Marland Kitchen BIOTIN PO Take 1 tablet by mouth daily.    . Cholecalciferol (VITAMIN D3 PO) Take 1 capsule by mouth daily.    . diphenhydrAMINE HCl, Sleep, (UNISOM SLEEPGELS) 50 MG CAPS Take 50 mg by mouth at bedtime.     . fluticasone (FLONASE) 50 MCG/ACT nasal spray Place 2 sprays into both nostrils daily. 16 g 5  . glucose blood (ACCU-CHEK GUIDE) test strip Check blood sugar three times daily 100 each 11  . hydrochlorothiazide (HYDRODIURIL) 25 MG tablet Take 12.5 mg by mouth daily.    . Lancets (ACCU-CHEK MULTICLIX) lancets Check blood sugar three times daily 100 each 11  .  metFORMIN (GLUCOPHAGE) 500 MG tablet Take 1 tablet (500 mg total) by mouth 2 (two) times daily with a meal. 180 tablet 2  . metoprolol succinate (TOPROL XL) 25 MG 24 hr tablet Take 0.5 tablets (12.5 mg total) by mouth daily. 45 tablet 3  . Multiple Vitamin (MULTIVITAMIN WITH MINERALS) TABS tablet Take 1 tablet by mouth daily.    Marland Kitchen POTASSIUM PO Take 95 mg by mouth daily.     . rosuvastatin (CRESTOR) 5 MG tablet Take 1 tablet (5 mg total) by mouth once a week. 13 tablet 3   No current facility-administered medications for this visit.      Objective:  BP 138/82 (BP Location: Left Arm, Patient Position: Sitting, Cuff Size: Normal)   Pulse 75   Temp (!) 97.5 F (36.4 C) (Oral)   Ht 5\' 4"  (1.626 m)   Wt 168 lb 12.8 oz (76.6 kg)   LMP  (LMP Unknown)   SpO2 97%   BMI 28.97 kg/m  Gen: NAD, resting comfortably CV: RRR no murmurs rubs or gallops Lungs: CTAB no crackles, wheeze, rhonchi Abdomen: soft/nontender/nondistended/normal bowel sounds.  Overweight Ext: no edema Skin: warm, dry     Assessment and Plan   # Diabetes S: compliant with metformin 500mg  once daily. Was having diarrhea on higher doses. Possibly could add [place] if needed if gets high and  cannot control with diet/exercise.   Exercise has been down lately but plans to restart Lab Results  Component Value Date   HGBA1C 7.2 (A) 04/27/2018  A/P: Poor control with A1c of 7.2 today.  Patient does not want to increase medication.  She plans to try to get back on her regular exercise program and improve her diet and we will reevaluate in 4 months.  We did discuss Januvia potentially since she has not done well on higher doses of metformin due to diarrhea  Headache, unspecified headache type S: still getting some stress headaches mainly on the left side. advil or tylenol helps.  Had negative temporal artery biopsy a few years ago.  No vision loss reported A/P: Still with intermittent headaches-intermittent Advil or Tylenol is  reasonable-will update kidney function next visit given NSAID use  Hyperlipidemia associated with type 2 diabetes mellitus ([place]) S: Last visit she agreed to start rosuvastatin 5 mg once a week.  Unfortunately she did not follow through with this. A/P: Suspect poor control of hyperlipidemia-we discussed the strong role of statins in diabetes-she agrees once again to start taking this  Hypertension associated with diabetes ([place]) S:compliant with hctz 12.5mg  once a day as well as metoprolol 12.5mg  extended release.   A/P:  Stable. Continue current medications.   Other notes: 1.notes palpitations before bed about once a week.    52-month physical advised Future Appointments  Date Time Provider [place]  10/19/2018  2:40 PM Marin Olp, MD [facility]   Lab/Order associations: Controlled type 2 diabetes mellitus without complication, without long-term current use of insulin ([facility]) - Plan: POCT HgB A1C  Headache, unspecified headache type  Hypertension associated with diabetes ([place])  Hyperlipidemia associated with type 2 diabetes mellitus ([place])  BMI 28.0-28.9,adult  Overweight  Meds ordered this encounter  Medications  . rosuvastatin (CRESTOR) 5 MG tablet    Sig: Take 1 tablet (5 mg total) by mouth once a week.    Dispense:  13 tablet    Refill:  3    Return precautions advised.  Garret Reddish, MD

## 2018-04-27 NOTE — Assessment & Plan Note (Signed)
S: Last visit she agreed to start rosuvastatin 5 mg once a week.  Unfortunately she did not follow through with this. A/P: Suspect poor control of hyperlipidemia-we discussed the strong role of statins in diabetes-she agrees once again to start taking this

## 2018-04-27 NOTE — Assessment & Plan Note (Signed)
S:compliant with hctz 12.5mg  once a day as well as metoprolol 12.5mg  extended release.   A/P:  Stable. Continue current medications.

## 2018-04-27 NOTE — Patient Instructions (Signed)
Plan of action: 1. Start back on your regular exercise- we are going to use this instead of adding more medicine to get your diabetes numbers back down. Goal under 7 and you are slightly above at 7.2 today.  2. Also improve healthy eating habits as you have before 3. Start rosuvastatin 5mg  once a week- this low dose is unlikely to cause side effects but also has a good chance of reducing heart attack and stroke risk  No other changes today  4 month follow up - schedule physical before you leave. You can push back the April visit to 4 months.

## 2018-05-03 ENCOUNTER — Other Ambulatory Visit: Payer: Self-pay | Admitting: Family Medicine

## 2018-05-03 MED ORDER — METFORMIN HCL 500 MG PO TABS: 500.0000 mg | ORAL_TABLET | Freq: Two times a day (BID) | ORAL | 1 refills | Status: DC

## 2018-05-03 NOTE — Telephone Encounter (Signed)
Copied from [place] [phone]. Topic: Quick Communication - Rx Refill/Question >> May 03, 2018  3:56 PM Colvin Caroli N wrote: Medication: metFORMIN (GLUCOPHAGE) 500 MG tablet  Patient is requesting refill of this medication.   Preferred Pharmacy (with phone number or street name):[facility] [place], [address] AT [address]  [phone] (Phone) [phone] (Fax)

## 2018-05-24 ENCOUNTER — Encounter: Payer: Federal, State, Local not specified - PPO | Admitting: Family Medicine

## 2018-07-02 ENCOUNTER — Ambulatory Visit (INDEPENDENT_AMBULATORY_CARE_PROVIDER_SITE_OTHER): Payer: Federal, State, Local not specified - PPO | Admitting: Family Medicine

## 2018-07-02 ENCOUNTER — Encounter: Payer: Self-pay | Admitting: Family Medicine

## 2018-07-02 VITALS — Ht 64.0 in | Wt 168.8 lb

## 2018-07-02 DIAGNOSIS — E1159 Type 2 diabetes mellitus with other circulatory complications: Secondary | ICD-10-CM

## 2018-07-02 DIAGNOSIS — I10 Essential (primary) hypertension: Secondary | ICD-10-CM | POA: Diagnosis not present

## 2018-07-02 DIAGNOSIS — M79671 Pain in right foot: Secondary | ICD-10-CM | POA: Diagnosis not present

## 2018-07-02 DIAGNOSIS — M25571 Pain in right ankle and joints of right foot: Secondary | ICD-10-CM | POA: Diagnosis not present

## 2018-07-02 DIAGNOSIS — I152 Hypertension secondary to endocrine disorders: Secondary | ICD-10-CM

## 2018-07-02 MED ORDER — DICLOFENAC SODIUM 75 MG PO TBEC: 75.0000 mg | DELAYED_RELEASE_TABLET | Freq: Two times a day (BID) | ORAL | 0 refills | Status: DC

## 2018-07-02 NOTE — Progress Notes (Signed)
Phone [phone]   Subjective:  Virtual visit via Video note. Chief complaint: Chief Complaint  Patient presents with  . Foot Swelling    Sx started a month ago. R foot more swollen then left.    This visit type was conducted due to national recommendations for restrictions regarding the COVID-19 Pandemic (e.g. social distancing).  This format is felt to be most appropriate for this patient at this time balancing risks to patient and risks to population by having him in for in person visit.  No physical exam was performed (except for noted visual exam or audio findings with Telehealth visits).    Our team/I connected with Birdie Sons on 07/02/18 at  4:20 PM EDT by a video enabled telemedicine application ([email or web address]) and verified that I am speaking with the correct person using two identifiers.  Location patient: [place] Location provider: [facility], office Persons participating in the virtual visit:  patient  Our team/I discussed the limitations of evaluation and management by telemedicine and the availability of in person appointments. In light of current covid-19 pandemic, patient also understands that we are trying to protect them by minimizing in office contact if at all possible.  The patient expressed consent for telemedicine visit and agreed to proceed. Patient understands insurance will be billed.   ROS- no fever/chills/cough/sore throat/ body aches/shortness of breath   Past Medical History-  Patient Active Problem List   Diagnosis Date Noted  . Diabetes mellitus type II, controlled ([place]) 05/10/2013    Priority: High  . Headache, unspecified headache type 06/03/2017    Priority: Medium  . History of adenomatous polyp of colon 07/25/2016    Priority: Medium  . Hyperlipidemia associated with type 2 diabetes mellitus ([place])     Priority: Medium  . Hypertension associated with diabetes ([place]) 05/10/2013    Priority: Medium  . Palpitations 05/10/2013    Priority:  Medium  . Osteoarthritis, hand 12/16/2014    Priority: Low  . Allergic rhinitis 07/24/2014    Priority: Low  . Obesity, Class I, BMI 30-34.9     Priority: Low  . Dizzy spells 08/08/2013    Priority: Low  . Upper airway cough syndrome 01/22/2016    Medications- reviewed and updated Current Outpatient Medications  Medication Sig Dispense Refill  . Ascorbic Acid (VITAMIN C PO) Take 1 tablet by mouth daily.    Marland Kitchen BIOTIN PO Take 1 tablet by mouth daily.    . Cholecalciferol (VITAMIN D3 PO) Take 1 capsule by mouth daily.    . diphenhydrAMINE HCl, Sleep, (UNISOM SLEEPGELS) 50 MG CAPS Take 50 mg by mouth at bedtime.     Marland Kitchen glucose blood (ACCU-CHEK GUIDE) test strip Check blood sugar three times daily 100 each 11  . hydrochlorothiazide (HYDRODIURIL) 25 MG tablet TAKE 1 TABLET(25 MG) BY MOUTH DAILY 90 tablet 0  . Lancets (ACCU-CHEK MULTICLIX) lancets Check blood sugar three times daily 100 each 11  . metFORMIN (GLUCOPHAGE) 500 MG tablet Take 1 tablet (500 mg total) by mouth 2 (two) times daily with a meal. 180 tablet 1  . metoprolol succinate (TOPROL XL) 25 MG 24 hr tablet Take 0.5 tablets (12.5 mg total) by mouth daily. 45 tablet 3  . Multiple Vitamin (MULTIVITAMIN WITH MINERALS) TABS tablet Take 1 tablet by mouth daily.    Marland Kitchen POTASSIUM PO Take 95 mg by mouth daily.     . rosuvastatin (CRESTOR) 5 MG tablet Take 1 tablet (5 mg total) by mouth once a week. 13 tablet  3  . diclofenac (VOLTAREN) 75 MG EC tablet Take 1 tablet (75 mg total) by mouth 2 (two) times daily. 20 tablet 0  . fluticasone (FLONASE) 50 MCG/ACT nasal spray Place 2 sprays into both nostrils daily. (Patient not taking: Reported on 07/02/2018) 16 g 5   No current facility-administered medications for this visit.      Objective:  Ht 5\' 4"  (1.626 m)   Wt 168 lb 12.8 oz (76.6 kg)   LMP  (LMP Unknown)   BMI 28.97 kg/m  self reported vitals Gen: NAD, resting comfortably Lungs: nonlabored, normal respiratory rate  Skin: appears  dry, no obvious rash Right mtp joint appears red- patient reports pain with dorsiflexion or plantar flexion of toes. She admits worsening pain when she pushes on those areas.     Assessment and Plan   # Acute right ankle pain - Plan: DG Ankle Complete Right  Right foot pain - Plan: DG Foot Complete Right S:started with swelling in feet about a month ago- left foot has gone down since that time. Right foot at MTP joint is very swollen, red. Even lightly rubbing along that joint causes pain. Feels liek warm over that area.  Also swollen over right lateral malleolus. Felt like was getting better over weekend then worsened. Denied fall or injury. No open cuts/wounds  Feels like it hurts worse if is stationary then gets up and moves. Elevating foot helps some. Couldn't get to sleep last night due to pain.   Not swollen into calf/not having calf pain A/P: with swelling over MTP with redness, tenderness to touch- most worried about a gout flare up (no known history of gout- is on hctz which would increase risk).  We will trial diclofenac 75 mg twice a day-A1c above 7- avoid prednisone.  I doubt fracture but with severity of pain-we will get Foot and ankle x-ray.  We discussed I would have a low threshold for referring her to sports medicine.  It is somewhat atypical to have gout flare in 2 areas though. -She reported blood pressure up to 162/104 but is in acute pain-actually think NSAIDs may lower her blood pressure overall - Would need to plan to get a uric acid at a future visit -No calf pain or swelling-doubt DVT and do not think it would cause such localized pain at MTP and ankle joint  She will call for x-ray tomorrow- declines COVID-19 symptoms and agrees to wear a mask Future Appointments  Date Time Provider [place]  10/19/2018  2:40 PM Marin Olp, MD [facility]   Lab/Order associations: Acute right ankle pain - Plan: DG Ankle Complete Right  Right foot pain - Plan: DG  Foot Complete Right  Hypertension associated with diabetes ([place])  Meds ordered this encounter  Medications  . diclofenac (VOLTAREN) 75 MG EC tablet    Sig: Take 1 tablet (75 mg total) by mouth 2 (two) times daily.    Dispense:  20 tablet    Refill:  0   Return precautions advised.  Garret Reddish, MD

## 2018-07-02 NOTE — Patient Instructions (Addendum)
Health Maintenance Due  Topic Date Due  . FOOT EXAM will be postponed for future in office visit 06/28/2018    Depression screen [facility] 2/9 01/16/2018 01/02/2017  Decreased Interest 0 0  Down, Depressed, Hopeless 0 0  PHQ - 2 Score 0 0   Video visit

## 2018-07-04 ENCOUNTER — Telehealth: Payer: Self-pay | Admitting: Family Medicine

## 2018-07-04 NOTE — Telephone Encounter (Signed)
See note  Copied from [place] [id number]. Topic: General - Other >> Jul 04, 2018  9:24 AM Rayann Heman wrote: Reason for CRM: pt called and stated that she does not want an xray done but would like a call back from the nurse regarding. Please advise

## 2018-07-05 NOTE — Telephone Encounter (Signed)
F.Y.I   I called patient and she wanted to let Dr. Yong Channel know that the swelling has gone down.   It is not as bad as the time that he saw it, and her pain level is at 4-5 now.  Patient wants to know what to do after she finishes the medication.  I informed patient to finish out the medication and then she could set up a follow up visit to discuss the condition of her ankle at that time with Dr. Yong Channel. I explained if condition of ankle/leg worsens then to call office back.

## 2018-07-05 NOTE — Telephone Encounter (Signed)
Dana Mcmahon- that's great advise- id also be willing to place a sports medicine or ortho referral if issue persists

## 2018-07-12 ENCOUNTER — Telehealth: Payer: Self-pay | Admitting: Family Medicine

## 2018-07-12 ENCOUNTER — Other Ambulatory Visit: Payer: Self-pay | Admitting: Family Medicine

## 2018-07-12 NOTE — Telephone Encounter (Signed)
Great news! Noted thanks

## 2018-07-12 NOTE — Telephone Encounter (Signed)
Noted. FYI 

## 2018-07-12 NOTE — Telephone Encounter (Signed)
Copied from [place] [phone]. Topic: General - Other >> Jul 12, 2018  9:26 AM Carolyn Stare wrote:  Pt call to say her right foot with gout is 95% better

## 2018-08-07 ENCOUNTER — Other Ambulatory Visit: Payer: Self-pay | Admitting: Family Medicine

## 2018-08-09 ENCOUNTER — Other Ambulatory Visit: Payer: Self-pay | Admitting: Cardiovascular Disease

## 2018-10-19 ENCOUNTER — Encounter: Payer: Federal, State, Local not specified - PPO | Admitting: Family Medicine

## 2018-11-11 ENCOUNTER — Other Ambulatory Visit: Payer: Self-pay | Admitting: Cardiovascular Disease

## 2018-12-04 ENCOUNTER — Telehealth: Payer: Self-pay | Admitting: Cardiovascular Disease

## 2018-12-04 MED ORDER — METOPROLOL SUCCINATE ER 25 MG PO TB24: 12.5000 mg | ORAL_TABLET | Freq: Every day | ORAL | 0 refills | Status: DC

## 2018-12-04 NOTE — Telephone Encounter (Signed)
New Message   *STAT* If patient is at the pharmacy, call can be transferred to refill team.   1. Which medications need to be refilled? (please list name of each medication and dose if known) metoprolol succinate (TOPROL-XL) 25 MG 24 hr tablet   2. Which pharmacy/location (including street and city if local pharmacy) is medication to be sent to? [id number] - [place],  - [address]  3. Do they need a 30 day or 90 day supply? 90 day  

## 2018-12-04 NOTE — Telephone Encounter (Signed)
Pt's medication was sent to pt's pharmacy as requested. Confirmation received.  °

## 2018-12-31 ENCOUNTER — Telehealth: Payer: Self-pay

## 2018-12-31 ENCOUNTER — Telehealth (INDEPENDENT_AMBULATORY_CARE_PROVIDER_SITE_OTHER): Payer: Federal, State, Local not specified - PPO | Admitting: Family Medicine

## 2018-12-31 DIAGNOSIS — M25572 Pain in left ankle and joints of left foot: Secondary | ICD-10-CM | POA: Diagnosis not present

## 2018-12-31 DIAGNOSIS — M79672 Pain in left foot: Secondary | ICD-10-CM | POA: Diagnosis not present

## 2018-12-31 MED ORDER — DICLOFENAC SODIUM 75 MG PO TBEC: 75.0000 mg | DELAYED_RELEASE_TABLET | Freq: Two times a day (BID) | ORAL | 0 refills | Status: DC

## 2018-12-31 NOTE — Telephone Encounter (Signed)
Pt has never been diagnosed with gout from what I can tell. She will need a virtual visit or OV in order to be Rx'd gout medication. Please schedule her for either virtual or in office.

## 2018-12-31 NOTE — Telephone Encounter (Signed)
Copied from [place] #[id number]. Topic: General - Other >> Dec 31, 2018  8:23 AM Carolyn Stare wrote: Pt said she has gout in her foot and is requesting gout medicine, pt said she can not walk    [place]

## 2018-12-31 NOTE — Telephone Encounter (Signed)
See below

## 2018-12-31 NOTE — Patient Instructions (Signed)
Gout  Gout is painful swelling of your joints. Gout is a type of arthritis. It is caused by having too much uric acid in your body. Uric acid is a chemical that is made when your body breaks down substances called purines. If your body has too much uric acid, sharp crystals can form and build up in your joints. This causes pain and swelling. Gout attacks can happen quickly and be very painful (acute gout). Over time, the attacks can affect more joints and happen more often (chronic gout). What are the causes?  Too much uric acid in your blood. This can happen because: ? Your kidneys do not remove enough uric acid from your blood. ? Your body makes too much uric acid. ? You eat too many foods that are high in purines. These foods include organ meats, some seafood, and beer.  Trauma or stress. What increases the risk?  Having a family history of gout.  Being female and middle-aged.  Being female and having gone through menopause.  Being very overweight (obese).  Drinking alcohol, especially beer.  Not having enough water in the body (being dehydrated).  Losing weight too quickly.  Having an organ transplant.  Having lead poisoning.  Taking certain medicines.  Having kidney disease.  Having a skin condition called psoriasis. What are the signs or symptoms? An attack of acute gout usually happens in just one joint. The most common place is the big toe. Attacks often start at night. Other joints that may be affected include joints of the feet, ankle, knee, fingers, wrist, or elbow. Symptoms of an attack may include:  Very bad pain.  Warmth.  Swelling.  Stiffness.  Shiny, red, or purple skin.  Tenderness. The affected joint may be very painful to touch.  Chills and fever. Chronic gout may cause symptoms more often. More joints may be involved. You may also have white or yellow lumps (tophi) on your hands or feet or in other areas near your joints. How is this treated?   Treatment for this condition has two phases: treating an acute attack and preventing future attacks.  Acute gout treatment may include: ? NSAIDs. ? Steroids. These are taken by mouth or injected into a joint. ? Colchicine. This medicine relieves pain and swelling. It can be given by mouth or through an IV tube.  Preventive treatment may include: ? Taking small doses of NSAIDs or colchicine daily. ? Using a medicine that reduces uric acid levels in your blood. ? Making changes to your diet. You may need to see a food expert (dietitian) about what to eat and drink to prevent gout. Follow these instructions at home: During a gout attack   If told, put ice on the painful area: ? Put ice in a plastic bag. ? Place a towel between your skin and the bag. ? Leave the ice on for 20 minutes, 2-3 times a day.  Raise (elevate) the painful joint above the level of your heart as often as you can.  Rest the joint as much as possible. If the joint is in your leg, you may be given crutches.  Follow instructions from your doctor about what you cannot eat or drink. Avoiding future gout attacks  Eat a low-purine diet. Avoid foods and drinks such as: ? Liver. ? Kidney. ? Anchovies. ? Asparagus. ? Herring. ? Mushrooms. ? Mussels. ? Beer.  Stay at a healthy weight. If you want to lose weight, talk with your doctor. Do not lose weight  weight too fast.  Start or continue an exercise plan as told by your doctor. Eating and drinking  Drink enough fluids to keep your pee (urine) pale yellow.  If you drink alcohol: ? Limit how much you use to:  0-1 drink a day for women.  0-2 drinks a day for men. ? Be aware of how much alcohol is in your drink. In the [place] one drink equals one 12 oz bottle of beer (355 mL), one 5 oz glass of wine (148 mL), or one 1 oz glass of hard liquor (44 mL). General instructions  Take over-the-counter and prescription medicines only as told by your doctor.  Do  not drive or use heavy machinery while taking prescription pain medicine.  Return to your normal activities as told by your doctor. Ask your doctor what activities are safe for you.  Keep all follow-up visits as told by your doctor. This is important. Contact a doctor if:  You have another gout attack.  You still have symptoms of a gout attack after 10 days of treatment.  You have problems (side effects) because of your medicines.  You have chills or a fever.  You have burning pain when you pee (urinate).  You have pain in your lower back or belly. Get help right away if:  You have very bad pain.  Your pain cannot be controlled.  You cannot pee. Summary  Gout is painful swelling of the joints.  The most common site of pain is the big toe, but it can affect other joints.  Medicines and avoiding some foods can help to prevent and treat gout attacks. This information is not intended to replace advice given to you by your health care provider. Make sure you discuss any questions you have with your health care provider. Document Released: 11/17/2007 Document Revised: 08/30/2017 Document Reviewed: 08/30/2017 Elsevier Patient Education  2020 Elsevier Inc.   Low-Purine Eating Plan A low-purine eating plan involves making food choices to limit your intake of purine. Purine is a kind of uric acid. Too much uric acid in your blood can cause certain conditions, such as gout and kidney stones. Eating a low-purine diet can help control these conditions. What are tips for following this plan? Reading food labels   Avoid foods with saturated or Trans fat.  Check the ingredient list of grains-based foods, such as bread and cereal, to make sure that they contain whole grains.  Check the ingredient list of sauces or soups to make sure they do not contain meat or fish.  When choosing soft drinks, check the ingredient list to make sure they do not contain high-fructose corn syrup.  Shopping  Buy plenty of fresh fruits and vegetables.  Avoid buying canned or fresh fish.  Buy dairy products labeled as low-fat or nonfat.  Avoid buying premade or processed foods. These foods are often high in fat, salt (sodium), and added sugar. Cooking  Use olive oil instead of butter when cooking. Oils like olive oil, canola oil, and sunflower oil contain healthy fats. Meal planning  Learn which foods do or do not affect you. If you find out that a food tends to cause your gout symptoms to flare up, avoid eating that food. You can enjoy foods that do not cause problems. If you have any questions about a food item, talk with your dietitian or health care provider.  Limit foods high in fat, especially saturated fat. Fat makes it harder for your body to get rid of   uric acid.  Choose foods that are lower in fat and are lean sources of protein. General guidelines  Limit alcohol intake to no more than 1 drink a day for nonpregnant women and 2 drinks a day for men. One drink equals 12 oz of beer, 5 oz of wine, or 1 oz of hard liquor. Alcohol can affect the way your body gets rid of uric acid.  Drink plenty of water to keep your urine clear or pale yellow. Fluids can help remove uric acid from your body.  If directed by your health care provider, take a vitamin C supplement.  Work with your health care provider and dietitian to develop a plan to achieve or maintain a healthy weight. Losing weight can help reduce uric acid in your blood. What foods are recommended? The items listed may not be a complete list. Talk with your dietitian about what dietary choices are best for you. Foods low in purines Foods low in purines do not need to be limited. These include:  All fruits.  All low-purine vegetables, pickles, and olives.  Breads, pasta, rice, cornbread, and popcorn. Cake and other baked goods.  All dairy foods.  Eggs, nuts, and nut butters.  Spices and condiments, such as  salt, herbs, and vinegar.  Plant oils, butter, and margarine.  Water, sugar-free soft drinks, tea, coffee, and cocoa.  Vegetable-based soups, broths, sauces, and gravies. Foods moderate in purines Foods moderate in purines should be limited to the amounts listed.   cup of asparagus, cauliflower, spinach, mushrooms, or green peas, each day.  2/3 cup uncooked oatmeal, each day.   cup dry wheat bran or wheat germ, each day.  2-3 ounces of meat or poultry, each day.  4-6 ounces of shellfish, such as crab, lobster, oysters, or shrimp, each day.  1 cup cooked beans, peas, or lentils, each day.  Soup, broths, or bouillon made from meat or fish. Limit these foods as much as possible. What foods are not recommended? The items listed may not be a complete list. Talk with your dietitian about what dietary choices are best for you. Limit your intake of foods high in purines, including:  Beer and other alcohol.  Meat-based gravy or sauce.  Canned or fresh fish, such as: ? Anchovies, sardines, herring, and tuna. ? Mussels and scallops. ? Codfish, trout, and haddock.  Bacon.  Organ meats, such as: ? Liver or kidney. ? Tripe. ? Sweetbreads (thymus gland or pancreas).  Wild game or goose.  Yeast or yeast extract supplements.  Drinks sweetened with high-fructose corn syrup. Summary  Eating a low-purine diet can help control conditions caused by too much uric acid in the body, such as gout or kidney stones.  Choose low-purine foods, limit alcohol, and limit foods high in fat.  You will learn over time which foods do or do not affect you. If you find out that a food tends to cause your gout symptoms to flare up, avoid eating that food. This information is not intended to replace advice given to you by your health care provider. Make sure you discuss any questions you have with your health care provider. Document Released: 06/04/2010 Document Revised: 01/20/2017 Document  Reviewed: 03/23/2016 Elsevier Patient Education  2020 Elsevier Inc.   

## 2018-12-31 NOTE — Telephone Encounter (Signed)
Pt calling to f/up on this request, and to reiterate that she is in considerable pain.  Please advise.

## 2018-12-31 NOTE — Progress Notes (Signed)
Virtual Visit via Video Note  I connected with Dana Mcmahon on 12/31/18 at  2:30 PM EST by a video enabled telemedicine application 2/2 [id number] pandemic and verified that I am speaking with the correct person using two identifiers.  Location patient: home Location provider:work or home office Persons participating in the virtual visit: patient, provider  I discussed the limitations of evaluation and management by telemedicine and the availability of in person appointments. The patient expressed understanding and agreed to proceed.   HPI: Pt is a 63 yo female with pmh sig for HTN, DM II, HLD, OA, TMJ, h/o anxiety, palpitations followed by Dr. Yong Channel.  Pt seen for acute concern by this provider d/t no openings at [facility] office.  Pt states she began having pain on Friday in L foot.  States feels swollen and painful, throbbing, cannot walk.  Denies injury, pain in calf or edema in calf.  Took ibuprofen.  Pt thinks eating tomato sauce and [place] may have caused the pain.  Pt endorses eating fish and soups.  Stopped eating organ meats and does not drink EtOH.  Pt does not have a formal dx of gout, but states she knows she has it.  Pt had similar pain in R foot around mother's day last yr, diclofenac po helped.  ROS: See pertinent positives and negatives per HPI.  Past Medical History:  Diagnosis Date  . Allergic rhinitis   . Anxiety   . Arthritis   . DM type 2 (diabetes mellitus, type 2) ([place])   . Heart murmur   . Hyperlipidemia    per old records, pt has refused cholesto-lowering meds  . Hypertension   . Neck pain, chronic    Mild DDD, no signif progression (multiple MRI's: 2001, 2003, 2005, 2007)  . Obesity, Class I, BMI 30-34.9   . 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 ARTERY BIOPSY Left 06/13/2017   Procedure: LEFT TEMPORAL ARTERY BIOPSY;  Surgeon: Johnathan Hausen, MD;  Location: [facility];  Service: General;  Laterality: Left;   . COLONOSCOPY    . none      Family History  Problem Relation Age of Onset  . Stroke Mother        62s, and father 57s  . Hypertension Mother        and father  . Stroke Brother        in 54s  . Colon cancer Neg Hx   . Esophageal cancer Neg Hx   . Rectal cancer Neg Hx   . Stomach cancer Neg Hx      Current Outpatient Medications:  .  Ascorbic Acid (VITAMIN C PO), Take 1 tablet by mouth daily., Disp: , Rfl:  .  BIOTIN PO, Take 1 tablet by mouth daily., Disp: , Rfl:  .  Cholecalciferol (VITAMIN D3 PO), Take 1 capsule by mouth daily., Disp: , Rfl:  .  diclofenac (VOLTAREN) 75 MG EC tablet, TAKE 1 TABLET BY MOUTH TWICE DAILY., Disp: 20 tablet, Rfl: 0 .  diphenhydrAMINE HCl, Sleep, (UNISOM SLEEPGELS) 50 MG CAPS, Take 50 mg by mouth at bedtime. , Disp: , Rfl:  .  fluticasone (FLONASE) 50 MCG/ACT nasal spray, Place 2 sprays into both nostrils daily. (Patient not taking: Reported on 07/02/2018), Disp: 16 g, Rfl: 5 .  glucose blood (ACCU-CHEK GUIDE) test strip, Check blood sugar three times daily, Disp: 100 each, Rfl: 11 .  hydrochlorothiazide (HYDRODIURIL) 25 MG tablet,  TAKE 1 TABLET BY MOUTH DAILY, Disp: 90 tablet, Rfl: 1 .  Lancets (ACCU-CHEK MULTICLIX) lancets, Check blood sugar three times daily, Disp: 100 each, Rfl: 11 .  metFORMIN (GLUCOPHAGE) 500 MG tablet, Take 1 tablet (500 mg total) by mouth 2 (two) times daily with a meal., Disp: 180 tablet, Rfl: 1 .  metoprolol succinate (TOPROL-XL) 25 MG 24 hr tablet, Take 0.5 tablets (12.5 mg total) by mouth daily. Please keep upcoming appt with Dr. Acie Fredrickson in November before anymore refills. Thank you, Disp: 45 tablet, Rfl: 0 .  Multiple Vitamin (MULTIVITAMIN WITH MINERALS) TABS tablet, Take 1 tablet by mouth daily., Disp: , Rfl:  .  POTASSIUM PO, Take 95 mg by mouth daily. , Disp: , Rfl:  .  rosuvastatin (CRESTOR) 5 MG tablet, Take 1 tablet (5 mg total) by mouth once a week., Disp: 13 tablet, Rfl: 3  EXAM:  VITALS per patient if  applicable:  RR between 12-20 bpm  GENERAL: alert, oriented, appears well and in no acute distress  HEENT: atraumatic, conjunctiva clear, no obvious abnormalities on inspection of external nose and ears  NECK: normal movements of the head and neck  LUNGS: on inspection no signs of respiratory distress, breathing rate appears normal, no obvious gross SOB, gasping or wheezing  CV: no obvious cyanosis  MS: L midfoot and ankle with edema.  No visible erythema or streaking.  Able to move toes of L foot, but hesitant 2/2 pain.  No calf tenderness.  PSYCH/NEURO: pleasant and cooperative, no obvious depression or anxiety, speech and thought processing grossly intact  ASSESSMENT AND PLAN:  Discussed the following assessment and plan:  Left foot pain  -likely 2/2 gout given symptoms and hx.  Attempted to have pt get labs today, however she declined 2/2 inability to walk. -Consider other possible causes including arthritis, fx, sprain, also consider cellulitis. -Reviewed list of foods high in purines. -given h/o DM will avoid prednisone.  Will treat with [place] po and ice Discussed possible S/Es. -pt strongly advised to f/u with pcp in 2 days.  Discussed need for labs including uric acid level and BMP.  Consider xray. - Plan: diclofenac (VOLTAREN) 75 MG EC tablet -given precautions  Acute left ankle pain  - Plan: diclofenac (VOLTAREN) 75 MG EC tablet  F/u with pcp in 2 days.   I discussed the assessment and treatment plan with the patient. The patient was provided an opportunity to ask questions and all were answered. The patient agreed with the plan and demonstrated an understanding of the instructions.   The patient was advised to call back or seek an in-person evaluation if the symptoms worsen or if the condition fails to improve as anticipated.   [name], MD

## 2019-01-02 ENCOUNTER — Ambulatory Visit (INDEPENDENT_AMBULATORY_CARE_PROVIDER_SITE_OTHER): Payer: Federal, State, Local not specified - PPO | Admitting: Family Medicine

## 2019-01-02 ENCOUNTER — Encounter: Payer: Self-pay | Admitting: Family Medicine

## 2019-01-02 ENCOUNTER — Other Ambulatory Visit: Payer: Self-pay

## 2019-01-02 VITALS — BP 150/86 | HR 62 | Temp 98.1°F | Ht 64.0 in | Wt 165.0 lb

## 2019-01-02 DIAGNOSIS — M25572 Pain in left ankle and joints of left foot: Secondary | ICD-10-CM

## 2019-01-02 DIAGNOSIS — Z79899 Other long term (current) drug therapy: Secondary | ICD-10-CM

## 2019-01-02 DIAGNOSIS — Z0000 Encounter for general adult medical examination without abnormal findings: Secondary | ICD-10-CM

## 2019-01-02 DIAGNOSIS — E1159 Type 2 diabetes mellitus with other circulatory complications: Secondary | ICD-10-CM

## 2019-01-02 DIAGNOSIS — E1169 Type 2 diabetes mellitus with other specified complication: Secondary | ICD-10-CM

## 2019-01-02 DIAGNOSIS — E785 Hyperlipidemia, unspecified: Secondary | ICD-10-CM

## 2019-01-02 DIAGNOSIS — I152 Hypertension secondary to endocrine disorders: Secondary | ICD-10-CM

## 2019-01-02 DIAGNOSIS — I10 Essential (primary) hypertension: Secondary | ICD-10-CM

## 2019-01-02 DIAGNOSIS — E119 Type 2 diabetes mellitus without complications: Secondary | ICD-10-CM | POA: Diagnosis not present

## 2019-01-02 DIAGNOSIS — M79672 Pain in left foot: Secondary | ICD-10-CM

## 2019-01-02 MED ORDER — DICLOFENAC SODIUM 75 MG PO TBEC: 75.0000 mg | DELAYED_RELEASE_TABLET | Freq: Two times a day (BID) | ORAL | 0 refills | Status: DC

## 2019-01-02 MED ORDER — AMLODIPINE BESYLATE 2.5 MG PO TABS: 2.5000 mg | ORAL_TABLET | Freq: Every day | ORAL | 5 refills | Status: DC

## 2019-01-02 NOTE — Patient Instructions (Addendum)
Health Maintenance Due  Topic Date Due  . FOOT EXAM - today noraml 06/28/2018  . HEMOGLOBIN A1C- when we do your physical  10/28/2018  . PAP SMEAR- seen by GYN will make app  11/30/2018  . OPHTHALMOLOGY EXAM  Has app in jan for eye exam  12/12/2018   Continue diclofenac at least until pain <1-2/10.    Hydrochlorothiazide may be contributing to gout flares. We are not 100% sure this is gout yet but it is likely either gout or pseudogout. We will know more when we get a uric acid level- Dana Mcmahon is going to reschedule physical and we will get uric acid level at that time.   Can schedule labs anytime between now and your physical but need to not be having a gout flare up for these labs to be accurate.

## 2019-01-02 NOTE — Progress Notes (Signed)
Phone [phone] In person visit   Subjective:   Dana Mcmahon is a 63 y.o. year old very pleasant female patient who presents for/with See problem oriented charting Chief Complaint  Patient presents with   Gout   ROS-no fever/chills/nausea/vomiting.  Did have redness around her left ankle but that has improved with diclofenac-swelling has also improved around the left ankle.  Left ankle pain also improving after diclofenac  Past Medical History-  Patient Active Problem List   Diagnosis Date Noted   Diabetes mellitus type II, controlled ([place]) 05/10/2013    Priority: High   Headache, unspecified headache type 06/03/2017    Priority: Medium   History of adenomatous polyp of colon 07/25/2016    Priority: Medium   Hyperlipidemia associated with type 2 diabetes mellitus ([place])     Priority: Medium   Hypertension associated with diabetes ([place]) 05/10/2013    Priority: Medium   Palpitations 05/10/2013    Priority: Medium   Osteoarthritis, hand 12/16/2014    Priority: Low   Allergic rhinitis 07/24/2014    Priority: Low   Obesity, Class I, BMI 30-34.9     Priority: Low   Dizzy spells 08/08/2013    Priority: Low   Upper airway cough syndrome 01/22/2016    Medications- reviewed and updated Current Outpatient Medications  Medication Sig Dispense Refill   Ascorbic Acid (VITAMIN C PO) Take 1 tablet by mouth daily.     BIOTIN PO Take 1 tablet by mouth daily.     Cholecalciferol (VITAMIN D3 PO) Take 1 capsule by mouth daily.     diclofenac (VOLTAREN) 75 MG EC tablet Take 1 tablet (75 mg total) by mouth 2 (two) times daily. 20 tablet 0   diphenhydrAMINE HCl, Sleep, (UNISOM SLEEPGELS) 50 MG CAPS Take 50 mg by mouth at bedtime.      fluticasone (FLONASE) 50 MCG/ACT nasal spray Place 2 sprays into both nostrils daily. 16 g 5   glucose blood (ACCU-CHEK GUIDE) test strip Check blood sugar three times daily 100 each 11   Lancets (ACCU-CHEK MULTICLIX) lancets  Check blood sugar three times daily 100 each 11   metFORMIN (GLUCOPHAGE) 500 MG tablet Take 1 tablet (500 mg total) by mouth 2 (two) times daily with a meal. 180 tablet 1   metoprolol succinate (TOPROL-XL) 25 MG 24 hr tablet Take 0.5 tablets (12.5 mg total) by mouth daily. Please keep upcoming appt with Dr. Acie Fredrickson in November before anymore refills. Thank you 45 tablet 0   Multiple Vitamin (MULTIVITAMIN WITH MINERALS) TABS tablet Take 1 tablet by mouth daily.     POTASSIUM PO Take 95 mg by mouth daily.      rosuvastatin (CRESTOR) 5 MG tablet Take 1 tablet (5 mg total) by mouth once a week. 13 tablet 3   amLODipine (NORVASC) 2.5 MG tablet Take 1 tablet (2.5 mg total) by mouth daily. 30 tablet 5   No current facility-administered medications for this visit.      Objective:  BP (!) 150/86    Pulse 62    Temp 98.1 F (36.7 C) (Temporal)    Ht 5\' 4"  (1.626 m)    Wt 165 lb (74.8 kg)    LMP  (LMP Unknown)    SpO2 97%    BMI 28.32 kg/m  Gen: NAD, resting comfortably CV: RRR no murmurs rubs or gallops Lungs: CTAB no crackles, wheeze, rhonchi Ext: no edema Skin: warm, dry MSK: Right ankle and foot normal-she states compared to baseline some hypertrophy at  MTP joint after prior possible gout flare.  Left toes normal.  Left ankle and foot slightly edematous.  Patient tender below and around the medial and lateral malleolus with some edema.  Mild warmth in this area without erythema.  Diabetic Foot Exam - Simple   Simple Foot Form Diabetic Foot exam was performed with the following findings: Yes 01/02/2019 10:03 AM  Visual Inspection No deformities, no ulcerations, no other skin breakdown bilaterally: Yes Sensation Testing Intact to touch and monofilament testing bilaterally: Yes Pulse Check Posterior Tibialis and Dorsalis pulse intact bilaterally: Yes Comments        Assessment and Plan   #Concern for gout with left ankle pain -patient seen by telemedicine visit on 12/31/2018 for left  foot pain S: Patient is concern for potential gout.   Flare up started last week. So far has only taken only 3 pills of diclofenac after visit with Dr. Volanda Napoleon but has already noted significant improvement.  Today, Pain in left ankle 5/10 at moment. Also hurts in upper portion of left leg over shin- feels like she has been walking differently trying to avoid putting pressure directly on the ankle.     A/P: I do think this has a high probability of being gout.  The fact she is on hydrochlorothiazide increases her risk-we agreed to stop hydrochlorothiazide-see hypertension discussion below.  Once acute flareup is done she will come back for blood work including uric acid before her physical in January.  I may go ahead and start allopurinol if uric acid level remains above 6 given she has now had 2 potential attacks this year. -Pseudogout also in differential -No fall or trauma-we deferred x-rays for now.  In May when she had a similar flareup on the right foot we had plan for x-rays but she did not come by for these. -Does have some mild left foot pain likely related to distention from swelling.  Also has some pain over her left shin likely related to walking differently with gout related pain.  Encouraged icing regimen -Refill diclofenac for her to have on hand   #Hyperlipidemia  S: Typically compliant with  rosuvastatin 5 mg once a week.  She admits to missing a few doses. A/P: I encouraged regular compliance with rosuvastatin once a week-update lipid panel with labs-we will need to factor in missed doses potentially  #Hypertension S: Poor control today despite being compliant with hctz 25mg  once a day as well as metoprolol 12.5mg  extended release.    Cough on ACE inhibitor in the past. A/P: Poor control today likely due to acute pain.  We will stop hydrochlorothiazide due to potentially causing gout.  Start amlodipine 2.5 mg and continue metoprolol.  Follow-up at physical in January-happy to see  her sooner if blood pressure does not trend down-she can monitor from home  #High risk medication use-patient is taking vitamin D but is unaware of baseline vitamin D levels-we will assess vitamin D with labs to make sure not too high  #Diabetes-hopefully controlled on current medications.  Patient agrees to come back for labs including A1c  #Also see AVS for health maintenance counseling  Recommended follow up: January physical planned Future Appointments  Date Time Provider [place]  01/21/2019  9:40 AM Nahser, Wonda Cheng, MD [facility]  03/05/2019 11:30 AM [facility]  03/12/2019  1:20 PM Marin Olp, MD [facility]   Lab/Order associations:   ICD-10-CM   1. Acute left ankle pain  M25.572 Uric  Acid  2. Hypertension associated with diabetes ([place])  E11.59    I10   3. Hyperlipidemia associated with type 2 diabetes mellitus ([place])  E11.69    E78.5   4. Controlled type 2 diabetes mellitus without complication, without long-term current use of insulin (HCC)  E11.9 CBC with Differential future    CMET future    Lipid future    A1c future  5. High risk medication use  Z79.899 Vitamin D  (25 hydroxy )  6. Left foot pain  M79.672 Uric Acid  7. Preventative health care  Z00.00 CBC with Differential future    CMET future    Lipid future    Uric Acid    A1c future    Vitamin D  (25 hydroxy )    Meds ordered this encounter  Medications   diclofenac (VOLTAREN) 75 MG EC tablet    Sig: Take 1 tablet (75 mg total) by mouth 2 (two) times daily.    Dispense:  20 tablet    Refill:  0   amLODipine (NORVASC) 2.5 MG tablet    Sig: Take 1 tablet (2.5 mg total) by mouth daily.    Dispense:  30 tablet    Refill:  5    Return precautions advised.  Garret Reddish, MD

## 2019-01-02 NOTE — Assessment & Plan Note (Signed)
S: Poor control today despite being compliant with hctz 25mg  once a day as well as metoprolol 12.5mg  extended release.    Cough on ACE inhibitor in the past. A/P: Poor control today likely due to acute pain.  We will stop hydrochlorothiazide due to potentially causing gout.  Start amlodipine 2.5 mg and continue metoprolol.  Follow-up at physical in January-happy to see her sooner if blood pressure does not trend down

## 2019-01-02 NOTE — Progress Notes (Deleted)
Phone [phone] In person visit   Subjective:   Dana Mcmahon is a 62 y.o. year old very pleasant female patient who presents for/with See problem oriented charting No chief complaint on file.   ROS- ***   Past Medical History-  Patient Active Problem List   Diagnosis Date Noted  . Headache, unspecified headache type 06/03/2017  . History of adenomatous polyp of colon 07/25/2016  . Upper airway cough syndrome 01/22/2016  . Osteoarthritis, hand 12/16/2014  . Allergic rhinitis 07/24/2014  . Hyperlipidemia associated with type 2 diabetes mellitus ([place])   . Obesity, Class I, BMI 30-34.9   . Dizzy spells 08/08/2013  . Diabetes mellitus type II, controlled ([place]) 05/10/2013  . Hypertension associated with diabetes ([place]) 05/10/2013  . Palpitations 05/10/2013    Medications- reviewed and updated Current Outpatient Medications  Medication Sig Dispense Refill  . Ascorbic Acid (VITAMIN C PO) Take 1 tablet by mouth daily.    Marland Kitchen BIOTIN PO Take 1 tablet by mouth daily.    . Cholecalciferol (VITAMIN D3 PO) Take 1 capsule by mouth daily.    . diclofenac (VOLTAREN) 75 MG EC tablet Take 1 tablet (75 mg total) by mouth 2 (two) times daily. 20 tablet 0  . diphenhydrAMINE HCl, Sleep, (UNISOM SLEEPGELS) 50 MG CAPS Take 50 mg by mouth at bedtime.     . fluticasone (FLONASE) 50 MCG/ACT nasal spray Place 2 sprays into both nostrils daily. (Patient not taking: Reported on 07/02/2018) 16 g 5  . glucose blood (ACCU-CHEK GUIDE) test strip Check blood sugar three times daily 100 each 11  . hydrochlorothiazide (HYDRODIURIL) 25 MG tablet TAKE 1 TABLET BY MOUTH DAILY 90 tablet 1  . Lancets (ACCU-CHEK MULTICLIX) lancets Check blood sugar three times daily 100 each 11  . metFORMIN (GLUCOPHAGE) 500 MG tablet Take 1 tablet (500 mg total) by mouth 2 (two) times daily with a meal. 180 tablet 1  . metoprolol succinate (TOPROL-XL) 25 MG 24 hr tablet Take 0.5 tablets (12.5 mg total) by mouth daily. Please  keep upcoming appt with Dr. Acie Fredrickson in November before anymore refills. Thank you 45 tablet 0  . Multiple Vitamin (MULTIVITAMIN WITH MINERALS) TABS tablet Take 1 tablet by mouth daily.    Marland Kitchen POTASSIUM PO Take 95 mg by mouth daily.     . rosuvastatin (CRESTOR) 5 MG tablet Take 1 tablet (5 mg total) by mouth once a week. 13 tablet 3   No current facility-administered medications for this visit.      Objective:  LMP  (LMP Unknown)  Gen: NAD, resting comfortably CV: RRR no murmurs rubs or gallops Lungs: CTAB no crackles, wheeze, rhonchi Abdomen: soft/nontender/nondistended/normal bowel sounds. No rebound or guarding.  Ext: no edema Skin: warm, dry Neuro: grossly normal, moves all extremities  ***    Assessment and Plan  *** # Diabetes S: compliant with metformin 500mg  once daily. Was having diarrhea on higher doses. Possibly could add [place] if needed if gets high and cannot control with diet/exercise.  A/P:***   Hyperlipidemia  S: Compliant with *** rosuvastatin 5 mg once a week.   A/P: ***   Hypertension S:compliant with hctz 12.5mg  once a day as well as metoprolol 12.5mg  extended release.   A/P:  ***   Headache, unspecified headache type S: still getting some stress headaches mainly on the left side. advil or tylenol helps.  Had negative temporal artery biopsy a few years ago.  No vision loss reported A/P: ***   No problem-specific Assessment &  Plan notes found for this encounter.   Recommended follow up: *** Future Appointments  Date Time Provider [place]  01/02/2019  9:20 AM Marin Olp, MD [facility]  01/21/2019  9:40 AM Nahser, Wonda Cheng, MD [facility]    Lab/Order associations:   ICD-10-CM   1. Controlled type 2 diabetes mellitus without complication, without long-term current use of insulin (HCC)  E11.9   2. Hyperlipidemia associated with type 2 diabetes mellitus ([place])  E11.69    E78.5     No orders of the defined types were  placed in this encounter.   Return precautions advised.  Francella Solian, CMA

## 2019-01-21 ENCOUNTER — Ambulatory Visit: Payer: Federal, State, Local not specified - PPO | Admitting: Cardiovascular Disease

## 2019-01-23 ENCOUNTER — Other Ambulatory Visit: Payer: Self-pay | Admitting: Family Medicine

## 2019-02-05 ENCOUNTER — Other Ambulatory Visit: Payer: Self-pay | Admitting: Family Medicine

## 2019-02-11 NOTE — Telephone Encounter (Signed)
Given on 12/2 #20 take 1/2 tab bid ok to refill?

## 2019-02-11 NOTE — Telephone Encounter (Signed)
We need to have her complete labs ordered in November before refilling this

## 2019-02-14 ENCOUNTER — Other Ambulatory Visit: Payer: Self-pay | Admitting: Family Medicine

## 2019-02-20 ENCOUNTER — Other Ambulatory Visit: Payer: Self-pay | Admitting: Family Medicine

## 2019-03-05 ENCOUNTER — Other Ambulatory Visit: Payer: Federal, State, Local not specified - PPO

## 2019-03-08 ENCOUNTER — Ambulatory Visit: Payer: Federal, State, Local not specified - PPO | Admitting: Cardiovascular Disease

## 2019-03-08 ENCOUNTER — Encounter: Payer: Self-pay | Admitting: Cardiovascular Disease

## 2019-03-08 ENCOUNTER — Other Ambulatory Visit: Payer: Self-pay

## 2019-03-08 VITALS — BP 140/78 | HR 71 | Ht 63.5 in | Wt 169.0 lb

## 2019-03-08 DIAGNOSIS — I493 Ventricular premature depolarization: Secondary | ICD-10-CM

## 2019-03-08 DIAGNOSIS — E785 Hyperlipidemia, unspecified: Secondary | ICD-10-CM

## 2019-03-08 DIAGNOSIS — E1159 Type 2 diabetes mellitus with other circulatory complications: Secondary | ICD-10-CM

## 2019-03-08 DIAGNOSIS — I10 Essential (primary) hypertension: Secondary | ICD-10-CM | POA: Diagnosis not present

## 2019-03-08 DIAGNOSIS — I491 Atrial premature depolarization: Secondary | ICD-10-CM | POA: Diagnosis not present

## 2019-03-08 DIAGNOSIS — I152 Hypertension secondary to endocrine disorders: Secondary | ICD-10-CM

## 2019-03-08 MED ORDER — METOPROLOL SUCCINATE ER 25 MG PO TB24: 12.5000 mg | ORAL_TABLET | Freq: Every day | ORAL | 3 refills | Status: DC

## 2019-03-08 NOTE — Progress Notes (Signed)
Cardiology Office Note:    Date:  03/08/2019   ID:  Azirah, Perkowski [birth year], MRN [id number]  PCP:  Marin Olp, MD  Cardiologist:  Marilene Vath   Problem List 1. Palpitations  2.  Diabetes mellitus 3.  Hyperlipidemia   Referring MD: Marin Olp, MD   Chief Complaint  Patient presents with  . Palpitations    History of Present Illness:    Dana Mcmahon is a 64 y.o. female with a hx of palpitations. Feels her Heart racing Associated with chest heaviness Last for several minutes, Different times of the day .   Has woken her up from sleep on occasion Exercises reguarly - walks on the treadmill - ,  No issues while working out, she occasionally symptoms right after working out.  These are associated with some mild lightheadedness.  Similar palpitations in 2016 - Holter showed PACs and PVCs   August 07, 2017:  Dana Mcmahon is seen today for follow up of her PVCs Tried toprol XL.  Has lots of fatigue.   No energy to do much  Not exercisig     March 08, 2019:  Dana Mcmahon is seen today for follow-up of her prior palpitations.  She is had a Holter monitor that showed premature atrial contractions and premature ventricular contractions.  I saw her a year ago.  At that time we had her on low-dose Toprol-XL which caused some fatigue.    Past Medical History:  Diagnosis Date  . Allergic rhinitis   . Anxiety   . Arthritis   . DM type 2 (diabetes mellitus, type 2) ([place])   . Heart murmur   . Hyperlipidemia    per old records, pt has refused cholesto-lowering meds  . Hypertension   . Neck pain, chronic    Mild DDD, no signif progression (multiple MRI's: 2001, 2003, 2005, 2007)  . Obesity, Class I, BMI 30-34.9   . 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 ARTERY BIOPSY Left 06/13/2017   Procedure: LEFT TEMPORAL ARTERY BIOPSY;  Surgeon: Johnathan Hausen, MD;  Location: [facility];  Service: General;  Laterality:  Left;  . COLONOSCOPY    . none      Current Medications: Current Meds  Medication Sig  . amLODipine (NORVASC) 2.5 MG tablet Take 1 tablet (2.5 mg total) by mouth daily.  . Ascorbic Acid (VITAMIN C PO) Take 1 tablet by mouth daily.  Marland Kitchen BIOTIN PO Take 1 tablet by mouth daily.  . Cholecalciferol (VITAMIN D3 PO) Take 1 capsule by mouth daily.  . diclofenac (VOLTAREN) 75 MG EC tablet TAKE 1 TABLET(75 MG) BY MOUTH TWICE DAILY  . diphenhydrAMINE HCl, Sleep, (UNISOM SLEEPGELS) 50 MG CAPS Take 50 mg by mouth at bedtime.   Marland Kitchen glucose blood (ACCU-CHEK GUIDE) test strip Check blood sugar three times daily  . Lancets (ACCU-CHEK MULTICLIX) lancets Check blood sugar three times daily  . metFORMIN (GLUCOPHAGE) 500 MG tablet Take 1 tablet (500 mg total) by mouth 2 (two) times daily with a meal.  . metoprolol succinate (TOPROL-XL) 25 MG 24 hr tablet Take 0.5 tablets (12.5 mg total) by mouth daily.  . Multiple Vitamin (MULTIVITAMIN WITH MINERALS) TABS tablet Take 1 tablet by mouth daily.  . rosuvastatin (CRESTOR) 5 MG tablet Take 1 tablet (5 mg total) by mouth once a week.  . [DISCONTINUED] metoprolol succinate (TOPROL-XL) 25 MG 24 hr tablet Take 0.5 tablets (12.5 mg total) by  mouth daily. Please keep upcoming appt with Dr. Acie Fredrickson in November before anymore refills. Thank you     Allergies:   Codeine, Penicillins, and Latex   Social History   Socioeconomic History  . Marital status: Married    Spouse name: Not on file  . Number of children: Not on file  . Years of education: Not on file  . Highest education level: Not on file  Occupational History  . Not on file  Tobacco Use  . Smoking status: Never Smoker  . Smokeless tobacco: Never Used  Substance and Sexual Activity  . Alcohol use: No  . Drug use: No  . Sexual activity: Not on file  Other Topics Concern  . Not on file  Social History Narrative   Married, 4 children (daughter Dana Mcmahon patient here, husband Dana Mcmahon, son Dana Mcmahon). 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      Hobbies: time with family, watching news, reading            Social Determinants of Health   Financial Resource Strain:   . Difficulty of Paying Living Expenses: Not on file  Food Insecurity:   . Worried About Charity fundraiser in the Last Year: Not on file  . Ran Out of Food in the Last Year: Not on file  Transportation Needs:   . Lack of Transportation (Medical): Not on file  . Lack of Transportation (Non-Medical): Not on file  Physical Activity:   . Days of Exercise per Week: Not on file  . Minutes of Exercise per Session: Not on file  Stress:   . Feeling of Stress : Not on file  Social Connections:   . Frequency of Communication with Friends and Family: Not on file  . Frequency of Social Gatherings with Friends and Family: Not on file  . Attends Religious Services: Not on file  . Active Member of Clubs or Organizations: Not on file  . Attends Archivist Meetings: Not on file  . Marital Status: Not on file     Family History: The patient's family history includes Hypertension in her mother; Stroke in her brother and mother. There is no history of Colon cancer, Esophageal cancer, Rectal cancer, or Stomach cancer.  ROS:   Please see the history of present illness.     All other systems reviewed and are negative.  EKGs/Labs/Other Studies Reviewed:    The following studies were reviewed today:     Recent Labs: No results found for requested labs within last 8760 hours.  Recent Lipid Panel    Component Value Date/Time   CHOL 248 (H) 09/11/2015 0859   TRIG 308.0 (H) 09/11/2015 0859   HDL 47.90 09/11/2015 0859   CHOLHDL 5 09/11/2015 0859   VLDL 61.6 (H) 09/11/2015 0859   LDLDIRECT 112.0 09/06/2016 1718    Physical Exam:    Physical Exam: Blood pressure 140/78, pulse 71, height 5' 3.5" (1.613 m), weight 169 lb (76.7 kg), SpO2 98  %.  GEN:  Well nourished, well developed in no acute distress HEENT: Normal NECK: No JVD; No carotid bruits LYMPHATICS: No lymphadenopathy CARDIAC: RRR , no murmurs, rubs, gallops RESPIRATORY:  Clear to auscultation without rales, wheezing or rhonchi  ABDOMEN: Soft, non-tender, non-distended MUSCULOSKELETAL:  No edema; No deformity  SKIN: Warm and dry NEUROLOGIC:  Alert and oriented x 3  EKG: March 08, 2019:  Normal sinus rhythm at 71.  Nonspecific ST and T wave abnormalities.  ASSESSMENT:    1. PVC (premature ventricular contraction)   2. PAC (premature atrial contraction)   3. Hyperlipidemia, unspecified hyperlipidemia type   4. Essential hypertension    PLAN:    In order of problems listed above:   1.  Palpitations -she continues to have rare palpitations.  These are likely premature ventricular contractions.  We will continue low-dose Toprol-XL.  We will refill the medicine today.  2.  Hyperlipidemia.  This is been managed by Dr. Yong Channel.  Continue rosuvastatin.  3.  HTn:  BP is ok .  Cont current meds.   Advised her to exercise ,  Watch her salt   Medication Adjustments/Labs and Tests Ordered: Current medicines are reviewed at length with the patient today.  Concerns regarding medicines are outlined above.  Orders Placed This Encounter  Procedures  . EKG 12-Lead   Meds ordered this encounter  Medications  . metoprolol succinate (TOPROL-XL) 25 MG 24 hr tablet    Sig: Take 0.5 tablets (12.5 mg total) by mouth daily.    Dispense:  45 tablet    Refill:  3    Signed, Mertie Moores, MD  03/08/2019 1:50 PM    [facility]

## 2019-03-08 NOTE — Patient Instructions (Signed)

## 2019-03-11 ENCOUNTER — Other Ambulatory Visit: Payer: Federal, State, Local not specified - PPO

## 2019-03-12 ENCOUNTER — Encounter: Payer: Federal, State, Local not specified - PPO | Admitting: Family Medicine

## 2019-03-13 ENCOUNTER — Other Ambulatory Visit: Payer: Self-pay | Admitting: Cardiovascular Disease

## 2019-03-13 ENCOUNTER — Other Ambulatory Visit: Payer: Self-pay | Admitting: Family Medicine

## 2019-03-13 MED ORDER — METOPROLOL SUCCINATE ER 25 MG PO TB24: 12.5000 mg | ORAL_TABLET | Freq: Every day | ORAL | 3 refills | Status: DC

## 2019-04-08 ENCOUNTER — Other Ambulatory Visit: Payer: Self-pay

## 2019-04-08 ENCOUNTER — Other Ambulatory Visit (INDEPENDENT_AMBULATORY_CARE_PROVIDER_SITE_OTHER): Payer: Federal, State, Local not specified - PPO

## 2019-04-08 DIAGNOSIS — E119 Type 2 diabetes mellitus without complications: Secondary | ICD-10-CM | POA: Diagnosis not present

## 2019-04-08 DIAGNOSIS — Z0000 Encounter for general adult medical examination without abnormal findings: Secondary | ICD-10-CM | POA: Diagnosis not present

## 2019-04-08 DIAGNOSIS — Z79899 Other long term (current) drug therapy: Secondary | ICD-10-CM

## 2019-04-08 DIAGNOSIS — M25572 Pain in left ankle and joints of left foot: Secondary | ICD-10-CM

## 2019-04-08 DIAGNOSIS — M79672 Pain in left foot: Secondary | ICD-10-CM | POA: Diagnosis not present

## 2019-04-08 LAB — CBC WITH DIFFERENTIAL/PLATELET
Basophils Absolute: 0.1 10*3/uL (ref 0.0–0.1)
Basophils Relative: 2.1 % (ref 0.0–3.0)
Eosinophils Absolute: 0.2 10*3/uL (ref 0.0–0.7)
Eosinophils Relative: 3.8 % (ref 0.0–5.0)
HCT: 42.1 % (ref 36.0–46.0)
Hemoglobin: 14 g/dL (ref 12.0–15.0)
Lymphocytes Relative: 39.7 % (ref 12.0–46.0)
Lymphs Abs: 2 10*3/uL (ref 0.7–4.0)
MCHC: 33.3 g/dL (ref 30.0–36.0)
MCV: 86.3 fl (ref 78.0–100.0)
Monocytes Absolute: 0.4 10*3/uL (ref 0.1–1.0)
Monocytes Relative: 7.1 % (ref 3.0–12.0)
Neutro Abs: 2.4 10*3/uL (ref 1.4–7.7)
Neutrophils Relative %: 47.3 % (ref 43.0–77.0)
Platelets: 304 10*3/uL (ref 150.0–400.0)
RBC: 4.88 Mil/uL (ref 3.87–5.11)
RDW: 14.2 % (ref 11.5–15.5)
WBC: 5 10*3/uL (ref 4.0–10.5)

## 2019-04-08 LAB — COMPREHENSIVE METABOLIC PANEL
ALT: 18 U/L (ref 0–35)
AST: 15 U/L (ref 0–37)
Albumin: 4.2 g/dL (ref 3.5–5.2)
Alkaline Phosphatase: 90 U/L (ref 39–117)
BUN: 12 mg/dL (ref 6–23)
CO2: 25 mEq/L (ref 19–32)
Calcium: 9.3 mg/dL (ref 8.4–10.5)
Chloride: 102 mEq/L (ref 96–112)
Creatinine, Ser: 0.73 mg/dL (ref 0.40–1.20)
GFR: 80.2 mL/min (ref >60.00)
Glucose, Bld: 126 mg/dL — ABNORMAL HIGH (ref 70–99)
Potassium: 3.9 mEq/L (ref 3.5–5.1)
Sodium: 138 mEq/L (ref 135–145)
Total Bilirubin: 0.4 mg/dL (ref 0.2–1.2)
Total Protein: 7 g/dL (ref 6.0–8.3)

## 2019-04-08 LAB — LIPID PANEL
Cholesterol: 230 mg/dL — ABNORMAL HIGH (ref 0–200)
HDL: 45.4 mg/dL (ref >39.00)
NonHDL: 184.5
Total CHOL/HDL Ratio: 5
Triglycerides: 363 mg/dL — ABNORMAL HIGH (ref 0.0–149.0)
VLDL: 72.6 mg/dL — ABNORMAL HIGH (ref 0.0–40.0)

## 2019-04-08 LAB — HEMOGLOBIN A1C: Hgb A1c MFr Bld: 7.6 % — ABNORMAL HIGH (ref 4.6–6.5)

## 2019-04-08 LAB — VITAMIN D 25 HYDROXY (VIT D DEFICIENCY, FRACTURES): VITD: 42.79 ng/mL (ref 30.00–100.00)

## 2019-04-08 LAB — LDL CHOLESTEROL, DIRECT: Direct LDL: 110 mg/dL

## 2019-04-08 LAB — URIC ACID: Uric Acid, Serum: 6.5 mg/dL (ref 2.4–7.0)

## 2019-04-11 ENCOUNTER — Encounter: Payer: Federal, State, Local not specified - PPO | Admitting: Family Medicine

## 2019-04-22 ENCOUNTER — Ambulatory Visit (INDEPENDENT_AMBULATORY_CARE_PROVIDER_SITE_OTHER): Payer: Federal, State, Local not specified - PPO | Admitting: Family Medicine

## 2019-04-22 ENCOUNTER — Encounter: Payer: Self-pay | Admitting: Family Medicine

## 2019-04-22 ENCOUNTER — Other Ambulatory Visit: Payer: Self-pay

## 2019-04-22 VITALS — BP 138/88 | HR 69 | Temp 98.1°F | Ht 63.5 in | Wt 169.8 lb

## 2019-04-22 DIAGNOSIS — L299 Pruritus, unspecified: Secondary | ICD-10-CM | POA: Diagnosis not present

## 2019-04-22 DIAGNOSIS — R519 Headache, unspecified: Secondary | ICD-10-CM | POA: Diagnosis not present

## 2019-04-22 DIAGNOSIS — Z0000 Encounter for general adult medical examination without abnormal findings: Secondary | ICD-10-CM | POA: Diagnosis not present

## 2019-04-22 MED ORDER — ROSUVASTATIN CALCIUM 5 MG PO TABS: 5.0000 mg | ORAL_TABLET | ORAL | 3 refills | Status: DC

## 2019-04-22 NOTE — Assessment & Plan Note (Signed)
S: Patient reports ongoing issues with what she believes to be stress/poor sleep related headaches on the left side of her head.Had negative temporal artery biopsy a few years ago.  No vision loss reported.Now more in left scalp. Worse issues when not sleeping well. Trying unisom.  still getting some stress headaches mainly on the left side. advil or tylenol helps.    No show neurology 10/09/2017 as she reported resolution in headaches. She states since that time has had On and off headaches- very light headache.  A/P:  Patient feels like headache issues are related to poor sleep.  Unisom has not been as helpful as she would like.  We discussed possible trial of trazodone but she would like to think this over first.  At the end of the visit patient asks me if I can read complete FMLA which was completed in 2019-this was during the time of work-up for the headaches.  I asked patient how frequently she is affected by headaches at work and she states anywhere from once a week to once a month-she would like to have the ability to rest when the headaches occur.  I recommended we try the trazodone first and if she continues to have issues to get brain MRI without contrast due to new headache over age 13 as well as have her back in to discuss FMLA paperwork  After visit I reviewed prior work-up and she did have CT angio of the head and neck so I do feel better about neuro imaging already at least being completed but we can discuss whether we need to proceed forward with MRI with ongoing headaches at follow-up or refer her back to neurology for this discussion since MRIs could be expensive

## 2019-04-22 NOTE — Patient Instructions (Addendum)
Health Maintenance Due  Topic Date Due  . PAP SMEAR-needs to schedule- call Dr. Talbert Nan to schedule 11/30/2018  . OPHTHALMOLOGY EXAM -04/24/19 will have eye doc send a copy of records 12/12/2018  . MAMMOGRAM -needs to schedule- please call breast center back 01/27/2019   a1c above goal at 7.6. She wants to restart the 2nd dose of the day so she will take 500mg  in the morning and we will start with just a half tablet before dinner for a week and if she tolerates that without diarrhea can take a full pill. If she gets diarrhea with this she will contact me and we can consider alternates. Januvia would be a reasonable alternate   poor control on last labs for cholesterol- restart very low dose  Rosuvastatin 5 mg once a week while also working on diet and exercise  High normal blood pressure today- instead of increasing medicine she is going to continue amlodipine and metoprolol but also work on weight loss, low salt diet, healthy eating, regular exercise- exercise has been down with colder weather   Think/read about trazodone 50 mg tablets- you would start out with just half tablet. I would say if you continue to have left sided headaches even with sleeping better we should get MRI brain- you wanted to hold off for now.   We will call you within two weeks about your referral to dermatology. If you do not hear within 3 weeks, give [place] a call.     Trazodone tablets What is this medicine? TRAZODONE (TRAZ oh done) is used to treat depression. This medicine may be used for other purposes; ask your health care provider or pharmacist if you have questions. COMMON BRAND NAME(S): Desyrel What should I tell my health care provider before I take this medicine? They need to know if you have any of these conditions:  attempted suicide or thinking about it  bipolar disorder  bleeding problems  glaucoma  heart disease, or previous heart attack  irregular heart beat  kidney or liver disease  low  levels of sodium in the blood  an unusual or allergic reaction to trazodone, other medicines, foods, dyes or preservatives  pregnant or trying to get pregnant  breast-feeding How should I use this medicine? Take this medicine by mouth with a glass of water. Follow the directions on the prescription label. Take this medicine shortly after a meal or a light snack. Take your medicine at regular intervals. Do not take your medicine more often than directed. Do not stop taking this medicine suddenly except upon the advice of your doctor. Stopping this medicine too quickly may cause serious side effects or your condition may worsen. A special MedGuide will be given to you by the pharmacist with each prescription and refill. Be sure to read this information carefully each time. Talk to your pediatrician regarding the use of this medicine in children. Special care may be needed. Overdosage: If you think you have taken too much of this medicine contact a poison control center or emergency room at once. NOTE: This medicine is only for you. Do not share this medicine with others. What if I miss a dose? If you miss a dose, take it as soon as you can. If it is almost time for your next dose, take only that dose. Do not take double or extra doses. What may interact with this medicine? Do not take this medicine with any of the following medications:  certain medicines for fungal infections like fluconazole,  itraconazole, ketoconazole, posaconazole, voriconazole  cisapride  dronedarone  linezolid  MAOIs like Carbex, Eldepryl, Marplan, Nardil, and Parnate  mesoridazine  methylene blue (injected into a vein)  pimozide  saquinavir  thioridazine This medicine may also interact with the following medications:  alcohol  antiviral medicines for HIV or AIDS  aspirin and aspirin-like medicines  barbiturates like phenobarbital  certain medicines for blood pressure, heart disease, irregular heart  beat  certain medicines for depression, anxiety, or psychotic disturbances  certain medicines for migraine headache like almotriptan, eletriptan, frovatriptan, naratriptan, rizatriptan, sumatriptan, zolmitriptan  certain medicines for seizures like carbamazepine and phenytoin  certain medicines for sleep  certain medicines that treat or prevent blood clots like dalteparin, enoxaparin, warfarin  digoxin  fentanyl  lithium  NSAIDS, medicines for pain and inflammation, like ibuprofen or naproxen  other medicines that prolong the QT interval (cause an abnormal heart rhythm) like dofetilide  rasagiline  supplements like St. John's wort, kava kava, valerian  tramadol  tryptophan This list may not describe all possible interactions. Give your health care provider a list of all the medicines, herbs, non-prescription drugs, or dietary supplements you use. Also tell them if you smoke, drink alcohol, or use illegal drugs. Some items may interact with your medicine. What should I watch for while using this medicine? Tell your doctor if your symptoms do not get better or if they get worse. Visit your doctor or health care professional for regular checks on your progress. Because it may take several weeks to see the full effects of this medicine, it is important to continue your treatment as prescribed by your doctor. Patients and their families should watch out for new or worsening thoughts of suicide or depression. Also watch out for sudden changes in feelings such as feeling anxious, agitated, panicky, irritable, hostile, aggressive, impulsive, severely restless, overly excited and hyperactive, or not being able to sleep. If this happens, especially at the beginning of treatment or after a change in dose, call your health care professional. Dennis Bast may get drowsy or dizzy. Do not drive, use machinery, or do anything that needs mental alertness until you know how this medicine affects you. Do not  stand or sit up quickly, especially if you are an older patient. This reduces the risk of dizzy or fainting spells. Alcohol may interfere with the effect of this medicine. Avoid alcoholic drinks. This medicine may cause dry eyes and blurred vision. If you wear contact lenses you may feel some discomfort. Lubricating drops may help. See your eye doctor if the problem does not go away or is severe. Your mouth may get dry. Chewing sugarless gum, sucking hard candy and drinking plenty of water may help. Contact your doctor if the problem does not go away or is severe. What side effects may I notice from receiving this medicine? Side effects that you should report to your doctor or health care professional as soon as possible:  allergic reactions like skin rash, itching or hives, swelling of the face, lips, or tongue  elevated mood, decreased need for sleep, racing thoughts, impulsive behavior  confusion  fast, irregular heartbeat  feeling faint or lightheaded, falls  feeling agitated, angry, or irritable  loss of balance or coordination  painful or prolonged erections  restlessness, pacing, inability to keep still  suicidal thoughts or other mood changes  tremors  trouble sleeping  seizures  unusual bleeding or bruising Side effects that usually do not require medical attention (report to your doctor or health care  professional if they continue or are bothersome):  change in sex drive or performance  change in appetite or weight  constipation  headache  muscle aches or pains  nausea This list may not describe all possible side effects. Call your doctor for medical advice about side effects. You may report side effects to FDA at 1-800-FDA-1088. Where should I keep my medicine? Keep out of the reach of children. Store at room temperature between 15 and 30 degrees C (59 to 86 degrees F). Protect from light. Keep container tightly closed. Throw away any unused medicine after  the expiration date. NOTE: This sheet is a summary. It may not cover all possible information. If you have questions about this medicine, talk to your doctor, pharmacist, or health care provider.  2020 Elsevier/Gold Standard (2018-01-30 11:46:46)

## 2019-04-22 NOTE — Progress Notes (Signed)
Phone: [phone]   Subjective:  Patient presents today for their annual physical. Chief complaint-noted.   See problem oriented charting- ROS- full  review of systems was completed and negative except for: sinus pressure at times, seasonal allergies, palpitations (has seen cards), mild lighththeaded, weakness, sleep disturbance  The following were reviewed and entered/updated in epic: Past Medical History:  Diagnosis Date  . Allergic rhinitis   . Anxiety   . Arthritis   . DM type 2 (diabetes mellitus, type 2) ([place])   . Heart murmur   . Hyperlipidemia    per old records, pt has refused cholesto-lowering meds  . Hypertension   . Neck pain, chronic    Mild DDD, no signif progression (multiple MRI's: 2001, 2003, 2005, 2007)  . Obesity, Class I, BMI 30-34.9   . 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 Diabetes mellitus type II, controlled ([place]) 05/10/2013    Priority: High  . Headache, unspecified headache type 06/03/2017    Priority: Medium  . History of adenomatous polyp of colon 07/25/2016    Priority: Medium  . Hyperlipidemia associated with type 2 diabetes mellitus ([place])     Priority: Medium  . Hypertension associated with diabetes ([place]) 05/10/2013    Priority: Medium  . Palpitations 05/10/2013    Priority: Medium  . Osteoarthritis, hand 12/16/2014    Priority: Low  . Allergic rhinitis 07/24/2014    Priority: Low  . Obesity, Class I, BMI 30-34.9     Priority: Low  . Dizzy spells 08/08/2013    Priority: Low  . Upper airway cough syndrome 01/22/2016   Past Surgical History:  Procedure Laterality Date  . ARTERY BIOPSY Left 06/13/2017   Procedure: LEFT TEMPORAL ARTERY BIOPSY;  Surgeon: Johnathan Hausen, MD;  Location: [facility];  Service: General;  Laterality: Left;  . COLONOSCOPY    . none      Family History  Problem Relation Age of Onset  . Stroke Mother        52s, and father 44s  . Hypertension Mother          and father  . Stroke Brother        in 69s  . Colon cancer Neg Hx   . Esophageal cancer Neg Hx   . Rectal cancer Neg Hx   . Stomach cancer Neg Hx     Medications- reviewed and updated Current Outpatient Medications  Medication Sig Dispense Refill  . amLODipine (NORVASC) 2.5 MG tablet Take 1 tablet (2.5 mg total) by mouth daily. 30 tablet 5  . Ascorbic Acid (VITAMIN C PO) Take 1 tablet by mouth daily.    Marland Kitchen BIOTIN PO Take 1 tablet by mouth daily.    . Cholecalciferol (VITAMIN D3 PO) Take 1 capsule by mouth daily.    . diclofenac (VOLTAREN) 75 MG EC tablet TAKE 1 TABLET(75 MG) BY MOUTH TWICE DAILY 20 tablet 0  . diphenhydrAMINE HCl, Sleep, (UNISOM SLEEPGELS) 50 MG CAPS Take 50 mg by mouth at bedtime.     Marland Kitchen glucose blood (ACCU-CHEK GUIDE) test strip Check blood sugar three times daily 100 each 11  . Lancets (ACCU-CHEK MULTICLIX) lancets Check blood sugar three times daily 100 each 11  . metFORMIN (GLUCOPHAGE) 500 MG tablet TAKE 1 TABLET BY MOUTH TWICE DAILY WITH A MEAL 180 tablet 1  . metoprolol succinate (TOPROL-XL) 25 MG 24 hr tablet Take 0.5 tablets (12.5 mg total) by mouth daily.  45 tablet 3  . Multiple Vitamin (MULTIVITAMIN WITH MINERALS) TABS tablet Take 1 tablet by mouth daily.    . rosuvastatin (CRESTOR) 5 MG tablet Take 1 tablet (5 mg total) by mouth once a week. 13 tablet 3   No current facility-administered medications for this visit.    Allergies-reviewed and updated Allergies  Allergen Reactions  . Codeine Nausea Only  . Penicillins Nausea And Vomiting, Swelling and Other (See Comments)    Has patient had a PCN reaction causing immediate rash, facial/tongue/throat swelling, SOB or lightheadedness with hypotension: Yes Has patient had a PCN reaction causing severe rash involving mucus membranes or skin necrosis: No Has patient had a PCN reaction that required hospitalization: No Has patient had a PCN reaction occurring within the last 10 years: No If all of the  above answers are "NO", then may proceed with Cephalosporin use.   . Latex Rash    Social History   Social History Narrative   Married, 4 children (daughter Charisma patient here, husband Patsy [place], son [place]). 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      Hobbies: time with family, watching news, reading            Objective  Objective:  BP 138/88   Pulse 69   Temp 98.1 F (36.7 C)   Ht 5' 3.5" (1.613 m)   Wt 169 lb 12.8 oz (77 kg)   LMP  (LMP Unknown)   SpO2 97%   BMI 29.61 kg/m  Gen: NAD, resting comfortably HEENT: Mucous membranes are moist. Oropharynx normal Neck: no thyromegaly CV: RRR no murmurs rubs or gallops Lungs: CTAB no crackles, wheeze, rhonchi Abdomen: soft/nontender/nondistended/normal bowel sounds. No rebound or guarding.  Ext: no edema Skin: warm, dry Neuro: CN II-XII intact, sensation and reflexes normal throughout, 5/5 muscle strength in bilateral upper and lower extremities. Normal finger to nose. Normal rapid alternating movements. No pronator drift. Normal romberg. Normal gait.     Assessment and Plan   64 y.o. female presenting for annual physical.  Health Maintenance counseling: 1. Anticipatory guidance: Patient counseled regarding regular dental exams yes q6 months, eye exams yes,  avoiding smoking and second hand smoke yes , limiting alcohol to 0 beverage per day .   2. Risk factor reduction:  Advised patient of need for regular exercise and diet rich and fruits and vegetables to reduce risk of heart attack and stroke. Exercise- aerobics. Diet-cooks at home.  Wt Readings from Last 3 Encounters:  04/22/19 169 lb 12.8 oz (77 kg)  03/08/19 169 lb (76.7 kg)  01/02/19 165 lb (74.8 kg)  3. Immunizations/screenings/ancillary studies- declines covid vaccine Immunization History  Administered Date(s) Administered  . Hep A / Hep B 04/16/2015, 06/22/2015, 01/29/2016    . Influenza Inj Mdck Quad Pf 11/23/2018  . Influenza Whole 01/19/2016  . Influenza,inj,Quad PF,6+ Mos 01/16/2018  . Influenza-Unspecified 11/26/2014, 12/19/2016  . Pneumococcal Polysaccharide-23 09/30/2015  . Tdap 07/24/2014   Health Maintenance Due  Topic Date Due  . PAP SMEAR-Modifier  11/30/2018  . OPHTHALMOLOGY EXAM - she sees him Wednesday and will request copy 12/12/2018  . MAMMOGRAM  01/27/2019   4. Cervical cancer screening- last 2017- due for repeat with Dr. Talbert Nan- she will call to schedule 5. Breast cancer screening-  breast exam yes and mammogram -overdue encouraged her to call to schedule 6. Colon cancer screening - 07/25/16 with  5 year follow up with Dr. Silverio Decamp 7. Skin cancer screening- no dermatologist but wants referral for itching on faceadvised regular sunscreen use. Denies worrisome, changing, or new skin lesions.  8. Birth control/STD check- monogomous and postmenopausal 9. Osteoporosis screening at 61- will plan on this next year -Never smoker  Status of chronic or acute concerns   # Diabetes S: compliant with metformin 500mg  once daily. Was having diarrhea on higher doses. Possibly could add [place] if needed if gets high and cannot control with diet/exercise.  Lab Results  Component Value Date   HGBA1C 7.6 (H) 04/08/2019  A/P: a1c above goal at 7.6. She wants to restart the 2nd dose of the day so she will take 500mg  in the morning and we will start with just a half tablet before dinner for a week and if she tolerates that without diarrhea can take a full pill. If she gets diarrhea with this she will contact me and we can consider alternates. Januvia would be a reasonable alternate   Hyperlipidemia  S: in past on  rosuvastatin 5 mg once a week.  She has not been taking recently. A/P: poor control on last labs for cholesterol- restart very low dose  Rosuvastatin 5 mg once a week while also working on diet and exercise  Hypertension S:compliant with amlodipine  2.5 mg once a day as well as metoprolol 12.5mg  extended release.    Stopped HCTZ due to potentially causing gout.  Stopped ACE inhibitor in the past due to cough. A/P:  High normal blood pressure today- instead of increasing medicine she is going to continue amlodipine and metoprolol but also work on weight loss, low salt diet, healthy eating, regular exercise- exercise has been down with colder weather   Headache, unspecified headache type S: Patient reports ongoing issues with what she believes to be stress/poor sleep related headaches on the left side of her head.Had negative temporal artery biopsy a few years ago.  No vision loss reported.Now more in left scalp. Worse issues when not sleeping well. Trying unisom.  still getting some stress headaches mainly on the left side. advil or tylenol helps.    No show neurology 10/09/2017 as she reported resolution in headaches. She states since that time has had On and off headaches- very light headache.  A/P:  Patient feels like headache issues are related to poor sleep.  Unisom has not been as helpful as she would like.  We discussed possible trial of trazodone but she would like to think this over first.  At the end of the visit patient asks me if I can read complete FMLA which was completed in 2019-this was during the time of work-up for the headaches.  I asked patient how frequently she is affected by headaches at work and she states anywhere from once a week to once a month-she would like to have the ability to rest when the headaches occur.  I recommended we try the trazodone first and if she continues to have issues to get brain MRI without contrast due to new headache over age 50 as well as have her back in to discuss FMLA paperwork  After visit I reviewed prior work-up and she did have CT angio of the head and neck so I do feel better about neuro imaging already at least being completed but we can discuss whether we need to proceed forward with MRI  with ongoing headaches at follow-up or refer her back to neurology for this discussion since MRIs  could be expensive   We reviewed prior labs from [facility] with elevated A1c and lipid panel  Recommended follow up: 36-month diabetes follow-up recommended Future Appointments  Date Time Provider [place]  08/22/2019  9:40 AM Yong Channel Brayton Mars, MD [id number]   Lab/Order associations: fasting   ICD-10-CM   1. Preventative health care  Z00.00   2. Itching  L29.9 Ambulatory referral to Dermatology  3. Headache, unspecified headache type  R51.9     Meds ordered this encounter  Medications  . rosuvastatin (CRESTOR) 5 MG tablet    Sig: Take 1 tablet (5 mg total) by mouth once a week.    Dispense:  13 tablet    Refill:  3    Return precautions advised.  Garret Reddish, MD

## 2019-04-25 ENCOUNTER — Ambulatory Visit: Payer: Federal, State, Local not specified - PPO | Admitting: Family Medicine

## 2019-04-25 LAB — HM DIABETES EYE EXAM

## 2019-04-30 ENCOUNTER — Encounter: Payer: Self-pay | Admitting: Family Medicine

## 2019-05-06 ENCOUNTER — Other Ambulatory Visit: Payer: Self-pay | Admitting: Family Medicine

## 2019-05-06 DIAGNOSIS — Z1231 Encounter for screening mammogram for malignant neoplasm of breast: Secondary | ICD-10-CM

## 2019-06-05 ENCOUNTER — Encounter (INDEPENDENT_AMBULATORY_CARE_PROVIDER_SITE_OTHER): Payer: Self-pay

## 2019-06-05 ENCOUNTER — Ambulatory Visit: Payer: Federal, State, Local not specified - PPO | Admitting: Dermatology

## 2019-06-05 ENCOUNTER — Ambulatory Visit
Admission: RE | Admit: 2019-06-05 | Discharge: 2019-06-05 | Disposition: A | Discharge status: Home / Self Care | Payer: Federal, State, Local not specified - PPO | Source: Ambulatory Visit | Attending: Family Medicine | Admitting: Family Medicine

## 2019-06-05 ENCOUNTER — Encounter: Payer: Self-pay | Admitting: Dermatology

## 2019-06-05 ENCOUNTER — Other Ambulatory Visit: Payer: Self-pay

## 2019-06-05 DIAGNOSIS — L821 Other seborrheic keratosis: Secondary | ICD-10-CM | POA: Diagnosis not present

## 2019-06-05 DIAGNOSIS — Z1231 Encounter for screening mammogram for malignant neoplasm of breast: Secondary | ICD-10-CM

## 2019-06-05 DIAGNOSIS — Z1283 Encounter for screening for malignant neoplasm of skin: Secondary | ICD-10-CM | POA: Diagnosis not present

## 2019-06-05 DIAGNOSIS — D225 Melanocytic nevi of trunk: Secondary | ICD-10-CM

## 2019-06-05 DIAGNOSIS — D229 Melanocytic nevi, unspecified: Secondary | ICD-10-CM

## 2019-06-05 DIAGNOSIS — L820 Inflamed seborrheic keratosis: Secondary | ICD-10-CM | POA: Diagnosis not present

## 2019-06-05 NOTE — Progress Notes (Addendum)
   New Patient   Subjective  Dana Mcmahon is a 64 y.o. female who presents for the following: Skin Problem (face( right upper eyelid & Right cheek) and scalp- itchy all the time-using over the counter hydrocortisone-no help).  spots scalp Location:  Duration: Months Quality: Undisturbed Associated Signs/Symptoms: Scaling and itch Modifying Factors: Hydrocortisone lotion did not help Severity:  Timing: Context:    The following portions of the chart were reviewed this encounter and updated as appropriate: Tobacco  Allergies  Meds  Problems  Med Hx  Surg Hx  Fam Hx      Objective  Well appearing patient in no apparent distress; mood and affect are within normal limits.  All skin waist up examined.  New patient visit for [place]ino woman Dana Mcmahon. Dana Mcmahon.  Chief complaint are very itchy spots in the scalp; examination showed lichenified patches on the right and central occiput compatible with irritated keratosis; I cannot rule out neurodermatitis or localized psoriasis.  These were treated with 6-second LN2 freeze.  If there is no improvement, we will try a prescription class I potency corticoid.  Smaller irritated keratoses on the right inner cheek and right lower central eyebrow were treated with a 3-second freeze.  Tiny brown dots on the cheekbones represent seborrheic keratoses which require no intervention.  A larger seborrheic keratosis on the right front outer rib cage can be left.  A slightly smudgy mole on the left lower back had normal dermoscopy and does not require biopsy currently.  Recheck 6 to 8 weeks; Dana Mcmahon knows if everything is doing well she can cancel that visit.  Also given sample of CeraVe itch relief which she may use on an as needed basis for any itching. Assessment & Plan  Inflamed seborrheic keratosis (4) Right Supraorbital Region; Right Occipital Scalp; Mid Occipital Scalp; Right Buccal Cheek   Cryotherapy with liquid nitrogen, 3-4 seconds for small  facial lesions, 6-8 seconds for occiput.  Destruction of lesion - Mid Occipital Scalp, Right Buccal Cheek , Right Occipital Scalp, Right Supraorbital Region Complexity: simple   Destruction method: cryotherapy   Informed consent: discussed and consent obtained   Timeout:  patient name, date of birth, surgical site, and procedure verified Lesion destroyed using liquid nitrogen: Yes   Region frozen until ice ball extended beyond lesion: Yes   Outcome: patient tolerated procedure well with no complications   Post-procedure details: wound care instructions given    Nevus Right Lower Back  Leave if stable

## 2019-06-09 ENCOUNTER — Encounter: Payer: Self-pay | Admitting: Dermatology

## 2019-06-22 IMAGING — CT CT ANGIO NECK
1 of 14 series · 5 of 33 positions shown · IV contrast (APPLIED)
Comparison: CT HEAD March 26, 2017

CLINICAL DATA: Acute onset pain and limited use of RIGHT and
subsequently LEFT extremities. Low back pain. Headache. Assess
diplopia. History of hypertension, hyperlipidemia.

EXAM:
CT ANGIOGRAPHY HEAD AND NECK
TECHNIQUE: Multidetector CT imaging of the head and neck was performed using
the standard protocol during bolus administration of intravenous
contrast. Multiplanar CT image reconstructions and MIPs were
obtained to evaluate the vascular anatomy. Carotid stenosis
measurements (when applicable) are obtained utilizing NASCET
criteria, using the distal internal carotid diameter as the
denominator.
CONTRAST:  100mL 6W9HQ9-CSS IOPAMIDOL ([id number]-CSS) INJECTION 76%

[Series 11: axial thin · axial · 0.39mm/px · z∈[+1085,+1317]mm · 5 of 348 slices shown]
[im 58/348  soft-tissue]
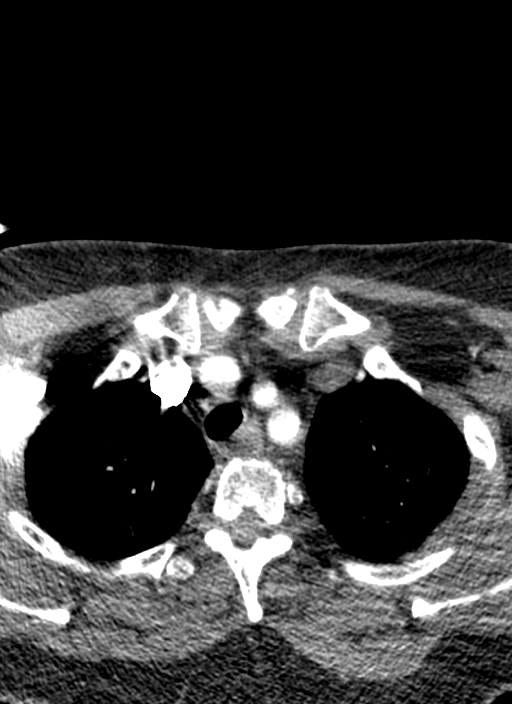
[im 116/348  bone]
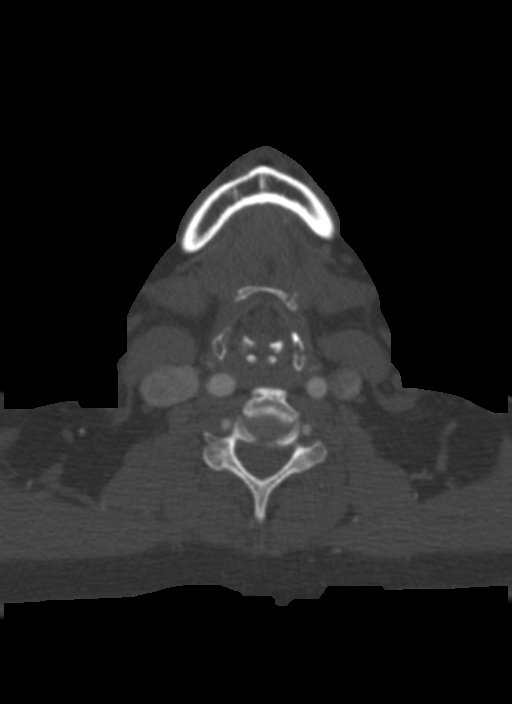
[im 174/348  soft-tissue]
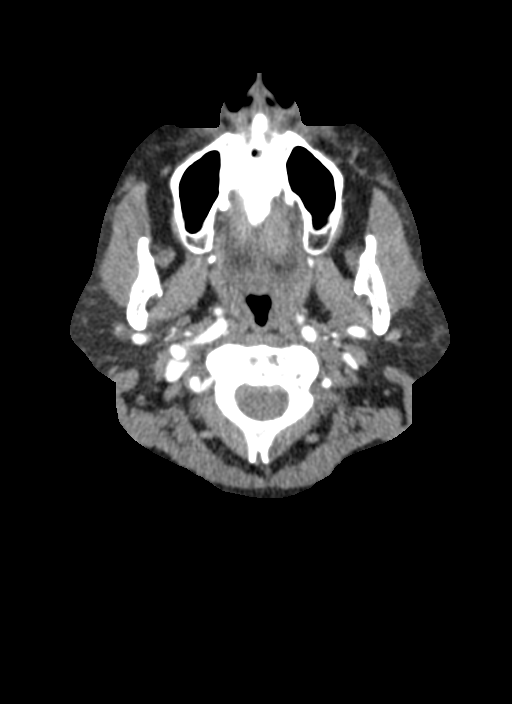
[im 232/348  bone]
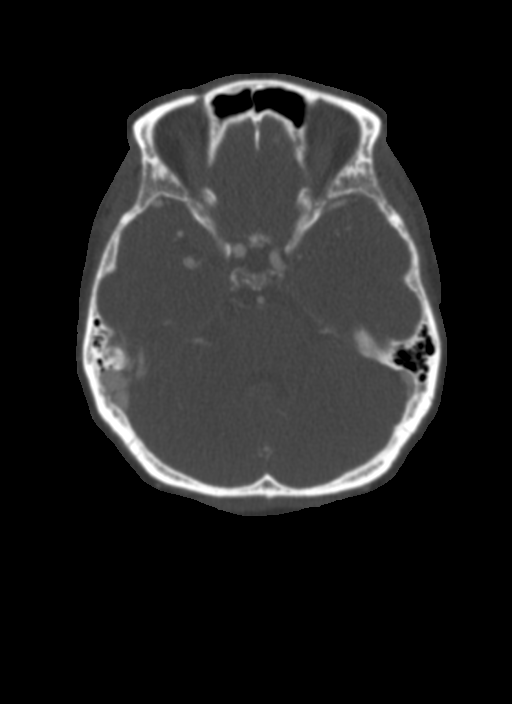
[im 290/348  soft-tissue]
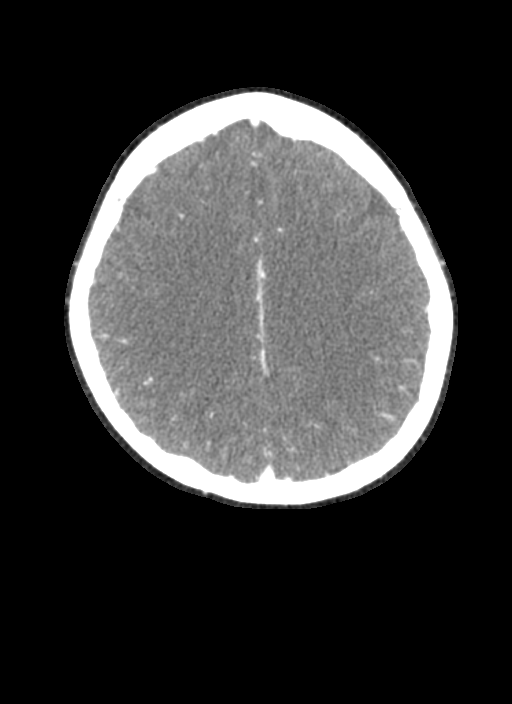

[5 of 33 positions shown; findings below may reference images not displayed]

FINDINGS: CT HEAD FINDINGS

BRAIN: No intraparenchymal hemorrhage, mass effect nor midline
shift. The ventricles and sulci are normal for age. Patchy
supratentorial white matter hypodensities within normal range for
patient's age, though non-specific are most compatible with chronic
small vessel ischemic disease. No acute large vascular territory
infarcts. No abnormal extra-axial fluid collections. Basal cisterns
are patent.

VASCULAR: Moderate calcific atherosclerosis of the carotid siphons.

SKULL: No skull fracture. No significant scalp soft tissue swelling.

SINUSES/ORBITS: The mastoid air-cells and included paranasal sinuses
are well-aerated.The included ocular globes and orbital contents are
non-suspicious.

OTHER: None.

CTA NECK FINDINGS:

AORTIC ARCH: Normal appearance of the thoracic arch, normal branch
pattern. Trace calcific atherosclerosis aortic arch. The origins of
the innominate, left Common carotid artery and subclavian artery are
widely patent.

RIGHT CAROTID SYSTEM: Common carotid artery is widely patent, mildly
tortuous retropharyngeal course. Mild calcific atherosclerosis of
the carotid bifurcation without hemodynamically significant stenosis
by NASCET criteria. Patent internal carotid artery with multiple
folds associated with chronic hypertension.

LEFT CAROTID SYSTEM: Common carotid artery is widely patent,
coursing in a straight line fashion. Mild calcific atherosclerosis
carotid bifurcation without hemodynamically significant stenosis by
NASCET criteria. Patent internal carotid artery with multiple folds
associated with chronic hypertension.

VERTEBRAL ARTERIES:Codominant vertebral arteries. Normal appearance
of the vertebral arteries, widely patent.

SKELETON: No acute osseous process though bone windows have not been
submitted. Mild canal stenosis C3-4 due to ossified posterior
longitudinal ligament. No significant neural foraminal narrowing.

OTHER NECK: Soft tissues of the neck are nonacute though, not
tailored for evaluation.

UPPER CHEST: Included lung apices are clear. No superior mediastinal
lymphadenopathy. Enlarged main pulmonary artery is 3.7 cm associated
with chronic pulmonary arterial hypertension.

CTA HEAD FINDINGS:

ANTERIOR CIRCULATION: Patent cervical internal carotid arteries,
petrous, cavernous and supra clinoid internal carotid arteries.
Patent anterior communicating artery. Patent anterior and middle
cerebral arteries.

No large vessel occlusion, significant stenosis, contrast
extravasation or aneurysm.

POSTERIOR CIRCULATION: Patent vertebral arteries, vertebrobasilar
junction and basilar artery, as well as main branch vessels. Patent
posterior cerebral arteries. Robust RIGHT posterior communicating
artery present.

No large vessel occlusion, significant stenosis, contrast
extravasation or aneurysm.

VENOUS SINUSES: Major dural venous sinuses are patent though not
tailored for evaluation on this angiographic examination.

ANATOMIC VARIANTS: None.

DELAYED PHASE: No abnormal intracranial enhancement.

MIP images reviewed.
IMPRESSION: CTA NECK:

1. No hemodynamically significant stenosis or acute vascular process
in the neck.
2. Findings of chronic hypertension.

CTA HEAD:

1. No emergent large vessel occlusion or flow limiting stenosis.

## 2019-06-22 IMAGING — CR DG CHEST 2V
2 series · 2 of 2 positions shown · non-contrast
Comparison: 01/22/2016 chest radiograph.

CLINICAL DATA: Palpitations

EXAM:
CHEST - 2 VIEW

[w chest pa]
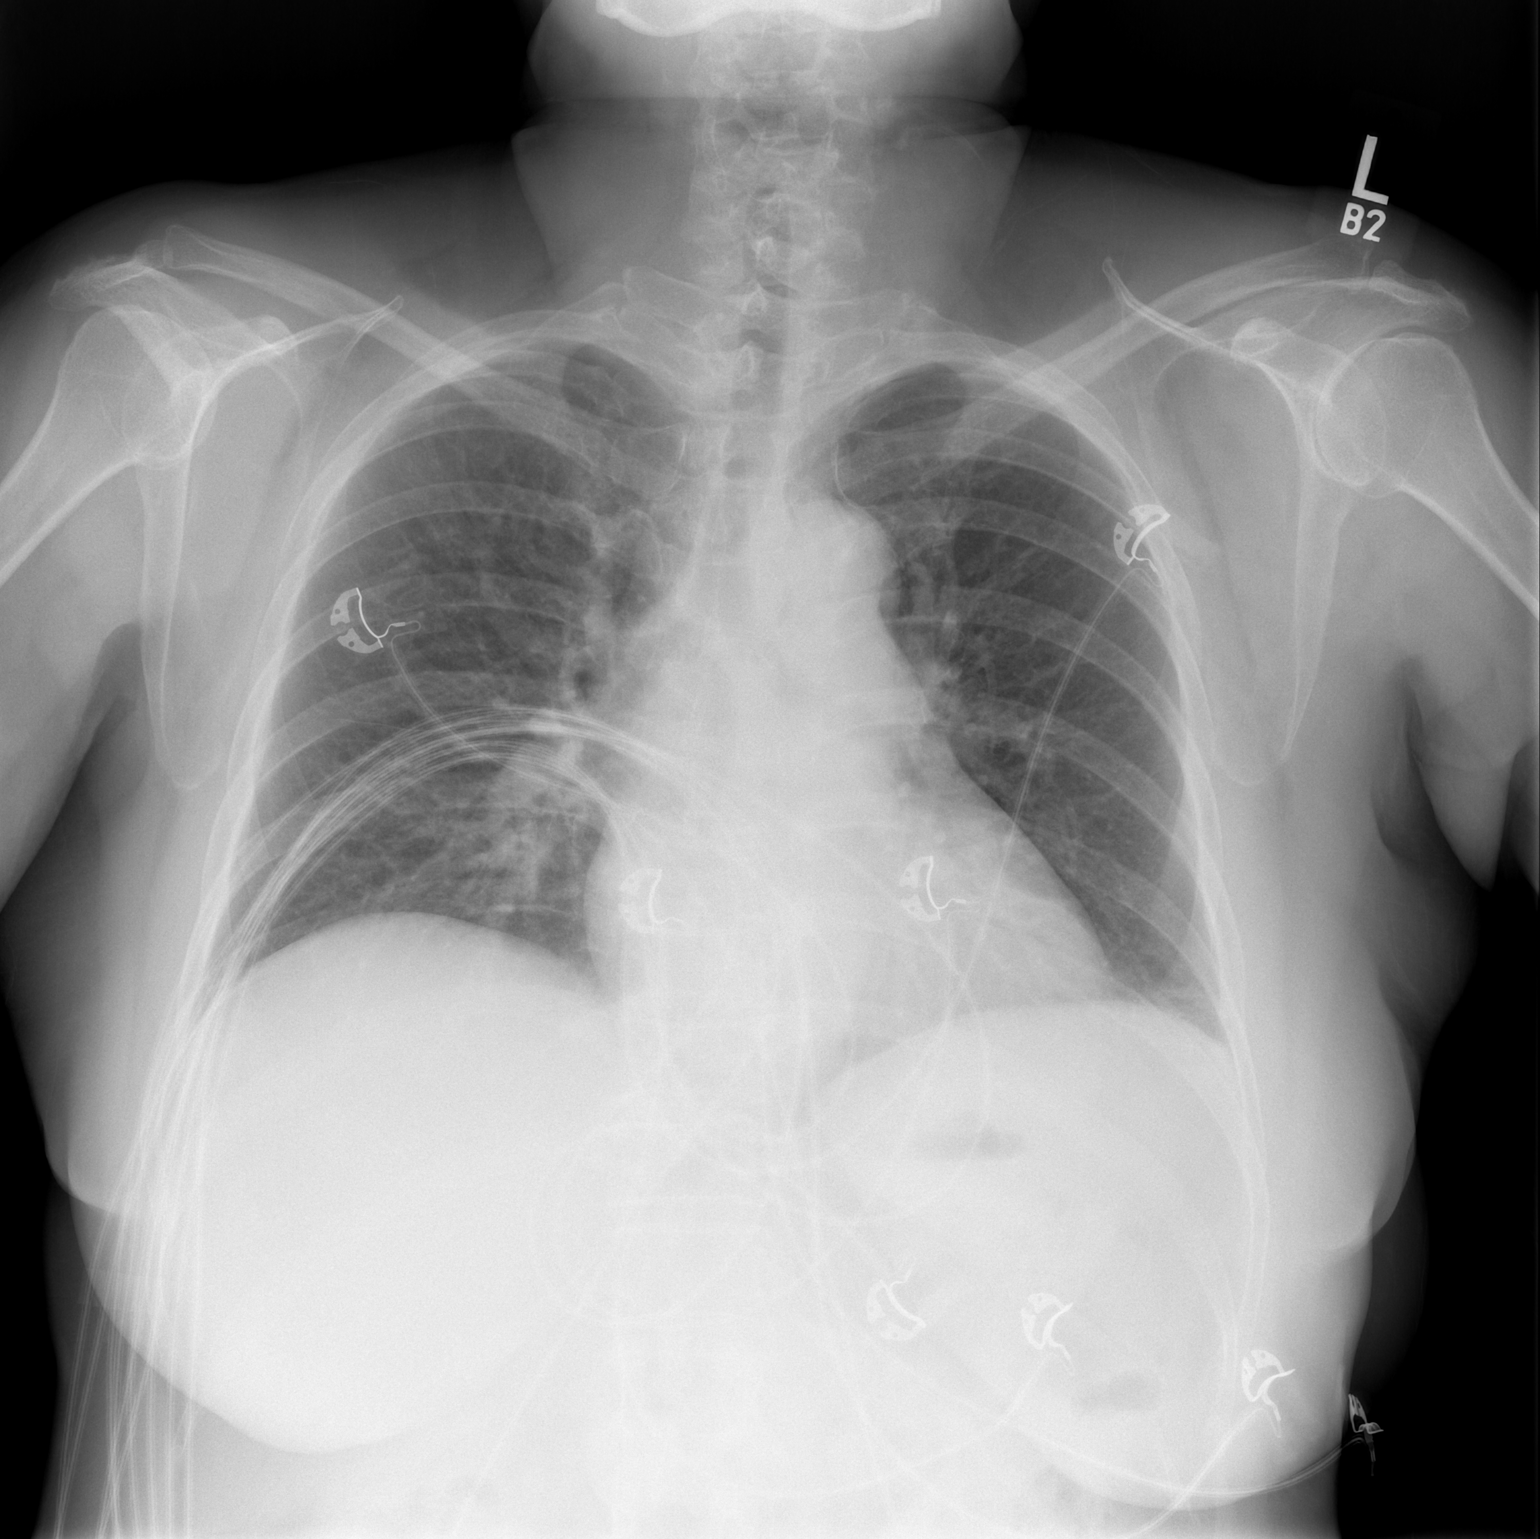

[w chest lat]
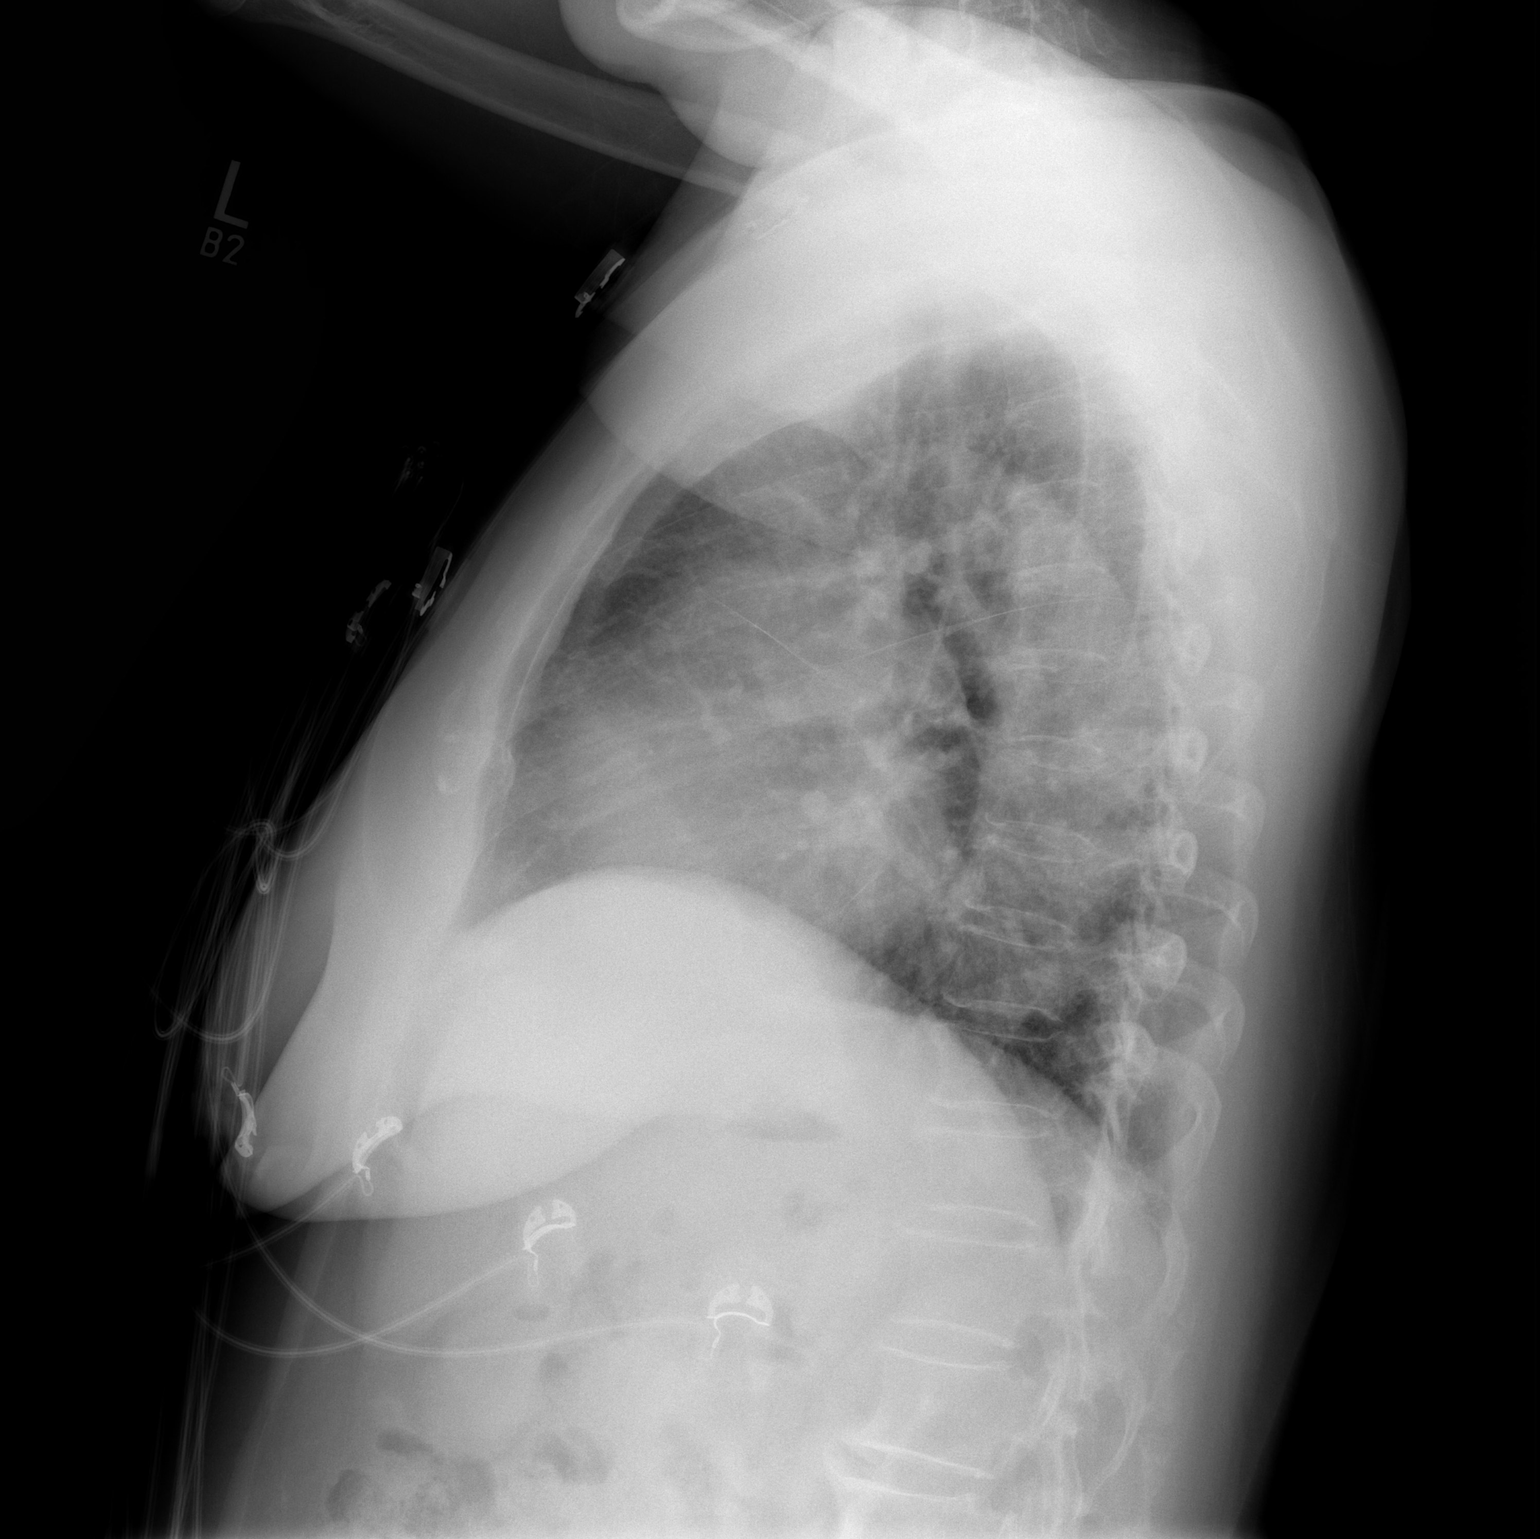

[2 of 2 positions shown; findings below may reference images not displayed]

FINDINGS: Stable cardiomediastinal silhouette with normal heart size. No
pneumothorax. No pleural effusion. Lungs appear clear, with no acute
consolidative airspace disease and no pulmonary edema.
IMPRESSION: No active cardiopulmonary disease.

## 2019-06-25 ENCOUNTER — Other Ambulatory Visit: Payer: Self-pay | Admitting: Family Medicine

## 2019-08-07 ENCOUNTER — Other Ambulatory Visit: Payer: Self-pay

## 2019-08-08 ENCOUNTER — Encounter: Payer: Self-pay | Admitting: Obstetrics and Gynecology

## 2019-08-08 ENCOUNTER — Ambulatory Visit: Payer: Federal, State, Local not specified - PPO | Admitting: Obstetrics and Gynecology

## 2019-08-08 VITALS — BP 138/68 | HR 71 | Temp 98.6°F | Ht 63.0 in | Wt 163.0 lb

## 2019-08-08 DIAGNOSIS — Z01419 Encounter for gynecological examination (general) (routine) without abnormal findings: Secondary | ICD-10-CM

## 2019-08-08 DIAGNOSIS — M7918 Myalgia, other site: Secondary | ICD-10-CM | POA: Diagnosis not present

## 2019-08-08 NOTE — Patient Instructions (Addendum)
EXERCISE AND DIET:  We recommended that you start or continue a regular exercise program for good health. Regular exercise means any activity that makes your heart beat faster and makes you sweat.  We recommend exercising at least 30 minutes per day at least 3 days a week, preferably 4 or 5.  We also recommend a diet low in fat and sugar.  Inactivity, poor dietary choices and obesity can cause diabetes, heart attack, stroke, and kidney damage, among others.    ALCOHOL AND SMOKING:  Women should limit their alcohol intake to no more than 7 drinks/beers/glasses of wine (combined, not each!) per week. Moderation of alcohol intake to this level decreases your risk of breast cancer and liver damage. And of course, no recreational drugs are part of a healthy lifestyle.  And absolutely no smoking or even second hand smoke. Most people know smoking can cause heart and lung diseases, but did you know it also contributes to weakening of your bones? Aging of your skin?  Yellowing of your teeth and nails?  CALCIUM AND VITAMIN D:  Adequate intake of calcium and Vitamin D are recommended.  The recommendations for exact amounts of these supplements seem to change often, but generally speaking 1,200 mg of calcium (between diet and supplement) and 800 units of Vitamin D per day seems prudent. Certain women may benefit from higher intake of Vitamin D.  If you are among these women, your doctor will have told you during your visit.    PAP SMEARS:  Pap smears, to check for cervical cancer or precancers,  have traditionally been done yearly, although recent scientific advances have shown that most women can have pap smears less often.  However, every woman still should have a physical exam from her gynecologist every year. It will include a breast check, inspection of the vulva and vagina to check for abnormal growths or skin changes, a visual exam of the cervix, and then an exam to evaluate the size and shape of the uterus and  ovaries.  And after 65 years of age, a rectal exam is indicated to check for rectal cancers. We will also provide age appropriate advice regarding health maintenance, like when you should have certain vaccines, screening for sexually transmitted diseases, bone density testing, colonoscopy, mammograms, etc.   MAMMOGRAMS:  All women over 40 years old should have a yearly mammogram. Many facilities now offer a "3D" mammogram, which may cost around $50 extra out of pocket. If possible,  we recommend you accept the option to have the 3D mammogram performed.  It both reduces the number of women who will be called back for extra views which then turn out to be normal, and it is better than the routine mammogram at detecting truly abnormal areas.    COLON CANCER SCREENING: Now recommend starting at age 45. At this time colonoscopy is not covered for routine screening until 50. There are take home tests that can be done between 45-49.   COLONOSCOPY:  Colonoscopy to screen for colon cancer is recommended for all women at age 50.  We know, you hate the idea of the prep.  We agree, BUT, having colon cancer and not knowing it is worse!!  Colon cancer so often starts as a polyp that can be seen and removed at colonscopy, which can quite literally save your life!  And if your first colonoscopy is normal and you have no family history of colon cancer, most women don't have to have it again for   10 years.  Once every ten years, you can do something that may end up saving your life, right?  We will be happy to help you get it scheduled when you are ready.  Be sure to check your insurance coverage so you understand how much it will cost.  It may be covered as a preventative service at no cost, but you should check your particular policy.      Breast Self-Awareness Breast self-awareness means being familiar with how your breasts look and feel. It involves checking your breasts regularly and reporting any changes to your  health care provider. Practicing breast self-awareness is important. A change in your breasts can be a sign of a serious medical problem. Being familiar with how your breasts look and feel allows you to find any problems early, when treatment is more likely to be successful. All women should practice breast self-awareness, including women who have had breast implants. How to do a breast self-exam One way to learn what is normal for your breasts and whether your breasts are changing is to do a breast self-exam. To do a breast self-exam: Look for Changes  1. Remove all the clothing above your waist. 2. Stand in front of a mirror in a room with good lighting. 3. Put your hands on your hips. 4. Push your hands firmly downward. 5. Compare your breasts in the mirror. Look for differences between them (asymmetry), such as: ? Differences in shape. ? Differences in size. ? Puckers, dips, and bumps in one breast and not the other. 6. Look at each breast for changes in your skin, such as: ? Redness. ? Scaly areas. 7. Look for changes in your nipples, such as: ? Discharge. ? Bleeding. ? Dimpling. ? Redness. ? A change in position. Feel for Changes Carefully feel your breasts for lumps and changes. It is best to do this while lying on your back on the floor and again while sitting or standing in the shower or tub with soapy water on your skin. Feel each breast in the following way:  Place the arm on the side of the breast you are examining above your head.  Feel your breast with the other hand.  Start in the nipple area and make  inch (2 cm) overlapping circles to feel your breast. Use the pads of your three middle fingers to do this. Apply light pressure, then medium pressure, then firm pressure. The light pressure will allow you to feel the tissue closest to the skin. The medium pressure will allow you to feel the tissue that is a little deeper. The firm pressure will allow you to feel the tissue  close to the ribs.  Continue the overlapping circles, moving downward over the breast until you feel your ribs below your breast.  Move one finger-width toward the center of the body. Continue to use the  inch (2 cm) overlapping circles to feel your breast as you move slowly up toward your collarbone.  Continue the up and down exam using all three pressures until you reach your armpit.  Write Down What You Find  Write down what is normal for each breast and any changes that you find. Keep a written record with breast changes or normal findings for each breast. By writing this information down, you do not need to depend only on memory for size, tenderness, or location. Write down where you are in your menstrual cycle, if you are still menstruating. If you are having trouble noticing differences   differences in your breasts, do not get discouraged. With time you will become more familiar with the variations in your breasts and more comfortable with the exam. How often should I examine my breasts? Examine your breasts every month. If you are breastfeeding, the best time to examine your breasts is after a feeding or after using a breast pump. If you menstruate, the best time to examine your breasts is 5-7 days after your period is over. During your period, your breasts are lumpier, and it may be more difficult to notice changes. When should I see my health care provider? See your health care provider if you notice:  A change in shape or size of your breasts or nipples.  A change in the skin of your breast or nipples, such as a reddened or scaly area.  Unusual discharge from your nipples.  A lump or thick area that was not there before.  Pain in your breasts.  Anything that concerns you.  Musculoskeletal Pain Musculoskeletal pain refers to aches and pains in your bones, joints, muscles, and the tissues that surround them. This pain can occur in any part of the body. It can last for a short time (acute)  or a long time (chronic). A physical exam, lab tests, and imaging studies may be done to find the cause of your musculoskeletal pain. Follow these instructions at home:  Lifestyle  Try to control or lower your stress levels. Stress increases muscle tension and can worsen musculoskeletal pain. It is important to recognize when you are anxious or stressed and learn ways to manage it. This may include: ? Meditation or yoga. ? Cognitive or behavioral therapy. ? Acupuncture or massage therapy.  You may continue all activities unless the activities cause more pain. When the pain gets better, slowly resume your normal activities. Gradually increase the intensity and duration of your activities or exercise. Managing pain, stiffness, and swelling  Take over-the-counter and prescription medicines only as told by your health care provider.  When your pain is severe, bed rest may be helpful. Lie or sit in any position that is comfortable, but get out of bed and walk around at least every couple of hours.  If directed, apply heat to the affected area as often as told by your health care provider. Use the heat source that your health care provider recommends, such as a moist heat pack or a heating pad. ? Place a towel between your skin and the heat source. ? Leave the heat on for 20-30 minutes. ? Remove the heat if your skin turns bright red. This is especially important if you are unable to feel pain, heat, or cold. You may have a greater risk of getting burned.  If directed, put ice on the painful area. ? Put ice in a plastic bag. ? Place a towel between your skin and the bag. ? Leave the ice on for 20 minutes, 2-3 times a day. General instructions  Your health care provider may recommend that you see a physical therapist. This person can help you come up with a safe exercise program. Do any exercises as told by your physical therapist.  Keep all follow-up visits, including any physical therapy  visits, as told by your health care providers. This is important. Contact a health care provider if:  Your pain gets worse.  Medicines do not help ease your pain.  You cannot use the part of your body that hurts, such as your arm, leg, or neck.  You   have trouble sleeping.  You have trouble doing your normal activities. Get help right away if:  You have a new injury and your pain is worse or different.  You feel numb or you have tingling in the painful area. Summary  Musculoskeletal pain refers to aches and pains in your bones, joints, muscles, and the tissues that surround them.  This pain can occur in any part of the body.  Your health care provider may recommend that you see a physical therapist. This person can help you come up with a safe exercise program. Do any exercises as told by your physical therapist.  Lower your stress level. Stress can worsen musculoskeletal pain. Ways to lower stress may include meditation, yoga, cognitive or behavioral therapy, acupuncture, and massage therapy. This information is not intended to replace advice given to you by your health care provider. Make sure you discuss any questions you have with your health care provider. Document Revised: 01/20/2017 Document Reviewed: 03/09/2016 Elsevier Patient Education  2020 [employer]vier Inc.  

## 2019-08-08 NOTE — Progress Notes (Signed)
64 y.o. [id number] Married Declined Declined female here for annual exam.  Patient states that when she is working she gets a throbbing pain in her groin. The pain started 2-3 months ago, intermittent, 5-6/10 in severity. She hasn't noticed a bulge. Feels uncomfortable when she is sitting or squatting. Walking is okay.  No vaginal bleeding. Not really sexually active, not interested. No bowel or bladder issues.     No LMP recorded (lmp unknown). Patient is postmenopausal.          Sexually active: Yes.    The current method of family planning is post menopausal status.    Exercising: Yes.    stretching, flexing, dancing  Smoker:  no  Health Maintenance: Pap:  11/30/15 WNL HPV NEG  History of abnormal Pap:  no MMG:  4/14/21Density B Bi-rads 1 neg  BMD:   Never  Colonoscopy: 07/08/16 Normal  TDaP:  07/24/14 Gardasil: NA   reports that she has never smoked. She has never used smokeless tobacco. She reports that she does not drink alcohol and does not use drugs. Works from home in [place] for the [employer]. Kids 31, 28, 107, 63. 50 year old twin grand children, local. Only oldest child is in the [place], married.   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     BIOTIN PO Take 1 tablet by mouth daily.     Cholecalciferol (VITAMIN D3 PO) Take 1 capsule by mouth daily.     diphenhydrAMINE HCl, Sleep, (UNISOM SLEEPGELS) 50 MG CAPS Take 50 mg by mouth at bedtime.      ELDERBERRY PO Take by mouth.     glucose blood (ACCU-CHEK GUIDE) test strip Check blood sugar three times daily 100 each 11   Lancets (ACCU-CHEK MULTICLIX) lancets Check blood sugar three times daily 100 each 11   metFORMIN (GLUCOPHAGE) 500 MG tablet TAKE 1 TABLET BY MOUTH TWICE DAILY WITH A MEAL 180 tablet 1   metoprolol succinate (TOPROL-XL) 25 MG 24 hr tablet Take 0.5 tablets (12.5 mg total) by mouth daily. 45 tablet 3   Multiple Vitamin (MULTIVITAMIN WITH MINERALS) TABS tablet Take 1 tablet by mouth daily.     rosuvastatin (CRESTOR) 5 MG tablet Take 1 tablet (5 mg total) by mouth once a week. 13 tablet 3   vitamin E 1000 UNIT capsule Take 1,000 Units by mouth daily.     No current facility-administered medications for this visit.    Family History  Problem Relation Age of Onset  Stroke Mother        84s, and father 10s   Hypertension Mother        and father   Stroke Brother        in 37s   Colon cancer Neg Hx    Esophageal cancer Neg Hx    Rectal cancer Neg Hx    Stomach cancer Neg Hx     Review of Systems  Genitourinary: Positive for urgency.  All other systems reviewed and are negative.   Exam:   BP 138/68    Pulse 71    Temp 98.6 F (37 C)    Ht 5\' 3"  (1.6 m)    Wt 163 lb (73.9 kg)    LMP  (LMP Unknown)    SpO2 98%    BMI 28.87 kg/m   Weight change: @WEIGHTCHANGE @ Height:   Height: 5\' 3"  (160 cm)  Ht Readings from Last 3 Encounters:  08/08/19 5\' 3"  (1.6 m)  04/22/19 5' 3.5" (1.613 m)  03/08/19 5' 3.5" (1.613 m)    General appearance: alert, cooperative and appears stated age Head: Normocephalic, without obvious abnormality,  atraumatic Neck: no adenopathy, supple, symmetrical, trachea midline and thyroid normal to inspection and palpation Lungs: clear to auscultation bilaterally Cardiovascular: regular rate and rhythm Breasts: normal appearance, no masses or tenderness Abdomen: soft, tender in the lower, lateral RLQ. Just as tender or more tender with palpation with tensed muscles; non distended,  no masses,  no organomegaly. No signs of inguinal hernia.  Extremities: extremities normal, atraumatic, no cyanosis or edema Skin: Skin color, texture, turgor normal. No rashes or lesions Lymph nodes: Cervical, supraclavicular, and axillary nodes normal. No abnormal inguinal nodes palpated Neurologic: Grossly normal   Pelvic: External genitalia:  no lesions              Urethra:  normal appearing urethra with no masses, tenderness or lesions              Bartholins and Skenes: normal                 Vagina: atrophic appearing vagina with normal color and discharge, no lesions              Cervix: no lesions               Bimanual Exam:  Uterus:  normal size, contour, position, consistency, mobility, non-tender              Adnexa: no mass, fullness, tenderness               Rectovaginal: Confirms               Anus:  normal sphincter tone, no lesions  Gae Dry chaperoned for the exam.  A:  Well Woman with normal exam  MS pain in her lower right abdomen  P:   Pap next year  Mammogram and colonoscopy UTD  DEXA next year  Labs with primary  Discussed breast self exam  Discussed calcium and vit D intake  Information given on MS pain, can try ice/heat/ibuprofen. Discussed referral to PT if it doesn't improve

## 2019-08-22 ENCOUNTER — Ambulatory Visit: Payer: Federal, State, Local not specified - PPO | Admitting: Family Medicine

## 2019-09-17 ENCOUNTER — Other Ambulatory Visit: Payer: Self-pay | Admitting: Family Medicine

## 2019-10-04 ENCOUNTER — Other Ambulatory Visit: Payer: Self-pay | Admitting: Family Medicine

## 2019-11-04 ENCOUNTER — Ambulatory Visit: Payer: Federal, State, Local not specified - PPO | Admitting: Family Medicine

## 2019-12-30 ENCOUNTER — Other Ambulatory Visit: Payer: Self-pay | Admitting: Family Medicine

## 2019-12-31 ENCOUNTER — Ambulatory Visit: Payer: Federal, State, Local not specified - PPO | Admitting: Family Medicine

## 2020-01-13 NOTE — Progress Notes (Signed)
Phone [phone] In person visit   Subjective:   Dana Mcmahon is a 64 y.o. year old very pleasant female patient who presents for/with See problem oriented charting Chief Complaint  Patient presents with  . Diabetes    6 month follow up     This visit occurred during the SARS-CoV-2 public health emergency.  Safety protocols were in place, including screening questions prior to the visit, additional usage of staff PPE, and extensive cleaning of exam room while observing appropriate contact time as indicated for disinfecting solutions.   Past Medical History-  Patient Active Problem List   Diagnosis Date Noted  . Diabetes mellitus type II, controlled ([place]) 05/10/2013    Priority: High  . Headache, unspecified headache type 06/03/2017    Priority: Medium  . History of adenomatous polyp of colon 07/25/2016    Priority: Medium  . Hyperlipidemia associated with type 2 diabetes mellitus ([place])     Priority: Medium  . Hypertension associated with diabetes ([place]) 05/10/2013    Priority: Medium  . Palpitations 05/10/2013    Priority: Medium  . Osteoarthritis, hand 12/16/2014    Priority: Low  . Allergic rhinitis 07/24/2014    Priority: Low  . Obesity, Class I, BMI 30-34.9     Priority: Low  . Dizzy spells 08/08/2013    Priority: Low  . Upper airway cough syndrome 01/22/2016    Medications- reviewed and updated Current Outpatient Medications  Medication Sig Dispense Refill  . amLODipine (NORVASC) 2.5 MG tablet TAKE 1 TABLET(2.5 MG) BY MOUTH DAILY 90 tablet 3  . Ascorbic Acid (VITAMIN C PO) Take 1 tablet by mouth daily.    Marland Kitchen BIOTIN PO Take 1 tablet by mouth daily.    . Cholecalciferol (VITAMIN D3 PO) Take 1 capsule by mouth daily.    . diphenhydrAMINE HCl, Sleep, (UNISOM SLEEPGELS) 50 MG CAPS Take 50 mg by mouth at bedtime.     Marland Kitchen ELDERBERRY PO Take by mouth.    Marland Kitchen glucose blood (ACCU-CHEK GUIDE) test strip Check blood sugar three times daily (Patient taking differently:  Check blood sugar three times daily. Patient states that she checks her blood sugar once in a while) 100 each 11  . Lancets (ACCU-CHEK MULTICLIX) lancets Check blood sugar three times daily 100 each 11  . metFORMIN (GLUCOPHAGE) 500 MG tablet TAKE 1 TABLET BY MOUTH TWICE DAILY WITH A MEAL (Patient taking differently: 500 mg daily. ) 180 tablet 1  . metoprolol succinate (TOPROL-XL) 25 MG 24 hr tablet Take 0.5 tablets (12.5 mg total) by mouth daily. 45 tablet 3  . Multiple Vitamin (MULTIVITAMIN WITH MINERALS) TABS tablet Take 1 tablet by mouth daily.    . rosuvastatin (CRESTOR) 5 MG tablet Take 1 tablet (5 mg total) by mouth once a week. 13 tablet 3  . vitamin E 1000 UNIT capsule Take 1,000 Units by mouth daily.     No current facility-administered medications for this visit.     Objective:  BP 130/86   Pulse 75   Temp 98.3 F (36.8 C) (Temporal)   Ht 5\' 3"  (1.6 m)   Wt 161 lb 12.8 oz (73.4 kg)   LMP  (LMP Unknown)   SpO2 95%   BMI 28.66 kg/m  Gen: NAD, resting comfortably CV: RRR no murmurs rubs or gallops Lungs: CTAB no crackles, wheeze, rhonchi Abdomen: soft/nontender/nondistended/normal bowel sounds.  Ext: no edema Skin: warm, dry    Assessment and Plan   # social update- she reports son dealing with depression  and that has taken a toll on her (see yellow note). She is checking on him regularly.   # Diabetes S: compliant with metformin 500mg  once daily in the evening.  She was getting loose stool with even extra half tablet so has not been on that. In past- Was having diarrhea on higher doses.   Down 8 lbs from last visit- trying to limit intake Lab Results  Component Value Date   HGBA1C 7.6 (H) 04/08/2019  A/P:hopefully controlled update a1c with labs today.  - if not controlled consider metformin extended release instead   # insomnia S:insomnia worse since son's situation started but had ongoing issues prior to that. Unisom helps some. Does not want to try trazodone at  this time  A/P: mild poor control- continue unisom as needed   Hyperlipidemia  S: Compliant with  rosuvastatin 5 mg once a week.   Lab Results  Component Value Date   CHOL 230 (H) 04/08/2019   HDL 45.40 04/08/2019   LDLDIRECT 110.0 04/08/2019   TRIG 363.0 (H) 04/08/2019   CHOLHDL 5 04/08/2019  A/P: lipids above goal wants to work on lifestyle to control better.    Hypertension S:compliant with amlodipine 2.5 mg once a day as well as metoprolol 12.5mg  extended release.  She feels like beets are helping her  Stopped HCTZ due to potentially causing gout.  Stopped ACE inhibitor in the past due to cough. A/P:  Stable. Continue current medications.    Headache, unspecified headache type S: still getting some stress headaches mainly on the left side. advil or tylenol helps.  Had negative temporal artery biopsy a few years ago.  No vision loss reported  Better lately but with high stress still recurs- work can be very stressful.  A/P: thankful these have improved- continue to monitor.    Recommended follow up: Return in about 4 months (around 05/13/2020) for physical or sooner if needed.  Lab/Order associations:   ICD-10-CM   1. Hypertension associated with diabetes ([place])  F16.38 COMPLETE METABOLIC PANEL WITH GFR   I15.2   2. Controlled type 2 diabetes mellitus without complication, without long-term current use of insulin (HCC)  E11.9 Hemoglobin A1c    COMPLETE METABOLIC PANEL WITH GFR  3. Hyperlipidemia associated with type 2 diabetes mellitus ([place])  E11.69    E78.5   4. Postmenopausal  Z78.0 DG Bone Density   Meds ordered this encounter  Medications  . amLODipine (NORVASC) 2.5 MG tablet    Sig: TAKE 1 TABLET(2.5 MG) BY MOUTH DAILY    Dispense:  90 tablet    Refill:  3   Return precautions advised.  Garret Reddish, MD

## 2020-01-13 NOTE — Patient Instructions (Addendum)
Health Maintenance Due  Topic Date Due  . COVID-19 Vaccine (1)- declines today Never done  . INFLUENZA VACCINE - high dose flu shot 09/22/2019  . DEXA SCAN Schedule your bone density test at check out desk. You may also call directly to X-ray at [phone] to schedule an appointment that is convenient for you.  - located [address] across the street from [facility] - in the basement - you do need an appointment for the bone density tests.    Never done  . PNA vac Low Risk Adult (1 of 2 - PCV13)- I want to wait and do pneumovax 5 years from your 2017 immunization 01/11/2020    Please stop by lab before you go If you have mychart- we will send your results within 3 business days of [place] receiving them.  If you do not have mychart- we will call you about results within 5 business days of [place] receiving them.  *please note we are currently using [facility] which has a longer processing time than [place] typically so labs may not come back as quickly as in the past *please also note that you will see labs on mychart as soon as they post. I will later go in and write notes on them- will say "notes from Dr. Yong Channel"

## 2020-01-14 ENCOUNTER — Encounter: Payer: Self-pay | Admitting: Family Medicine

## 2020-01-14 ENCOUNTER — Ambulatory Visit: Payer: Federal, State, Local not specified - PPO | Admitting: Family Medicine

## 2020-01-14 ENCOUNTER — Other Ambulatory Visit: Payer: Self-pay

## 2020-01-14 VITALS — BP 130/86 | HR 75 | Temp 98.3°F | Ht 63.0 in | Wt 161.8 lb

## 2020-01-14 DIAGNOSIS — E119 Type 2 diabetes mellitus without complications: Secondary | ICD-10-CM

## 2020-01-14 DIAGNOSIS — E1169 Type 2 diabetes mellitus with other specified complication: Secondary | ICD-10-CM | POA: Diagnosis not present

## 2020-01-14 DIAGNOSIS — I152 Hypertension secondary to endocrine disorders: Secondary | ICD-10-CM

## 2020-01-14 DIAGNOSIS — E785 Hyperlipidemia, unspecified: Secondary | ICD-10-CM

## 2020-01-14 DIAGNOSIS — Z780 Asymptomatic menopausal state: Secondary | ICD-10-CM | POA: Diagnosis not present

## 2020-01-14 DIAGNOSIS — E1159 Type 2 diabetes mellitus with other circulatory complications: Secondary | ICD-10-CM | POA: Diagnosis not present

## 2020-01-14 MED ORDER — AMLODIPINE BESYLATE 2.5 MG PO TABS: ORAL_TABLET | ORAL | 3 refills | Status: DC

## 2020-01-15 LAB — COMPLETE METABOLIC PANEL WITH GFR
AG Ratio: 1.3 (calc) (ref 1.0–2.5)
ALT: 23 U/L (ref 6–29)
AST: 18 U/L (ref 10–35)
Albumin: 4.5 g/dL (ref 3.6–5.1)
Alkaline phosphatase (APISO): 116 U/L (ref 37–153)
BUN: 16 mg/dL (ref 7–25)
CO2: 28 mmol/L (ref 20–32)
Calcium: 10 mg/dL (ref 8.6–10.4)
Chloride: 99 mmol/L (ref 98–110)
Creat: 0.79 mg/dL (ref 0.50–0.99)
GFR, Est African American: 91 mL/min/{1.73_m2} (ref >=60)
GFR, Est Non African American: 79 mL/min/{1.73_m2} (ref >=60)
Globulin: 3.5 g/dL (calc) (ref 1.9–3.7)
Glucose, Bld: 118 mg/dL — ABNORMAL HIGH (ref 65–99)
Potassium: 3.8 mmol/L (ref 3.5–5.3)
Sodium: 140 mmol/L (ref 135–146)
Total Bilirubin: 0.6 mg/dL (ref 0.2–1.2)
Total Protein: 8 g/dL (ref 6.1–8.1)

## 2020-01-15 LAB — HEMOGLOBIN A1C
Hgb A1c MFr Bld: 7.2 % of total Hgb — ABNORMAL HIGH (ref <5.7)
Mean Plasma Glucose: 160 (calc)
eAG (mmol/L): 8.9 (calc)

## 2020-02-03 ENCOUNTER — Other Ambulatory Visit: Payer: Self-pay | Admitting: Family Medicine

## 2020-02-24 ENCOUNTER — Telehealth: Payer: Self-pay

## 2020-02-24 MED ORDER — DICLOFENAC SODIUM 75 MG PO TBEC: 75.0000 mg | DELAYED_RELEASE_TABLET | Freq: Two times a day (BID) | ORAL | 3 refills | Status: DC

## 2020-02-24 NOTE — Telephone Encounter (Signed)
Yes thanks may refill.   Can raise blood pressure so reasonable to have her monitor this at home

## 2020-02-24 NOTE — Telephone Encounter (Signed)
  LAST APPOINTMENT DATE: 01/14/2020   NEXT APPOINTMENT DATE:@5 /06/2020  MEDICATION: DICLOFENAC 75 MG    PHARMACY: [id number] - [place], [place] - [address] AT [facility] OF [address]  Comments: Patient is asking for this to be sent in today, states she is in a lot of pain.   Please advise

## 2020-02-24 NOTE — Telephone Encounter (Signed)
Not on current med list, ok to fill? 

## 2020-02-24 NOTE — Addendum Note (Signed)
Addended by: Lieutenant Diego A on: 02/24/2020 04:19 PM   Modules accepted: Orders

## 2020-03-16 ENCOUNTER — Other Ambulatory Visit: Payer: Federal, State, Local not specified - PPO

## 2020-03-16 ENCOUNTER — Encounter: Payer: Self-pay | Admitting: Physician Assistant

## 2020-03-16 ENCOUNTER — Telehealth (INDEPENDENT_AMBULATORY_CARE_PROVIDER_SITE_OTHER): Payer: Federal, State, Local not specified - PPO | Admitting: Physician Assistant

## 2020-03-16 VITALS — Ht 63.0 in | Wt 164.0 lb

## 2020-03-16 DIAGNOSIS — Z2821 Immunization not carried out because of patient refusal: Secondary | ICD-10-CM | POA: Diagnosis not present

## 2020-03-16 DIAGNOSIS — Z20822 Contact with and (suspected) exposure to covid-19: Secondary | ICD-10-CM

## 2020-03-16 DIAGNOSIS — Z28310 Unvaccinated for covid-19: Secondary | ICD-10-CM | POA: Insufficient documentation

## 2020-03-16 MED ORDER — BENZONATATE 100 MG PO CAPS: 100.0000 mg | ORAL_CAPSULE | Freq: Three times a day (TID) | ORAL | 1 refills | Status: DC | PRN

## 2020-03-16 NOTE — Progress Notes (Signed)
Virtual Visit via Video   I connected with Dana Mcmahon on 03/16/20 at 12:30 PM EST by a video enabled telemedicine application and verified that I am speaking with the correct person using two identifiers. Location patient: Home Location provider: [facility], Office Persons participating in the virtual visit: [place], Buen PA-C, Anselmo Pickler, LPN   I discussed the limitations of evaluation and management by telemedicine and the availability of in person appointments. The patient expressed understanding and agreed to proceed.  I acted as a Education administrator for [employer], PA-C [employer], LPN   Subjective:   HPI:   Patient is requesting evaluation for possible COVID-19.  Symptom onset: 03/09/2020  Travel/contacts: No  Vaccination status: No COVID-19 vaccine; did get Flu tested   Testing results: None  Patient endorses the following symptoms: 03/09/20 nausea, subjective fever, chills x 2 days 03/11/20 productive cough and chest pain with coughing, fatigue, weakness  --> cough is not getting better/worse; has plateaued at this point; denies hx of asthma/COPD --> weakness has resolved but has going on fatigue  Patient denies the following symptoms: Headache, SOB, confusion, inability to take PO's, diarrhea  Treatments tried: Ibuprofen, Robitussin, steaming, elderberry   Patient risk factors: Current [id number] risk of complications score: 3 Smoking status: Irem Autery  reports that she has never smoked. She has never used smokeless tobacco. If female, currently pregnant? []   Yes [x]   No  ROS: See pertinent positives and negatives per HPI.  Patient Active Problem List   Diagnosis Date Noted  . COVID-19 vaccine series declined 03/16/2020  . Headache, unspecified headache type 06/03/2017  . History of adenomatous polyp of colon 07/25/2016  . Upper airway cough syndrome 01/22/2016  . Osteoarthritis, hand 12/16/2014  . Allergic  rhinitis 07/24/2014  . Hyperlipidemia associated with type 2 diabetes mellitus ([place])   . Obesity, Class I, BMI 30-34.9   . Dizzy spells 08/08/2013  . Diabetes mellitus type II, controlled ([place]) 05/10/2013  . Hypertension associated with diabetes ([place]) 05/10/2013  . Palpitations 05/10/2013    Social History   Tobacco Use  . Smoking status: Never Smoker  . Smokeless tobacco: Never Used  Substance Use Topics  . Alcohol use: No    Current Outpatient Medications:  .  amLODipine (NORVASC) 2.5 MG tablet, TAKE 1 TABLET(2.5 MG) BY MOUTH DAILY, Disp: 90 tablet, Rfl: 3 .  Ascorbic Acid (VITAMIN C PO), Take 1 tablet by mouth daily., Disp: , Rfl:  .  benzonatate (TESSALON PERLES) 100 MG capsule, Take 1 capsule (100 mg total) by mouth 3 (three) times daily as needed for cough., Disp: 30 capsule, Rfl: 1 .  BIOTIN PO, Take 1 tablet by mouth daily., Disp: , Rfl:  .  Cholecalciferol (VITAMIN D3 PO), Take 1 capsule by mouth daily., Disp: , Rfl:  .  diphenhydrAMINE HCl, Sleep, 50 MG CAPS, Take 50 mg by mouth at bedtime. , Disp: , Rfl:  .  ELDERBERRY PO, Take by mouth., Disp: , Rfl:  .  glucose blood (ACCU-CHEK GUIDE) test strip, Check blood sugar three times daily (Patient taking differently: Check blood sugar three times daily. Patient states that she checks her blood sugar once in a while), Disp: 100 each, Rfl: 11 .  Lancets (ACCU-CHEK MULTICLIX) lancets, Check blood sugar three times daily, Disp: 100 each, Rfl: 11 .  metFORMIN (GLUCOPHAGE) 500 MG tablet, TAKE 1 TABLET BY MOUTH TWICE DAILY WITH A MEAL (Patient taking differently: 500 mg daily.), Disp: 180 tablet, Rfl:  1 .  metoprolol succinate (TOPROL-XL) 25 MG 24 hr tablet, Take 0.5 tablets (12.5 mg total) by mouth daily., Disp: 45 tablet, Rfl: 3 .  Multiple Vitamin (MULTIVITAMIN WITH MINERALS) TABS tablet, Take 1 tablet by mouth daily., Disp: , Rfl:  .  rosuvastatin (CRESTOR) 5 MG tablet, TAKE 1 TABLET(5 MG) BY MOUTH 1 TIME A WEEK, Disp: 13 tablet,  Rfl: 3 .  vitamin E 1000 UNIT capsule, Take 1,000 Units by mouth daily., Disp: , Rfl:  .  diclofenac (VOLTAREN) 75 MG EC tablet, Take 1 tablet (75 mg total) by mouth 2 (two) times daily. (Patient not taking: Reported on 03/16/2020), Disp: 60 tablet, Rfl: 3  Allergies  Allergen Reactions  . Codeine Nausea Only  . Penicillins Nausea And Vomiting, Swelling and Other (See Comments)    Has patient had a PCN reaction causing immediate rash, facial/tongue/throat swelling, SOB or lightheadedness with hypotension: Yes Has patient had a PCN reaction causing severe rash involving mucus membranes or skin necrosis: No Has patient had a PCN reaction that required hospitalization: No Has patient had a PCN reaction occurring within the last 10 years: No If all of the above answers are "NO", then may proceed with Cephalosporin use.   . Latex Rash    Objective:   VITALS: Per patient if applicable, see vitals. GENERAL: Alert, appears well and in no acute distress. HEENT: Atraumatic, conjunctiva clear, no obvious abnormalities on inspection of external nose and ears. NECK: Normal movements of the head and neck. CARDIOPULMONARY: No increased WOB. Speaking in clear sentences. I:E ratio WNL.  MS: Moves all visible extremities without noticeable abnormality. PSYCH: Pleasant and cooperative, well-groomed. Speech normal rate and rhythm. Affect is appropriate. Insight and judgement are appropriate. Attention is focused, linear, and appropriate.  NEURO: CN grossly intact. Oriented as arrived to appointment on time with no prompting. Moves both UE equally.  SKIN: No obvious lesions, wounds, erythema, or cyanosis noted on face or hands.  Assessment and Plan:   Enyah was seen today for covid symptoms.  Diagnoses and all orders for this visit:  Suspected COVID-19 virus infection  COVID-19 vaccine series declined  Other orders -     benzonatate (TESSALON PERLES) 100 MG capsule; Take 1 capsule (100 mg total) by  mouth 3 (three) times daily as needed for cough.   No red flags on discussion, patient is not in any obvious distress during our visit.  She is agreeable to COVID-19 testing at this time -- she was contacted to schedule this for this afternoon. Discussed progression of most viral illnesses, and recommended supportive care at this point in time.   I did however prescrbie her tessalon perles for her cough. I offered albuterol prn but she declined. She adamantly refuses COVID-19 vaccination. She would be interested in learning more about COVID treatments if eligible, I did discuss with her that she needs to have a positive test to qualify for this.  Discussed over the counter supportive care options, including Tylenol 500 mg q 8 hours, with recommendations to push fluids and rest. Reviewed return precautions including new/worsening fever, SOB, new/worsening cough, sudden onset changes of symptoms. Recommended need to self-quarantine and practice social distancing until symptoms resolve. I recommend that patient follow-up if symptoms worsen or persist despite treatment x 7-10 days, sooner if needed.  I discussed the assessment and treatment plan with the patient. The patient was provided an opportunity to ask questions and all were answered. The patient agreed with the plan and demonstrated an  understanding of the instructions.   The patient was advised to call back or seek an in-person evaluation if the symptoms worsen or if the condition fails to improve as anticipated.   CMA or LPN served as scribe during this visit. History, Physical, and Plan performed by medical provider. The above documentation has been reviewed and is accurate and complete.  [place], [place] 03/16/2020

## 2020-03-17 LAB — NOVEL CORONAVIRUS, NAA: SARS-CoV-2, NAA: DETECTED — AB

## 2020-03-17 LAB — SARS-COV-2, NAA 2 DAY TAT

## 2020-03-18 ENCOUNTER — Other Ambulatory Visit: Payer: Self-pay | Admitting: Physician Assistant

## 2020-03-18 ENCOUNTER — Telehealth: Payer: Self-pay

## 2020-03-18 DIAGNOSIS — U071 COVID-19: Secondary | ICD-10-CM

## 2020-03-18 NOTE — Telephone Encounter (Signed)
Called to discuss with patient about COVID-19 symptoms and the use of one of the available treatments for those with mild to moderate Covid symptoms and at a high risk of hospitalization.  Pt appears to qualify for outpatient treatment due to co-morbid conditions and/or a member of an at-risk group in accordance with the FDA Emergency Use Authorization.    Symptom onset: 03/09/20 Vaccinated: No Booster? No Immunocompromised? No Qualifiers: DM,HTN  Unable to reach pt - Reached pt. Pt. Is out of 7 day window for treatment.Will call PCP as needed.  Dana Mcmahon

## 2020-03-18 NOTE — Telephone Encounter (Signed)
Please see message. °

## 2020-03-18 NOTE — Telephone Encounter (Signed)
Referral placed.  They will contact her if she is eligible.

## 2020-03-18 NOTE — Telephone Encounter (Signed)
Pt called back to let us know she is Covid positive. She is asking if she can be referred to the antibody infusion.

## 2020-03-18 NOTE — Telephone Encounter (Signed)
Spoke to pt told her [facility] has placed the referral and they will contact you if you are eligible. Pt verbalized understanding and said they have already contacted her. Told her okay.

## 2020-03-25 ENCOUNTER — Other Ambulatory Visit: Payer: Self-pay

## 2020-03-25 MED ORDER — METOPROLOL SUCCINATE ER 25 MG PO TB24
12.5000 mg | ORAL_TABLET | Freq: Every day | ORAL | 0 refills | Status: DC
Start: 2020-03-25 — End: 2020-04-09

## 2020-03-26 ENCOUNTER — Encounter: Payer: Self-pay | Admitting: Family Medicine

## 2020-03-26 ENCOUNTER — Ambulatory Visit (INDEPENDENT_AMBULATORY_CARE_PROVIDER_SITE_OTHER): Payer: Federal, State, Local not specified - PPO | Admitting: Family Medicine

## 2020-03-26 ENCOUNTER — Other Ambulatory Visit: Payer: Federal, State, Local not specified - PPO

## 2020-03-26 VITALS — BP 132/88 | HR 73

## 2020-03-26 DIAGNOSIS — Z20822 Contact with and (suspected) exposure to covid-19: Secondary | ICD-10-CM | POA: Diagnosis not present

## 2020-03-26 DIAGNOSIS — E1159 Type 2 diabetes mellitus with other circulatory complications: Secondary | ICD-10-CM

## 2020-03-26 DIAGNOSIS — I152 Hypertension secondary to endocrine disorders: Secondary | ICD-10-CM

## 2020-03-26 DIAGNOSIS — U071 COVID-19: Secondary | ICD-10-CM

## 2020-03-26 MED ORDER — GUAIFENESIN ER 600 MG PO TB12: 600.0000 mg | ORAL_TABLET | Freq: Two times a day (BID) | ORAL | 0 refills | Status: DC

## 2020-03-26 NOTE — Progress Notes (Signed)
Phone [phone] In person visit   Subjective:   Dana Mcmahon is a 65 y.o. year old very pleasant female patient who presents for/with See problem oriented charting Chief Complaint  Patient presents with  . Covid Positive   This visit occurred during the SARS-CoV-2 public health emergency.  Safety protocols were in place, including screening questions prior to the visit, additional usage of staff PPE, and extensive cleaning of exam room while observing appropriate contact time as indicated for disinfecting solutions.   Past Medical History-  Patient Active Problem List   Diagnosis Date Noted  . Diabetes mellitus type II, controlled ([place]) 05/10/2013    Priority: High  . Headache, unspecified headache type 06/03/2017    Priority: Medium  . History of adenomatous polyp of colon 07/25/2016    Priority: Medium  . Hyperlipidemia associated with type 2 diabetes mellitus ([place])     Priority: Medium  . Hypertension associated with diabetes ([place]) 05/10/2013    Priority: Medium  . Palpitations 05/10/2013    Priority: Medium  . Osteoarthritis, hand 12/16/2014    Priority: Low  . Allergic rhinitis 07/24/2014    Priority: Low  . Obesity, Class I, BMI 30-34.9     Priority: Low  . Dizzy spells 08/08/2013    Priority: Low  . COVID-19 vaccine series declined 03/16/2020  . Upper airway cough syndrome 01/22/2016    Medications- reviewed and updated Current Outpatient Medications  Medication Sig Dispense Refill  . guaiFENesin (MUCINEX) 600 MG 12 hr tablet Take 1 tablet (600 mg total) by mouth 2 (two) times daily. 10 tablet 0  . amLODipine (NORVASC) 2.5 MG tablet TAKE 1 TABLET(2.5 MG) BY MOUTH DAILY 90 tablet 3  . Ascorbic Acid (VITAMIN C PO) Take 1 tablet by mouth daily.    . benzonatate (TESSALON PERLES) 100 MG capsule Take 1 capsule (100 mg total) by mouth 3 (three) times daily as needed for cough. 30 capsule 1  . BIOTIN PO Take 1 tablet by mouth daily.    . Cholecalciferol  (VITAMIN D3 PO) Take 1 capsule by mouth daily.    . diclofenac (VOLTAREN) 75 MG EC tablet Take 1 tablet (75 mg total) by mouth 2 (two) times daily. (Patient not taking: Reported on 03/16/2020) 60 tablet 3  . diphenhydrAMINE HCl, Sleep, 50 MG CAPS Take 50 mg by mouth at bedtime.     Marland Kitchen ELDERBERRY PO Take by mouth.    Marland Kitchen glucose blood (ACCU-CHEK GUIDE) test strip Check blood sugar three times daily (Patient taking differently: Check blood sugar three times daily. Patient states that she checks her blood sugar once in a while) 100 each 11  . Lancets (ACCU-CHEK MULTICLIX) lancets Check blood sugar three times daily 100 each 11  . metFORMIN (GLUCOPHAGE) 500 MG tablet TAKE 1 TABLET BY MOUTH TWICE DAILY WITH A MEAL (Patient taking differently: 500 mg daily.) 180 tablet 1  . metoprolol succinate (TOPROL-XL) 25 MG 24 hr tablet Take 0.5 tablets (12.5 mg total) by mouth daily. Please make overdue appt with Dr. Acie Fredrickson before anymore refills. Thank you 1st attempt 15 tablet 0  . Multiple Vitamin (MULTIVITAMIN WITH MINERALS) TABS tablet Take 1 tablet by mouth daily.    . rosuvastatin (CRESTOR) 5 MG tablet TAKE 1 TABLET(5 MG) BY MOUTH 1 TIME A WEEK 13 tablet 3  . vitamin E 1000 UNIT capsule Take 1,000 Units by mouth daily.     No current facility-administered medications for this visit.     Objective:  BP 132/88  Pulse 73   LMP  (LMP Unknown)   SpO2 97%  Gen: NAD, resting comfortably other than intermittent cough and at times coughign spells TM normal, erythematous nares, oropharynx largely normal CV: RRR no murmurs rubs or gallops Lungs: CTAB no crackles, wheeze, rhonchi Ext: no edema Skin: warm, dry     Assessment and Plan   #Covid-19 S: On March 09, 2020 patient developed nausea, subjective fever and chills for 2 days.  On March 11, 2020 developed productive cough and chest pain with coughing, fatigue, weakness.  Had plateaued by time of visit with Inda Coke on March 16, 2020.  She had  ongoing fatigue at that time.  She had tried ibuprofen, Robitussin, steaming, elderberry.Of note she is not vaccinated and has declined vaccination in the past on discussions.   She was given Ladona Ridgel for cough.  They discussed possible albuterol treatment but patient wanted to hold off at that time.  Covid Testing was performed at that time and resulted in positive test.   She was told out of window for treatment from outpatient referral.   Today she reports, persistent cough with upper chest congestion/some tenderness with cough. Mainly dry cough. Cough has been main concern as well as chest congestion. Taking vitamin B, c, d, steaming 3x a day. Feels like a deep cough. No fever. Not short of breath. Oxygen levels have been >94% A/P: Patient with covid 19. Has not worsened but also not made subtantial progress in symptoms. Was told too late for outpatient treatments and/or did not qualify. We discussed possible pulmicort though later in illness- she declines. Tessalon has not been very effective- codeine allergy- opted for trial of mucinex. Also discussed phenergan-dextromethorphan. Husband has had good success with mucinex so we will trial that. Declines albuterol trial as well again. She will contact us with new or worsening symptoms but also counseled on potential for long covid.   Before I entered room patient had requested repeat covid testing. We discussed to end self isolation I would liek to see some improvement in her cough/respiratory symptoms for at least 24 hours vs husband can end his as has seen some respiratory improvement.   Of note team will need to log high dose Flu shot in November at last visit- was given but not logged.    Hypertension S:compliant with amlodipine 2.5 mg once a day as well as metoprolol 12.5mg  extended release.    Stopped HCTZ due to potentially causing gout.  Stopped ACE inhibitor in the past due to cough. A/P:  Initial BP high normal. She asked that I  recheck and was slightly higher- she had just had a coughing fit though and we discussed that could be cause. In general BP has been well controlled though high normal at times so we opted to monitor on current meds BP Readings from Last 3 Encounters:  03/26/20 132/88  01/14/20 130/86  08/08/19 138/68   Recommended follow up:  As needed for acute concerns Future Appointments  Date Time Provider [place]  06/25/2020 10:40 AM Marin Olp, MD [facility]    Lab/Order associations:   ICD-10-CM   1. COVID-19  U07.1   2. Hypertension associated with diabetes ([place])  E11.59    I15.2    Meds ordered this encounter  Medications  . guaiFENesin (MUCINEX) 600 MG 12 hr tablet    Sig: Take 1 tablet (600 mg total) by mouth 2 (two) times daily.    Dispense:  10 tablet  Refill:  0    Time Spent: 23 minutes of total time (5:27 PM- 5:45 PM, 7:53-7:58 PM) was spent on the date of the encounter performing the following actions: chart review prior to seeing the patient, obtaining history, performing a medically necessary exam, counseling on the treatment plan, placing orders, and documenting in our EHR.   Return precautions advised.  Garret Reddish, MD

## 2020-03-26 NOTE — Patient Instructions (Addendum)
Stay hydrated at least 60 oz a day  Try mucinex  Hoping you start to turn the corner soon like hubby  Recommended follow up: Let us know if symptoms fail to improve- could try steroid inhaler

## 2020-03-27 LAB — SARS-COV-2, NAA 2 DAY TAT

## 2020-03-27 LAB — NOVEL CORONAVIRUS, NAA: SARS-CoV-2, NAA: DETECTED — AB

## 2020-03-31 ENCOUNTER — Ambulatory Visit: Payer: Federal, State, Local not specified - PPO | Admitting: Family Medicine

## 2020-04-09 ENCOUNTER — Telehealth: Payer: Self-pay | Admitting: Cardiovascular Disease

## 2020-04-09 MED ORDER — METOPROLOL SUCCINATE ER 25 MG PO TB24: 12.5000 mg | ORAL_TABLET | Freq: Every day | ORAL | 1 refills | Status: DC

## 2020-04-09 NOTE — Telephone Encounter (Signed)
Pt's medication was sent to pt's pharmacy as requested. Confirmation received.  °

## 2020-04-09 NOTE — Telephone Encounter (Signed)
*  STAT* If patient is at the pharmacy, call can be transferred to refill team.   1. Which medications need to be refilled? (please list name of each medication and dose if known) metoprolol succinate (TOPROL-XL) 25 MG 24 hr tablet  2. Which pharmacy/location (including street and city if local pharmacy) is medication to be sent to? [facility], [place] - [address] AT [place]  3. Do they need a 30 day or 90 day supply? 30 day   Patient is out of medication. She is scheduled 05/19/2020.

## 2020-04-15 ENCOUNTER — Telehealth: Payer: Self-pay

## 2020-04-15 MED ORDER — PROMETHAZINE-DM 6.25-15 MG/5ML PO SYRP: 5.0000 mL | ORAL_SOLUTION | Freq: Every evening | ORAL | 0 refills | Status: DC | PRN

## 2020-04-15 NOTE — Telephone Encounter (Signed)
See below, pt was seen on 03/26/20.

## 2020-04-15 NOTE — Telephone Encounter (Signed)
Patient called in and stated that she still has a cough that still hasn't gone away since her last appt. Patient is still taking musnix and she said its not helping at all anymore she said she going to stop taking it. Patient wants to know if something could be sent in for either to help her sleep or for the cough. Patient would like a call back.

## 2020-04-15 NOTE — Telephone Encounter (Signed)
I sent in combo cough/nausea medicine that reallyhelps with sleep as well typically. Please make sure she is at least improving some. Tell her if not improving within a week or two to call us back- may use prednisone for post viral cough- would want her to get another a1c first to make sure reasonably safe (would want a1c 7.5 or less)  Lab Results  Component Value Date   HGBA1C 7.2 (H) 01/14/2020

## 2020-04-16 NOTE — Telephone Encounter (Signed)
Called and spoke with pt and below message given. 

## 2020-04-30 NOTE — Telephone Encounter (Signed)
From your message 2 weeks ago: I sent in combo cough/nausea medicine that reallyhelps with sleep as well typically. Please make sure she is at least improving some. Tell her if not improving within a week or two to call us back- may use prednisone for post viral cough- would want her to get another a1c first to make sure reasonably safe (would want a1c 7.5 or less). Still willing to send in prednisone even though a recent a1c hasn't been done or does she need to come in office to discuss?

## 2020-04-30 NOTE — Telephone Encounter (Signed)
We need to at least get an A1c-I am fine with this being ordered for a lab only visit.  Or if she prefers we can do a point-of-care A1c during visit with me and then decide on prednisone together

## 2020-04-30 NOTE — Telephone Encounter (Signed)
Patients cough has not improved and would like a return call to discuss options or what she needs to do next. Please advise

## 2020-05-01 ENCOUNTER — Other Ambulatory Visit: Payer: Self-pay

## 2020-05-01 DIAGNOSIS — E119 Type 2 diabetes mellitus without complications: Secondary | ICD-10-CM

## 2020-05-01 NOTE — Telephone Encounter (Signed)
Patient states that when she was taking the promethazine it was making her blood pressure high so she only too it for a week. She is willing to come in and get labs only and then you call her back about what we're going to do about her cough. She states that today she hasn't been coughing as much but she still has a persistent cough. Patient states that she is feels very exhausted due to all the coughing.

## 2020-05-01 NOTE — Telephone Encounter (Signed)
My plan was to consider prednisone if her A1c is reasonably well controlled.  Prednisone is an anti-inflammatory that I often use for  postviral cough

## 2020-05-04 NOTE — Telephone Encounter (Signed)
Patient is okay with trying the Prednisone after her lab work comes back. Her appointment is tomorrow at [facility]

## 2020-05-05 ENCOUNTER — Other Ambulatory Visit: Payer: Federal, State, Local not specified - PPO

## 2020-05-06 NOTE — Telephone Encounter (Signed)
She's coming in for lab today at 4pm are you going to send the prednisone in or do you need me to ?

## 2020-05-06 NOTE — Telephone Encounter (Signed)
Patient still needs to complete labs

## 2020-05-07 NOTE — Telephone Encounter (Signed)
We need the lab results first

## 2020-05-07 NOTE — Telephone Encounter (Signed)
Patient is aware that Dr. Yong Channel will send in the prednisone once the labs are back.

## 2020-05-07 NOTE — Telephone Encounter (Signed)
If she would like to try the prednisone to see if this decreases inflammation and helps with postnasal drip then she needs to get the labs

## 2020-05-07 NOTE — Telephone Encounter (Signed)
Patient is aware of Dr Ronney Lion comments. She is having her labs done Monday at 3:30

## 2020-05-07 NOTE — Telephone Encounter (Signed)
Patient states that her cough is coming from the post nasal drip in the back of her throat and it's making her cough. She states that she don't think she need any labs done because she doesn't think it's an infection.

## 2020-05-08 ENCOUNTER — Other Ambulatory Visit: Payer: Federal, State, Local not specified - PPO

## 2020-05-11 ENCOUNTER — Other Ambulatory Visit: Payer: Self-pay

## 2020-05-11 ENCOUNTER — Other Ambulatory Visit (INDEPENDENT_AMBULATORY_CARE_PROVIDER_SITE_OTHER): Payer: Federal, State, Local not specified - PPO

## 2020-05-11 DIAGNOSIS — E119 Type 2 diabetes mellitus without complications: Secondary | ICD-10-CM

## 2020-05-12 ENCOUNTER — Other Ambulatory Visit: Payer: Self-pay | Admitting: Family Medicine

## 2020-05-12 LAB — HEMOGLOBIN A1C: Hgb A1c MFr Bld: 7.5 % — ABNORMAL HIGH (ref 4.6–6.5)

## 2020-05-12 MED ORDER — METFORMIN HCL ER (OSM) 500 MG PO TB24: 500.0000 mg | ORAL_TABLET | Freq: Two times a day (BID) | ORAL | 3 refills | Status: DC

## 2020-05-12 MED ORDER — PREDNISONE 20 MG PO TABS: ORAL_TABLET | ORAL | 0 refills | Status: DC

## 2020-05-19 ENCOUNTER — Ambulatory Visit: Payer: Federal, State, Local not specified - PPO | Admitting: Cardiovascular Disease

## 2020-05-27 ENCOUNTER — Telehealth: Payer: Self-pay

## 2020-05-27 DIAGNOSIS — R053 Chronic cough: Secondary | ICD-10-CM

## 2020-05-27 NOTE — Telephone Encounter (Signed)
From your note when pt saw you on 03/26/20 "Recommended follow up: Let us know if symptoms fail to improve- could try steroid inhaler"

## 2020-05-27 NOTE — Telephone Encounter (Signed)
Pt called stating her cough has not improved after taking the prednisone. Please advise.

## 2020-05-28 MED ORDER — DOXYCYCLINE HYCLATE 100 MG PO TABS: 100.0000 mg | ORAL_TABLET | Freq: Two times a day (BID) | ORAL | 0 refills | Status: AC

## 2020-05-28 NOTE — Telephone Encounter (Signed)
Called and spoke with pt and Pt states she does not think it is lung related, she states she has told Dr. Yong Channel and Rosanne Sack this before that it is coming from the drainage touching the back of her throat and its coming from a sinus infection. She does not feel the need to have a chest xray b/c its not in her chest. As she was talking to me she was saying she could feel the drainage and had to stop and cough. She states the prednisone made her tired and she has been telling you that this is a sinus infection.

## 2020-05-28 NOTE — Telephone Encounter (Signed)
I am willing to try a course of antibiotics.  She has an excellent allergy so I will use doxycycline.  She needs to avoid prolonged sun exposure with this.  If cough persists then want to get a chest x-ray

## 2020-05-28 NOTE — Telephone Encounter (Signed)
Thanks Keba-lets get an updated chest x-ray under chronic cough as our first step.  Since we tried steroids and there was no benefit I am less confident in a steroid inhaler

## 2020-05-28 NOTE — Telephone Encounter (Signed)
Called and spoke with pt and made her aware.

## 2020-06-04 ENCOUNTER — Encounter: Payer: Self-pay | Admitting: Cardiovascular Disease

## 2020-06-04 NOTE — Progress Notes (Signed)
Cardiology Office Note:    Date:  06/05/2020   ID:  Dana Mcmahon, Dana Mcmahon 02/01/1956, MRN [id number]  PCP:  Marin Olp, MD  Cardiologist:  Akylah Hascall   Problem List 1. Palpitations  2.  Diabetes mellitus 3.  Hyperlipidemia   Referring MD: Marin Olp, MD   Chief Complaint  Patient presents with  . PVC  . Palpitations    Prior notes:     Dana Mcmahon is a 65 y.o. female with a hx of palpitations. Feels her Heart racing Associated with chest heaviness Last for several minutes, Different times of the day .   Has woken her up from sleep on occasion Exercises reguarly - walks on the treadmill - ,  No issues while working out, she occasionally symptoms right after working out.  These are associated with some mild lightheadedness.  Similar palpitations in 2016 - Holter showed PACs and PVCs   August 07, 2017:  Dana Mcmahon is seen today for follow up of her PVCs Tried toprol XL.  Has lots of fatigue.   No energy to do much  Not exercisig     March 08, 2019:  Dana Mcmahon is seen today for follow-up of her prior palpitations.  She is had a Holter monitor that showed premature atrial contractions and premature ventricular contractions.  I saw her a year ago.  At that time we had her on low-dose Toprol-XL which caused some fatigue.  June 05, 2020: Dana Mcmahon is seen today for follow up of her palpitatios /PVCs.and PACs BP is slightly elevated.  Has mild leg edema - is on amlodipine  Still eats salt on occasion  Has tried HCTZ but she developed gout in 2015  Encourage more cardio exercise      Past Medical History:  Diagnosis Date  . Allergic rhinitis   . Anxiety   . Arthritis   . DM type 2 (diabetes mellitus, type 2) ([place])   . Heart murmur   . Hyperlipidemia    per old records, pt has refused cholesto-lowering meds  . Hypertension   . Neck pain, chronic    Mild DDD, no signif progression (multiple MRI's: 2001, 2003, 2005, 2007)  . Obesity, Class I, BMI  30-34.9   . 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 ARTERY BIOPSY Left 06/13/2017   Procedure: LEFT TEMPORAL ARTERY BIOPSY;  Surgeon: Johnathan Hausen, MD;  Location: [facility];  Service: General;  Laterality: Left;  . COLONOSCOPY    . none      Current Medications: Current Meds  Medication Sig  . Ascorbic Acid (VITAMIN C PO) Take 1 tablet by mouth daily.  . benzonatate (TESSALON PERLES) 100 MG capsule Take 1 capsule (100 mg total) by mouth 3 (three) times daily as needed for cough.  Marland Kitchen BIOTIN PO Take 1 tablet by mouth daily.  . Cholecalciferol (VITAMIN D3 PO) Take 1 capsule by mouth daily.  . diphenhydrAMINE HCl, Sleep, 50 MG CAPS Take 50 mg by mouth at bedtime.   Marland Kitchen ELDERBERRY PO Take by mouth.  Marland Kitchen glucose blood (ACCU-CHEK GUIDE) test strip Check blood sugar three times daily (Patient taking differently: Check blood sugar three times daily. Patient states that she checks her blood sugar once in a while)  . Lancets (ACCU-CHEK MULTICLIX) lancets Check blood sugar three times daily  . losartan (COZAAR) 50 MG tablet Take 1 tablet (50 mg total) by mouth daily.  . metformin (FORTAMET)  500 MG (OSM) 24 hr tablet Take 1 tablet (500 mg total) by mouth 2 (two) times daily with a meal.  . metoprolol succinate (TOPROL-XL) 25 MG 24 hr tablet Take 0.5 tablets (12.5 mg total) by mouth daily. Please keep upcoming appt with Dr. Acie Fredrickson before anymore refills. Thank you  . Multiple Vitamin (MULTIVITAMIN WITH MINERALS) TABS tablet Take 1 tablet by mouth daily.  . promethazine-dextromethorphan (PROMETHAZINE-DM) 6.25-15 MG/5ML syrup Take 5 mLs by mouth at bedtime as needed for cough.  . rosuvastatin (CRESTOR) 5 MG tablet TAKE 1 TABLET(5 MG) BY MOUTH 1 TIME A WEEK  . vitamin E 1000 UNIT capsule Take 1,000 Units by mouth daily.  . [DISCONTINUED] amLODipine (NORVASC) 2.5 MG tablet TAKE 1 TABLET(2.5 MG) BY MOUTH DAILY  . [DISCONTINUED] diclofenac (VOLTAREN) 75 MG  EC tablet Take 1 tablet (75 mg total) by mouth 2 (two) times daily.  . [DISCONTINUED] guaiFENesin (MUCINEX) 600 MG 12 hr tablet Take 1 tablet (600 mg total) by mouth 2 (two) times daily.  . [DISCONTINUED] predniSONE (DELTASONE) 20 MG tablet Take 1 tablet by mouth daily for 5 days, then 1/2 tablet daily for 2 days     Allergies:   Codeine, Penicillins, and Latex   Social History   Socioeconomic History  . Marital status: Married    Spouse name: Not on file  . Number of children: Not on file  . Years of education: Not on file  . Highest education level: Not on file  Occupational History  . Not on file  Tobacco Use  . Smoking status: Never Smoker  . Smokeless tobacco: Never Used  Vaping Use  . Vaping Use: Never used  Substance and Sexual Activity  . Alcohol use: No  . Drug use: No  . Sexual activity: Not Currently    Partners: Male  Other Topics Concern  . Not on file  Social History Narrative   Married, 4 children (daughter Charisma patient here, husband Patsy [place], son Patsy [place]). 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      Hobbies: time with family, watching news, reading            Social Determinants of Health   Financial Resource Strain: Not on file  Food Insecurity: Not on file  Transportation Needs: Not on file  Physical Activity: Not on file  Stress: Not on file  Social Connections: Not on file     Family History: The patient's family history includes Hypertension in her mother; Stroke in her brother and mother. There is no history of Colon cancer, Esophageal cancer, Rectal cancer, or Stomach cancer.  ROS:   Please see the history of present illness.     All other systems reviewed and are negative.  EKGs/Labs/Other Studies Reviewed:    The following studies were reviewed today:     Recent Labs: 01/14/2020: ALT 23; BUN 16; Creat 0.79; Potassium 3.8; Sodium 140  Recent  Lipid Panel    Component Value Date/Time   CHOL 230 (H) 04/08/2019 1057   TRIG 363.0 (H) 04/08/2019 1057   HDL 45.40 04/08/2019 1057   CHOLHDL 5 04/08/2019 1057   VLDL 72.6 (H) 04/08/2019 1057   LDLDIRECT 110.0 04/08/2019 1057    Physical Exam:     Physical Exam: Blood pressure (!) 144/88, pulse 69, height 5' 3.5" (1.613 m), weight 167 lb 12.8 oz (76.1 kg), SpO2 98 %.  GEN:  Well nourished, well developed in no acute distress HEENT: Normal NECK: No JVD; No carotid bruits LYMPHATICS: No lymphadenopathy CARDIAC: RRR , no murmurs, rubs, gallops RESPIRATORY:  Clear to auscultation without rales, wheezing or rhonchi  ABDOMEN: Soft, non-tender, non-distended MUSCULOSKELETAL:  No edema; No deformity  SKIN: Warm and dry NEUROLOGIC:  Alert and oriented x 3   EKG: June 05, 2020: Normal sinus rhythm.  Nonspecific ST abnormalities. .  ASSESSMENT:    1. Essential hypertension    PLAN:       1.  Palpitations -she is on Toprol-XL.  She still has occasional palpitations.  These typically occur when she is under stress.  I encouraged her to try to reduce the stress in her life.  I also encouraged her to remain hydrated.  2.  Hyperlipidemia.    3.  HTn: Blood pressure remains mildly elevated.  She still eating a little bit of extra salt.  She has developed some leg edema which may be due to her salt intake plus the amlodipine.  We considered HCTZ but I found that she developed gout years ago when she tried HCTZ.  We will try her on losartan 50 mg a day.  We may need to increase it to 100 mg a day.   Medication Adjustments/Labs and Tests Ordered: Current medicines are reviewed at length with the patient today.  Concerns regarding medicines are outlined above.  Orders Placed This Encounter  Procedures  . Basic metabolic panel  . EKG 12-Lead   Meds ordered this encounter  Medications  . losartan (COZAAR) 50 MG tablet    Sig: Take 1 tablet (50 mg total) by mouth daily.     Dispense:  90 tablet    Refill:  3    Signed, Mertie Moores, MD  06/05/2020 9:25 AM    [facility]

## 2020-06-05 ENCOUNTER — Encounter: Payer: Self-pay | Admitting: Cardiovascular Disease

## 2020-06-05 ENCOUNTER — Ambulatory Visit: Payer: Federal, State, Local not specified - PPO | Admitting: Cardiovascular Disease

## 2020-06-05 ENCOUNTER — Other Ambulatory Visit: Payer: Self-pay

## 2020-06-05 VITALS — BP 144/88 | HR 69 | Ht 63.5 in | Wt 167.8 lb

## 2020-06-05 DIAGNOSIS — I152 Hypertension secondary to endocrine disorders: Secondary | ICD-10-CM | POA: Diagnosis not present

## 2020-06-05 DIAGNOSIS — E1159 Type 2 diabetes mellitus with other circulatory complications: Secondary | ICD-10-CM

## 2020-06-05 DIAGNOSIS — I10 Essential (primary) hypertension: Secondary | ICD-10-CM | POA: Diagnosis not present

## 2020-06-05 DIAGNOSIS — E785 Hyperlipidemia, unspecified: Secondary | ICD-10-CM

## 2020-06-05 DIAGNOSIS — E1169 Type 2 diabetes mellitus with other specified complication: Secondary | ICD-10-CM | POA: Diagnosis not present

## 2020-06-05 MED ORDER — LOSARTAN POTASSIUM 50 MG PO TABS: 50.0000 mg | ORAL_TABLET | Freq: Every day | ORAL | 3 refills | Status: DC

## 2020-06-05 NOTE — Patient Instructions (Signed)
Medication Instructions:  Your physician has recommended you make the following change in your medication:   STOP amlodipine START Losartan 50mg  daily  *If you need a refill on your cardiac medications before your next appointment, please call your pharmacy*   Lab Work: BMET Your physician recommends that you return for lab work in: 3 weeks  You will need to FAST for this appointment - nothing to eat or drink after midnight the night before except water.   If you have labs (blood work) drawn today and your tests are completely normal, you will receive your results only by: Marland Kitchen MyChart Message (if you have MyChart) OR . A paper copy in the mail If you have any lab test that is abnormal or we need to change your treatment, we will call you to review the results.   Testing/Procedures: none   Follow-Up: At [facility], you and your health needs are our priority.  As part of our continuing mission to provide you with exceptional heart care, we have created designated Provider Care Teams.  These Care Teams include your primary Cardiologist (physician) and Advanced Practice Providers (APPs -  Physician Assistants and Nurse Practitioners) who all work together to provide you with the care you need, when you need it.  We recommend signing up for the patient portal called "MyChart".  Sign up information is provided on this After Visit Summary.  MyChart is used to connect with patients for Virtual Visits (Telemedicine).  Patients are able to view lab/test results, encounter notes, upcoming appointments, etc.  Non-urgent messages can be sent to your provider as well.   To learn more about what you can do with MyChart, go to [email or web address].    Your next appointment:   1 year(s)  The format for your next appointment:   In Person  Provider:   You may see Mertie Moores, MD or one of the following Advanced Practice Providers on your designated Care Team:    Richardson Dopp, PA-C  [place], [place]

## 2020-06-11 ENCOUNTER — Telehealth: Payer: Self-pay

## 2020-06-11 MED ORDER — METFORMIN HCL ER (OSM) 500 MG PO TB24: 500.0000 mg | ORAL_TABLET | Freq: Two times a day (BID) | ORAL | 3 refills | Status: DC

## 2020-06-11 NOTE — Telephone Encounter (Signed)
PA has been completed on 05/28/20 and another on 06/11/20.

## 2020-06-11 NOTE — Telephone Encounter (Signed)
..   LAST APPOINTMENT DATE: 05/27/2020   NEXT APPOINTMENT DATE:@5 /06/2020  MEDICATION:metformin (FORTAMET) 500 MG (OSM) 24 hr tablet    PHARMACY:[id number] - [place], [place] - [address] AT [address]  Pt is requesting extended release metformin   Please advise

## 2020-06-11 NOTE — Telephone Encounter (Signed)
Rx sent 

## 2020-06-11 NOTE — Telephone Encounter (Signed)
Pharmacy told pt that this prescription needs a PA. Pt is asking we do that soon, since she is almost out of medication.

## 2020-06-18 ENCOUNTER — Telehealth: Payer: Self-pay

## 2020-06-18 NOTE — Telephone Encounter (Signed)
Patient called in stating that she would like a call back states she is tired of playing phone tag about her medication, declined to give any more information. States she would like to someone to give her a call back.

## 2020-06-18 NOTE — Telephone Encounter (Signed)
Called and lm for pt tcb. 

## 2020-06-19 NOTE — Telephone Encounter (Signed)
I was able to contact pt and made aware that we are still trying to get her medication approved, I contacted covermymeds and they said the PA is still under review, pt made aware and states she will contact her insurance company her self.

## 2020-06-24 ENCOUNTER — Other Ambulatory Visit: Payer: Self-pay

## 2020-06-24 ENCOUNTER — Other Ambulatory Visit: Payer: Self-pay | Admitting: Family Medicine

## 2020-06-24 ENCOUNTER — Telehealth: Payer: Self-pay

## 2020-06-24 DIAGNOSIS — R053 Chronic cough: Secondary | ICD-10-CM

## 2020-06-24 MED ORDER — METFORMIN HCL ER 500 MG PO TB24: 500.0000 mg | ORAL_TABLET | Freq: Every day | ORAL | 0 refills | Status: DC

## 2020-06-24 MED ORDER — METOPROLOL SUCCINATE ER 25 MG PO TB24: 12.5000 mg | ORAL_TABLET | Freq: Every day | ORAL | 3 refills | Status: DC

## 2020-06-24 NOTE — Telephone Encounter (Signed)
Called and spoke with pt, pt request referral to pulmonologist due to still having chronic cough. Referral placed and chest xray ordered, also Metformin 500mg  XR sent to pharmacy.

## 2020-06-24 NOTE — Telephone Encounter (Signed)
Patient called stating she has been in discussion with [facility] in regard to a medication.  Patient wants a call back from [place].  Patient would not give me any further information.

## 2020-06-25 ENCOUNTER — Encounter: Payer: Federal, State, Local not specified - PPO | Admitting: Family Medicine

## 2020-06-26 ENCOUNTER — Other Ambulatory Visit: Payer: Federal, State, Local not specified - PPO | Admitting: *Deleted

## 2020-06-26 ENCOUNTER — Other Ambulatory Visit: Payer: Self-pay

## 2020-06-26 ENCOUNTER — Telehealth: Payer: Self-pay | Admitting: Cardiovascular Disease

## 2020-06-26 ENCOUNTER — Telehealth: Payer: Self-pay

## 2020-06-26 DIAGNOSIS — I10 Essential (primary) hypertension: Secondary | ICD-10-CM

## 2020-06-26 MED ORDER — LOSARTAN POTASSIUM 100 MG PO TABS: 100.0000 mg | ORAL_TABLET | Freq: Every day | ORAL | 3 refills | Status: DC

## 2020-06-26 NOTE — Telephone Encounter (Signed)
Team please see if she wants to schedule a follow-up visit to discuss her blood pressure concerns as well as medication side effect concerns

## 2020-06-26 NOTE — Telephone Encounter (Signed)
BP was 154 / 80 with HR of 72  These are much better than the readings she was getting at home.  Will increase the Losartan to 100 mg a day    Pt does not want to increase the losartan

## 2020-06-26 NOTE — Telephone Encounter (Signed)
Patient BP 154/80 HR 72.  She c/o HA.  Dr. Acie Fredrickson reviewed reading and made the following medication change.  Increase losartan to 100 mg PO QD.  Patient initially said she was not going to take medication.  She expressed that she didn't want to take a lot of medication.  She then requested to go back on amlodipine.  She feels that amlodipine controls her BP better.  However, she previously c/o lower extremity swelling with amlodipine.  She then said her legs continue to be swollen.  So it probably wasn't the amlodipine.  I advised her to give the increased dose of losartan a try.  Take losartan for at least a week and check BP daily to see if it improves.  Call our office with readings.  She is agreeable to try losartan.   I made her aware that if she wants to go back on amlodipine she will have to f/u with PCP.

## 2020-06-26 NOTE — Telephone Encounter (Signed)
Pt c/o BP issue: STAT if pt c/o blurred vision, one-sided weakness or slurred speech  1. What are your last 5 BP readings? 197/105 187/100 174/105  2. Are you having any other symptoms (ex. Dizziness, headache, blurred vision, passed out)? Headache, back of head is really hurting and feeling fatigue  3. What is your BP issue? Is high. Patient think its so high because of losartan (COZAAR) 50 MG tablet

## 2020-06-26 NOTE — Telephone Encounter (Signed)
RN returned call to patient regarding increased BP. Patient reports she has been noticing fatigue since starting Losartan on 4/15. Patient reports that over the past week her BP has been running consistently high. Pt reports a headache and neck pain. Denies dizziness, shortness of breath, blurry vision. Patient is coming to have a bmet checked in the office this afternoon. RN discussed with Dr.Nahser who would like patients BP manually checked while she is in the office. Triage nurse to check BP when she arrives. Patient aware.

## 2020-06-27 LAB — BASIC METABOLIC PANEL
BUN/Creatinine Ratio: 15 (ref 12–28)
BUN: 13 mg/dL (ref 8–27)
CO2: 23 mmol/L (ref 20–29)
Calcium: 9.5 mg/dL (ref 8.7–10.3)
Chloride: 99 mmol/L (ref 96–106)
Creatinine, Ser: 0.84 mg/dL (ref 0.57–1.00)
Glucose: 220 mg/dL — ABNORMAL HIGH (ref 65–99)
Potassium: 3.8 mmol/L (ref 3.5–5.2)
Sodium: 141 mmol/L (ref 134–144)
eGFR: 77 mL/min/{1.73_m2} (ref >59)

## 2020-06-29 NOTE — Telephone Encounter (Signed)
Patient is scheduled for a follow up, and advised to call [place] office.

## 2020-07-01 NOTE — Patient Instructions (Addendum)
Health Maintenance Due  Topic Date Due  . MAMMOGRAM Sign release form at checkout.  06/04/2020   Continue losartan and metoprolol. Start amlodipine 2.5 mg before bed  Schedule follow-up sometime in the next 2 to 4 weeks so we can recheck.  I do think your home cuff may be a few points high but likely not more than 10/5 higher-lets target home blood pressure less than 145/90 on average and in office less than 140/90.  May consider changing to valsartan if persistent cough bc this irritates cough center less  Try zyrtec before bed as well   Please stop by x-ray before you go If you have mychart- we will send your results within 3 business days of [place] receiving them.  If you do not have mychart- we will call you about results within 5 business days of [place] receiving them.    Recommended follow up: No follow-ups on file.

## 2020-07-02 ENCOUNTER — Encounter: Payer: Self-pay | Admitting: Family Medicine

## 2020-07-02 ENCOUNTER — Ambulatory Visit: Payer: Federal, State, Local not specified - PPO | Admitting: Family Medicine

## 2020-07-02 ENCOUNTER — Other Ambulatory Visit: Payer: Federal, State, Local not specified - PPO

## 2020-07-02 ENCOUNTER — Ambulatory Visit (INDEPENDENT_AMBULATORY_CARE_PROVIDER_SITE_OTHER): Payer: Federal, State, Local not specified - PPO

## 2020-07-02 ENCOUNTER — Other Ambulatory Visit: Payer: Self-pay

## 2020-07-02 VITALS — BP 154/78 | HR 75 | Temp 98.1°F | Ht 64.0 in | Wt 165.8 lb

## 2020-07-02 DIAGNOSIS — R059 Cough, unspecified: Secondary | ICD-10-CM | POA: Diagnosis not present

## 2020-07-02 DIAGNOSIS — R053 Chronic cough: Secondary | ICD-10-CM

## 2020-07-02 DIAGNOSIS — I152 Hypertension secondary to endocrine disorders: Secondary | ICD-10-CM | POA: Diagnosis not present

## 2020-07-02 DIAGNOSIS — E1159 Type 2 diabetes mellitus with other circulatory complications: Secondary | ICD-10-CM

## 2020-07-02 DIAGNOSIS — E1169 Type 2 diabetes mellitus with other specified complication: Secondary | ICD-10-CM | POA: Diagnosis not present

## 2020-07-02 DIAGNOSIS — E785 Hyperlipidemia, unspecified: Secondary | ICD-10-CM

## 2020-07-02 MED ORDER — AMLODIPINE BESYLATE 2.5 MG PO TABS: 2.5000 mg | ORAL_TABLET | Freq: Every day | ORAL | 3 refills | Status: DC

## 2020-07-02 NOTE — Progress Notes (Signed)
Phone [phone] In person visit   Subjective:   Dana Mcmahon is a 65 y.o. year old very pleasant female patient who presents for/with See problem oriented charting Chief Complaint  Patient presents with  . Hypertension  . Cough    Intermittent cough, Post covid, she request xray.    This visit occurred during the SARS-CoV-2 public health emergency.  Safety protocols were in place, including screening questions prior to the visit, additional usage of staff PPE, and extensive cleaning of exam room while observing appropriate contact time as indicated for disinfecting solutions.   Past Medical History-  Patient Active Problem List   Diagnosis Date Noted  . Diabetes mellitus type II, controlled ([place]) 05/10/2013    Priority: High  . Headache, unspecified headache type 06/03/2017    Priority: Medium  . History of adenomatous polyp of colon 07/25/2016    Priority: Medium  . Hyperlipidemia associated with type 2 diabetes mellitus ([place])     Priority: Medium  . Hypertension associated with diabetes ([place]) 05/10/2013    Priority: Medium  . Palpitations 05/10/2013    Priority: Medium  . Osteoarthritis, hand 12/16/2014    Priority: Low  . Allergic rhinitis 07/24/2014    Priority: Low  . Obesity, Class I, BMI 30-34.9     Priority: Low  . Dizzy spells 08/08/2013    Priority: Low  . COVID-19 vaccine series declined 03/16/2020  . Upper airway cough syndrome 01/22/2016    Medications- reviewed and updated Current Outpatient Medications  Medication Sig Dispense Refill  . amLODipine (NORVASC) 2.5 MG tablet Take 1 tablet (2.5 mg total) by mouth daily. 90 tablet 3  . Ascorbic Acid (VITAMIN C PO) Take 1 tablet by mouth daily.    Marland Kitchen BIOTIN PO Take 1 tablet by mouth daily.    . Cholecalciferol (VITAMIN D3 PO) Take 1 capsule by mouth daily.    . diphenhydrAMINE HCl, Sleep, 50 MG CAPS Take 50 mg by mouth at bedtime.     Marland Kitchen ELDERBERRY PO Take by mouth.    Marland Kitchen glucose blood (ACCU-CHEK  GUIDE) test strip Check blood sugar three times daily (Patient taking differently: Check blood sugar three times daily. Patient states that she checks her blood sugar once in a while) 100 each 11  . Lancets (ACCU-CHEK MULTICLIX) lancets Check blood sugar three times daily 100 each 11  . losartan (COZAAR) 100 MG tablet Take 1 tablet by mouth daily.    . metFORMIN (GLUCOPHAGE-XR) 500 MG 24 hr tablet TAKE 1 TABLET(500 MG) BY MOUTH DAILY WITH BREAKFAST 90 tablet 3  . metoprolol succinate (TOPROL-XL) 25 MG 24 hr tablet Take 0.5 tablets (12.5 mg total) by mouth daily. 45 tablet 3  . Multiple Vitamin (MULTIVITAMIN WITH MINERALS) TABS tablet Take 1 tablet by mouth daily.    . rosuvastatin (CRESTOR) 5 MG tablet TAKE 1 TABLET(5 MG) BY MOUTH 1 TIME A WEEK 13 tablet 3  . vitamin E 1000 UNIT capsule Take 1,000 Units by mouth daily.     No current facility-administered medications for this visit.     Objective:  BP (!) 172/88   Pulse 75   Temp 98.1 F (36.7 C) (Temporal)   Ht 5\' 4"  (1.626 m)   Wt 165 lb 12.8 oz (75.2 kg)   LMP  (LMP Unknown)   SpO2 94%   BMI 28.46 kg/m  Gen: NAD, resting comfortably HENT: Bilateral nasal turbinates mildly swollen, some signs of drainage in oropharynx CV: RRR no murmurs rubs or gallops  Lungs: CTAB no crackles, wheeze, rhonchi Ext: no edema     Assessment and Plan   #Presistent cough S: Patient developed COVID-19 2017 2022 with positive test within a week for COVID-19.  At that point she was outside the window for outpatient COVID treatments.  She presents see me on March 26, 2020  Once we determined her A1c was at least in a reasonable place we sent in a course of prednisone but symptoms did not improve.  Patient later complained of sinus pressure and requested trial of antibiotics for bacterial sinusitis--treated with doxycycline. She has an on and off cough and mild ear pressure-she was referred to Pulmonary but she is waiting to be called. No OTC  medications taken for relief A/P: Patient developed persistent cough after COVID-19 back in January-some improvement with prednisone and later Augmentin for potential sinusitis.  Overall is improving but still intermittent.  She does admit could have some allergies and agrees to Try Zyrtec before bed  -We would also like to update chest x-ray today given duration of cough - with improvement she now declines referral to pulmonology which was previously placed for chronic cough  # Diabetes S: compliant with metformin 500mg  once daily-recent changed to extended release. Was having diarrhea on higher doses. Possibly could add [place] if needed if gets high and cannot control with diet/exercise. She is walking 30 minutes daily    Lab Results  Component Value Date   HGBA1C 7.5 (H) 05/11/2020   HGBA1C 7.2 (H) 01/14/2020   HGBA1C 7.6 (H) 04/08/2019  A/P: Diabetes with mild poor control.  Recommended keeping up her efforts for regular walking-I think with that and her being persistent with metformin extended release will improve A1c- recommended follow-up at June visit for repeat A1c and blood pressure recheck  Hyperlipidemia  S: Compliant with rosuvastatin 5 mg once a week.   Lab Results  Component Value Date   CHOL 230 (H) 04/08/2019   HDL 45.40 04/08/2019   LDLDIRECT 110.0 04/08/2019   TRIG 363.0 (H) 04/08/2019   CHOLHDL 5 04/08/2019  A/P: Mild poor control last check- at next visit we will recheck A1c likely update lipid panel as well  Hypertension S:compliant with losartan 100 mg once a day as well as metoprolol 12.5mg  extended release. -She had been on amlodipine in the past but did cause slight edema -Stopped HCTZ due to potentially causing gout.  Stopped ACE inhibitor in the past due to cough.  At Home BP #: Mainly 150-160/ 60s during at home checks, she relates this to increased stress  Exercise & Diet: She is walking for 30 minutes each day and eat fresh beats to improve her blood  pressure readings.  BP Readings from Last 3 Encounters:  07/02/20 (!) 172/88  06/05/20 (!) 144/88  03/26/20 132/88   A/P: Poor control today.  Continue losartan and metoprolol. Start amlodipine 2.5 before bed-monitor for edema -Patient also feels like stress from work could be contributing to elevated blood pressures-states work is very stressful and had to take a few days off-she may reach out if has persistent issues with blood pressure with medication change  Headache, unspecified headache type S: still getting some stress headaches mainly on the left side. advil or tylenol helps.  Had negative temporal artery biopsy a few years ago. It is not as persistent. No vision loss reported A/P: Will continue to monitor-overall improved   Recommended follow up: Scheduled June 22-keep this visit Future Appointments  Date Time Provider [place]  07/02/2020  4:00 PM [facility]  07/17/2020  1:30 PM [facility]  08/12/2020  8:20 AM Dvid Pendry, Brayton Mars, MD [facility]    Lab/Order associations:   ICD-10-CM   1. Hypertension associated with diabetes ([place])  E11.59    I15.2   2. Chronic cough  R05.3 DG Chest 2 View    Meds ordered this encounter  Medications  . amLODipine (NORVASC) 2.5 MG tablet    Sig: Take 1 tablet (2.5 mg total) by mouth daily.    Dispense:  90 tablet    Refill:  3    I,Alexis Bryant,acting as a scribe for Garret Reddish, MD.,have documented all relevant documentation on the behalf of Garret Reddish, MD,as directed by  Garret Reddish, MD while in the presence of Garret Reddish, MD.   I, Garret Reddish, MD, have reviewed all documentation for this visit. The documentation on 07/02/20 for the exam, diagnosis, procedures, and orders are all accurate and complete.    Return precautions advised.  Garret Reddish, MD

## 2020-07-17 ENCOUNTER — Other Ambulatory Visit: Payer: Federal, State, Local not specified - PPO

## 2020-07-24 ENCOUNTER — Ambulatory Visit: Payer: Federal, State, Local not specified - PPO | Admitting: Family Medicine

## 2020-07-31 ENCOUNTER — Other Ambulatory Visit: Payer: Federal, State, Local not specified - PPO

## 2020-08-12 ENCOUNTER — Ambulatory Visit (INDEPENDENT_AMBULATORY_CARE_PROVIDER_SITE_OTHER): Payer: Federal, State, Local not specified - PPO | Admitting: Family Medicine

## 2020-08-12 NOTE — Progress Notes (Signed)
No-show for physical

## 2020-08-12 NOTE — Patient Instructions (Addendum)
Health Maintenance Due  Topic Date Due   Zoster Vaccines- Shingrix (1 of 2)  If on medicare- Please check with your pharmacy to see if they have the shingrix vaccine. If they do- please get this immunization and update [place] by phone call or mychart with dates you receive the vaccine  If on private insurance and she agrees- Shingrix #1 today. Repeat injection in 2-5 months. Schedule a nurse visit for the 2nd injection before you leave today (at the check out desk)  Never done   DEXA SCAN   Schedule your bone density test at check out desk. You may also call directly to X-ray at [phone] to schedule an appointment that is convenient for you.  - located [address] across the street from [place] - in the basement - you do need an appointment for the bone density tests.   Never done   Prevnar 20 recommended today 01/11/2020   OPHTHALMOLOGY EXAM If you have had your eye exam within the last year, please sign release of information at check out desk. If you have not had an eye exam within a year, please get one at this time as this is important for your diabetes care  04/24/2020   MAMMOGRAM - recommend schedulign yearly mammogram 06/04/2020    Recommended follow up: No follow-ups on file.

## 2020-08-19 ENCOUNTER — Encounter: Payer: Self-pay | Admitting: Family Medicine

## 2020-08-19 ENCOUNTER — Other Ambulatory Visit: Payer: Federal, State, Local not specified - PPO

## 2020-09-01 DIAGNOSIS — H65191 Other acute nonsuppurative otitis media, right ear: Secondary | ICD-10-CM | POA: Diagnosis not present

## 2020-09-01 DIAGNOSIS — H6981 Other specified disorders of Eustachian tube, right ear: Secondary | ICD-10-CM | POA: Diagnosis not present

## 2020-09-22 NOTE — Progress Notes (Signed)
Phone [phone] In person visit   Subjective:   Dana Mcmahon is a 65 y.o. year old very pleasant female patient who presents for/with See problem oriented charting Chief Complaint  Patient presents with   Hypertension    This visit occurred during the SARS-CoV-2 public health emergency.  Safety protocols were in place, including screening questions prior to the visit, additional usage of staff PPE, and extensive cleaning of exam room while observing appropriate contact time as indicated for disinfecting solutions.   Past Medical History-  Patient Active Problem List   Diagnosis Date Noted   Diabetes mellitus type II, controlled ([place]) 05/10/2013    Priority: High   Headache, unspecified headache type 06/03/2017    Priority: Medium   History of adenomatous polyp of colon 07/25/2016    Priority: Medium   Hyperlipidemia associated with type 2 diabetes mellitus ([place])     Priority: Medium   Hypertension associated with diabetes ([place]) 05/10/2013    Priority: Medium   Palpitations 05/10/2013    Priority: Medium   Osteoarthritis, hand 12/16/2014    Priority: Low   Allergic rhinitis 07/24/2014    Priority: Low   Obesity, Class I, BMI 30-34.9     Priority: Low   Dizzy spells 08/08/2013    Priority: Low   COVID-19 vaccine series declined 03/16/2020   Upper airway cough syndrome 01/22/2016    Medications- reviewed and updated Current Outpatient Medications  Medication Sig Dispense Refill   amLODipine (NORVASC) 2.5 MG tablet Take 1 tablet (2.5 mg total) by mouth daily. 90 tablet 3   Ascorbic Acid (VITAMIN C PO) Take 1 tablet by mouth daily.     BIOTIN PO Take 1 tablet by mouth daily.     Cholecalciferol (VITAMIN D3 PO) Take 1 capsule by mouth daily.     diphenhydrAMINE HCl, Sleep, 50 MG CAPS Take 50 mg by mouth at bedtime.      ELDERBERRY PO Take by mouth.     fluticasone (FLONASE) 50 MCG/ACT nasal spray Place 1 spray into both nostrils daily.     glucose blood  (ACCU-CHEK GUIDE) test strip Check blood sugar three times daily (Patient taking differently: Check blood sugar three times daily. Patient states that she checks her blood sugar once in a while) 100 each 11   Lancets (ACCU-CHEK MULTICLIX) lancets Check blood sugar three times daily 100 each 11   losartan (COZAAR) 100 MG tablet Take 1 tablet by mouth daily.     metFORMIN (GLUCOPHAGE-XR) 500 MG 24 hr tablet TAKE 1 TABLET(500 MG) BY MOUTH DAILY WITH BREAKFAST 90 tablet 3   metoprolol succinate (TOPROL-XL) 25 MG 24 hr tablet Take 0.5 tablets (12.5 mg total) by mouth daily. 45 tablet 3   Multiple Vitamin (MULTIVITAMIN WITH MINERALS) TABS tablet Take 1 tablet by mouth daily.     rosuvastatin (CRESTOR) 5 MG tablet TAKE 1 TABLET(5 MG) BY MOUTH 1 TIME A WEEK 13 tablet 3   vitamin E 1000 UNIT capsule Take 1,000 Units by mouth daily.     No current facility-administered medications for this visit.     Objective:  BP (!) 158/82   Pulse 70   Temp 98.3 F (36.8 C) (Temporal)   Ht '5\' 4"'$  (1.626 m)   Wt 163 lb 12.8 oz (74.3 kg)   LMP  (LMP Unknown)   SpO2 98%   BMI 28.12 kg/m  Gen: NAD, resting comfortably Left TM normal, R TM appears slightly retracted and cloudy- not erythematous- concern OME, pharynx mild  cobblestoning- some drainage noted. Has seen dentist and was told not dental pain.  CV: RRR no murmurs rubs or gallops Lungs: CTAB no crackles, wheeze, rhonchi Ext: no edema Skin: warm, dry    Assessment and Plan  # Diabetes S: compliant with metformin '500mg'$  once daily XR. Was having diarrhea on higher doses of IR. Possibly could add [place] if needed if gets high and cannot control with diet/exercise.  Lab Results  Component Value Date   HGBA1C 7.5 (H) 05/11/2020   HGBA1C 7.2 (H) 01/14/2020   HGBA1C 7.6 (H) 04/08/2019  A/P:hopefully improved- update a1c- if still 7.5 or above may need to add [place] as has not tolerated higher metformin doses in the past.  She prefers to work on  exercise/diet first though  #Hyperlipidemia  S: Compliant with  rosuvastatin 5 mg once a week. Lab Results  Component Value Date   CHOL 230 (H) 04/08/2019   HDL 45.40 04/08/2019   LDLDIRECT 110.0 04/08/2019   TRIG 363.0 (H) 04/08/2019   CHOLHDL 5 04/08/2019  A/P: hopefully controlled- update lipids- if LDL above 100 could consider 10 mg once a week   #Hypertension S:compliant with  amlodipine 2.5 mg once a day (started last visit), losartan '100mg'$ ,  as well as metoprolol 12.'5mg'$  extended release.   Stopped HCTZ due to potentially causing gout. Stopped ACE inhibitor in the past due to cough.  Home reading #s: has not checked recently - prior to ear issues was mid 140s/70s BP Readings from Last 3 Encounters:  09/23/20 (!) 158/82  07/02/20 (!) 154/78  06/05/20 (!) 144/88  A/P: Blood pressure is poorly controlled.  We discussed potentially increasing amlodipine, changing to valsartan or increasing metoprolol-she would like to hold off for now and focus on getting her ear better and increasing her exercise as well as improving diet-I asked her to update me in the next month with how numbers are looking at home and we can make an adjustment at that time-she can update me through [place] or phone call  #Headache, unspecified headache type S: still getting some stress headaches mainly on the left side. advil or tylenol helps. Had negative temporal artery biopsy a few years ago. No vision loss reported A/P: still getting occasional headaches once every 2 weeks- worse with stress- overall better recently- though has had some right sided ear pain as below   # Right ear pain S:was seen at urgent care on 09/01/20 for right ear pain and diagnosed with nonsuppurative otitis media of right ear- placed on medrol dose pack and clarithromycin- also thought to have eustachian tube dysfunction and started on flonase- now back to as needed (she does not want to use consistently due to concern of thinning nasal  passages/nose bleeds)   Ongoing post nasal drip since January- only using benadryl- not off antihistamine. Pain level when went to urgent care was 10- now back up to at least 4/10.   Has an appointment with ENT on 10/07/20 with Dr. Deeann Saint office- will see PA first. Perhaps mild lingering cough dating all the way back to covid  Mild hearing loss and feeling of fullness A/P: I am concerned for otitis media with effusion.  From avs " Right ear has appearance of possible fluid behind it-I think this could be coming from allergies-I want you to take Allegra or Claritin or Zyrtec or Xyzal nightly before bed along with Flonase in the morning for a full 2 weeks until you see ear nose and throat-let me know what  Dr. Benjamine Mola thinks at that time"  Recommended follow up: Return in about 4 months (around 01/23/2021) for follow up- or sooner if needed if blood pressure not improving.  Lab/Order associations:   ICD-10-CM   1. Hypertension associated with diabetes ([place])  E11.59 CBC with Differential/Platelet   I15.2 Comprehensive metabolic panel    Lipid panel    2. Hyperlipidemia associated with type 2 diabetes mellitus ([facility])  E11.69 CBC with Differential/Platelet   E78.5 Comprehensive metabolic panel    Lipid panel    3. Controlled type 2 diabetes mellitus without complication, without long-term current use of insulin ([facility])  E11.9 CBC with Differential/Platelet    Comprehensive metabolic panel    Lipid panel    Hemoglobin A1c    4. Headache, unspecified headache type  R51.9     5. Encounter for screening mammogram for malignant neoplasm of breast  Z12.31 MM 3D SCREEN BREAST BILATERAL    6. Postmenopausal  Z78.0 DG Bone Density     Return precautions advised.  Garret Reddish, MD

## 2020-09-23 ENCOUNTER — Ambulatory Visit (INDEPENDENT_AMBULATORY_CARE_PROVIDER_SITE_OTHER): Payer: Federal, State, Local not specified - PPO | Admitting: Family Medicine

## 2020-09-23 ENCOUNTER — Other Ambulatory Visit: Payer: Self-pay

## 2020-09-23 ENCOUNTER — Encounter: Payer: Self-pay | Admitting: Family Medicine

## 2020-09-23 VITALS — BP 158/82 | HR 70 | Temp 98.3°F | Ht 64.0 in | Wt 163.8 lb

## 2020-09-23 DIAGNOSIS — E785 Hyperlipidemia, unspecified: Secondary | ICD-10-CM | POA: Diagnosis not present

## 2020-09-23 DIAGNOSIS — Z1231 Encounter for screening mammogram for malignant neoplasm of breast: Secondary | ICD-10-CM | POA: Diagnosis not present

## 2020-09-23 DIAGNOSIS — I152 Hypertension secondary to endocrine disorders: Secondary | ICD-10-CM | POA: Diagnosis not present

## 2020-09-23 DIAGNOSIS — E1169 Type 2 diabetes mellitus with other specified complication: Secondary | ICD-10-CM | POA: Diagnosis not present

## 2020-09-23 DIAGNOSIS — R519 Headache, unspecified: Secondary | ICD-10-CM

## 2020-09-23 DIAGNOSIS — E119 Type 2 diabetes mellitus without complications: Secondary | ICD-10-CM | POA: Diagnosis not present

## 2020-09-23 DIAGNOSIS — E1159 Type 2 diabetes mellitus with other circulatory complications: Secondary | ICD-10-CM

## 2020-09-23 DIAGNOSIS — Z780 Asymptomatic menopausal state: Secondary | ICD-10-CM

## 2020-09-23 LAB — LIPID PANEL
Cholesterol: 239 mg/dL — ABNORMAL HIGH (ref 0–200)
HDL: 45.6 mg/dL (ref >39.00)
Total CHOL/HDL Ratio: 5
Triglycerides: 548 mg/dL — ABNORMAL HIGH (ref 0.0–149.0)

## 2020-09-23 LAB — CBC WITH DIFFERENTIAL/PLATELET
Basophils Absolute: 0.1 10*3/uL (ref 0.0–0.1)
Basophils Relative: 1.1 % (ref 0.0–3.0)
Eosinophils Absolute: 0.2 10*3/uL (ref 0.0–0.7)
Eosinophils Relative: 4.7 % (ref 0.0–5.0)
HCT: 39.2 % (ref 36.0–46.0)
Hemoglobin: 13.3 g/dL (ref 12.0–15.0)
Lymphocytes Relative: 38.3 % (ref 12.0–46.0)
Lymphs Abs: 1.7 10*3/uL (ref 0.7–4.0)
MCHC: 33.8 g/dL (ref 30.0–36.0)
MCV: 85.1 fl (ref 78.0–100.0)
Monocytes Absolute: 0.3 10*3/uL (ref 0.1–1.0)
Monocytes Relative: 7.4 % (ref 3.0–12.0)
Neutro Abs: 2.2 10*3/uL (ref 1.4–7.7)
Neutrophils Relative %: 48.5 % (ref 43.0–77.0)
Platelets: 249 10*3/uL (ref 150.0–400.0)
RBC: 4.61 Mil/uL (ref 3.87–5.11)
RDW: 14.6 % (ref 11.5–15.5)
WBC: 4.6 10*3/uL (ref 4.0–10.5)

## 2020-09-23 LAB — LDL CHOLESTEROL, DIRECT: Direct LDL: 88 mg/dL

## 2020-09-23 LAB — COMPREHENSIVE METABOLIC PANEL
ALT: 21 U/L (ref 0–35)
AST: 18 U/L (ref 0–37)
Albumin: 4.2 g/dL (ref 3.5–5.2)
Alkaline Phosphatase: 73 U/L (ref 39–117)
BUN: 12 mg/dL (ref 6–23)
CO2: 27 mEq/L (ref 19–32)
Calcium: 9.5 mg/dL (ref 8.4–10.5)
Chloride: 101 mEq/L (ref 96–112)
Creatinine, Ser: 0.82 mg/dL (ref 0.40–1.20)
GFR: 74.92 mL/min (ref >60.00)
Glucose, Bld: 126 mg/dL — ABNORMAL HIGH (ref 70–99)
Potassium: 3.5 mEq/L (ref 3.5–5.1)
Sodium: 139 mEq/L (ref 135–145)
Total Bilirubin: 0.5 mg/dL (ref 0.2–1.2)
Total Protein: 7.7 g/dL (ref 6.0–8.3)

## 2020-09-23 LAB — HEMOGLOBIN A1C: Hgb A1c MFr Bld: 8.1 % — ABNORMAL HIGH (ref 4.6–6.5)

## 2020-09-23 NOTE — Patient Instructions (Addendum)
Health Maintenance Due  Topic Date Due   Zoster Vaccines- Shingrix (1 of 2)- wants to hold for now Never done   DEXA SCAN -   Schedule your bone density test at check out desk. You may also call directly to X-ray at [phone] to schedule an appointment that is convenient for you.  - located [address] across the street from [facility] - in the basement - you do need an appointment for the bone density tests.   Never done   PNA vac Low Risk Adult - consider prevnar 20 next visit  01/11/2020   OPHTHALMOLOGY EXAM - Sign release of information at the check out desk for last one  04/24/2020   MAMMOGRAM - please call to schedule  [place] Schedule an appointment by calling [phone].  06/04/2020   INFLUENZA VACCINE - recommend fall flu shot- let us know when you get this 09/21/2020   Blood pressure is poorly controlled.  We discussed potentially increasing amlodipine, changing to valsartan or increasing metoprolol-she would like to hold off for now and focus on getting her ear better and increasing her exercise as well as improving diet-I asked her to update me in the next month with how numbers are looking at home and we can make an adjustment at that time-she can update me through [place] or phone call  Right ear has appearance of possible fluid behind it-I think this could be coming from allergies-I want you to take Allegra or Claritin or Zyrtec or Xyzal nightly before bed along with Flonase in the morning for a full 2 weeks until you see ear nose and throat-let me know what Dr. Benjamine Mola thinks at that time  Please stop by lab before you go If you have mychart- we will send your results within 3 business days of [place] receiving them.  If you do not have mychart- we will call you about results within 5 business days of [place] receiving them.  *please also note that you will see labs on mychart as soon as they post. I will later go in and write notes on them- will say  "notes from Dr. Yong Channel"   Recommended follow up: Return in about 4 months (around 01/23/2021) for follow up- or sooner if needed if blood pressure not improving.

## 2020-09-27 DIAGNOSIS — K047 Periapical abscess without sinus: Secondary | ICD-10-CM | POA: Diagnosis not present

## 2020-09-29 ENCOUNTER — Other Ambulatory Visit: Payer: Federal, State, Local not specified - PPO

## 2020-09-29 ENCOUNTER — Other Ambulatory Visit: Payer: Self-pay

## 2020-09-29 DIAGNOSIS — I10 Essential (primary) hypertension: Secondary | ICD-10-CM | POA: Diagnosis not present

## 2020-09-30 LAB — BASIC METABOLIC PANEL
BUN/Creatinine Ratio: 23 (ref 12–28)
BUN: 20 mg/dL (ref 8–27)
CO2: 20 mmol/L (ref 20–29)
Calcium: 9.6 mg/dL (ref 8.7–10.3)
Chloride: 102 mmol/L (ref 96–106)
Creatinine, Ser: 0.86 mg/dL (ref 0.57–1.00)
Glucose: 131 mg/dL — ABNORMAL HIGH (ref 65–99)
Potassium: 3.8 mmol/L (ref 3.5–5.2)
Sodium: 140 mmol/L (ref 134–144)
eGFR: 75 mL/min/{1.73_m2} (ref >59)

## 2020-11-24 ENCOUNTER — Inpatient Hospital Stay
Admission: RE | Admit: 2020-11-24 | Discharge: 2020-11-24 | Disposition: A | Discharge status: Home / Self Care | Payer: Federal, State, Local not specified - PPO | Source: Ambulatory Visit | Attending: Family Medicine | Admitting: Family Medicine

## 2020-11-26 ENCOUNTER — Other Ambulatory Visit: Payer: Self-pay | Admitting: Family Medicine

## 2020-12-25 ENCOUNTER — Ambulatory Visit (INDEPENDENT_AMBULATORY_CARE_PROVIDER_SITE_OTHER)
Admission: RE | Admit: 2020-12-25 | Discharge: 2020-12-25 | Disposition: A | Discharge status: Home / Self Care | Payer: Federal, State, Local not specified - PPO | Source: Ambulatory Visit | Attending: Family Medicine | Admitting: Family Medicine

## 2020-12-25 ENCOUNTER — Other Ambulatory Visit: Payer: Self-pay

## 2020-12-25 ENCOUNTER — Ambulatory Visit
Admission: RE | Admit: 2020-12-25 | Discharge: 2020-12-25 | Disposition: A | Discharge status: Home / Self Care | Payer: Federal, State, Local not specified - PPO | Source: Ambulatory Visit | Attending: Family Medicine | Admitting: Family Medicine

## 2020-12-25 DIAGNOSIS — Z1231 Encounter for screening mammogram for malignant neoplasm of breast: Secondary | ICD-10-CM | POA: Diagnosis not present

## 2020-12-25 DIAGNOSIS — Z780 Asymptomatic menopausal state: Secondary | ICD-10-CM | POA: Diagnosis not present

## 2020-12-29 DIAGNOSIS — Z780 Asymptomatic menopausal state: Secondary | ICD-10-CM | POA: Diagnosis not present

## 2021-01-20 NOTE — Progress Notes (Signed)
Phone [phone] In person visit   Subjective:   Dana Mcmahon is a 65 y.o. year old very pleasant female patient who presents for/with See problem oriented charting Chief Complaint  Patient presents with   Follow-up   Hypertension   Diabetes    This visit occurred during the SARS-CoV-2 public health emergency.  Safety protocols were in place, including screening questions prior to the visit, additional usage of staff PPE, and extensive cleaning of exam room while observing appropriate contact time as indicated for disinfecting solutions.   Past Medical History-  Patient Active Problem List   Diagnosis Date Noted   Diabetes mellitus type II, controlled ([place]) 05/10/2013    Priority: High   Headache, unspecified headache type 06/03/2017    Priority: Medium    History of adenomatous polyp of colon 07/25/2016    Priority: Medium    Hyperlipidemia associated with type 2 diabetes mellitus ([place])     Priority: Medium    Hypertension associated with diabetes ([place]) 05/10/2013    Priority: Medium    Palpitations 05/10/2013    Priority: Medium    Osteoarthritis, hand 12/16/2014    Priority: Low   Allergic rhinitis 07/24/2014    Priority: Low   Obesity, Class I, BMI 30-34.9     Priority: Low   Dizzy spells 08/08/2013    Priority: Low   COVID-19 vaccine series declined 03/16/2020   Upper airway cough syndrome 01/22/2016    Medications- reviewed and updated Current Outpatient Medications  Medication Sig Dispense Refill   amLODipine (NORVASC) 2.5 MG tablet Take 1 tablet (2.5 mg total) by mouth daily. 90 tablet 3   Ascorbic Acid (VITAMIN C PO) Take 1 tablet by mouth daily.     BIOTIN PO Take 1 tablet by mouth daily.     Cholecalciferol (VITAMIN D3 PO) Take 1 capsule by mouth daily.     diphenhydrAMINE HCl, Sleep, 50 MG CAPS Take 50 mg by mouth at bedtime.      ELDERBERRY PO Take by mouth.     fluticasone (FLONASE) 50 MCG/ACT nasal spray Place 1 spray into both nostrils  daily.     glucose blood (ACCU-CHEK GUIDE) test strip Check blood sugar three times daily (Patient taking differently: Check blood sugar three times daily. Patient states that she checks her blood sugar once in a while) 100 each 11   Lancets (ACCU-CHEK MULTICLIX) lancets Check blood sugar three times daily 100 each 11   losartan (COZAAR) 100 MG tablet Take 1 tablet by mouth daily.     metoprolol succinate (TOPROL-XL) 25 MG 24 hr tablet Take 0.5 tablets (12.5 mg total) by mouth daily. 45 tablet 3   Multiple Vitamin (MULTIVITAMIN WITH MINERALS) TABS tablet Take 1 tablet by mouth daily.     vitamin E 1000 UNIT capsule Take 1,000 Units by mouth daily.     metFORMIN (GLUCOPHAGE-XR) 500 MG 24 hr tablet Take 1 tablet (500 mg total) by mouth in the morning and at bedtime. 180 tablet 3   rosuvastatin (CRESTOR) 5 MG tablet Take one tablet twice a week 26 tablet 3   No current facility-administered medications for this visit.     Objective:  BP 120/78   Pulse 68   Temp 98.1 F (36.7 C)   Ht 5\' 4"  (1.626 m)   Wt 164 lb 9.6 oz (74.7 kg)   LMP  (LMP Unknown)   SpO2 98%   BMI 28.25 kg/m  Gen: NAD, appears stressed particularly talking about work CV:  RRR no murmurs rubs or gallops Lungs: CTAB no crackles, wheeze, rhonchi Ext: no edema Skin: warm, dry     Assessment and Plan   #social update/headache update- she states considering retirement- getting more left sided headaches- feels stress related. Job has been progressively challenging since becoming unionized.  - last visit stress headaches mainly on the left side. advil or tylenol was helpful in pastbut has had progression as above. Had negative temporal artery biopsy a few years ago. Some blurry vision at times if gets lightheaded- not usually with the headache -strongly recommended neurology visit again- she declines today- she realizes this is against medical advise and there could be something more serious going on like mass in brain. Has  seen optho- did not think issues were related to eyes -FMLA will be completed for 4x per month duration 1-2 days- she is having more frequent headaches -she misses days of work- ices head and rests and that generally does help- prior advilr or tylenol no longer as effective  # Diabetes S: compliant with metformin 500mg  once daily XR in past- we were going to retry twice daily - she did not get this message . Was having diarrhea on higher doses. Possibly could add [place] if needed if gets high  -stress could contribute Lab Results  Component Value Date   HGBA1C 8.1 (H) 09/23/2020   HGBA1C 7.5 (H) 05/11/2020   HGBA1C 7.2 (H) 01/14/2020   A/P: poor control last check and has not chnaged meds yet- update a1c and likely trial twice daily metformin if tolerable- if not or a1c still not at goal with this possibly add [place]  #Hyperlipidemia  S: Compliant with  rosuvastatin 5 mg once a week.  Lab Results  Component Value Date   CHOL 239 (H) 09/23/2020   HDL 45.60 09/23/2020   LDLDIRECT 88.0 09/23/2020   TRIG (H) 09/23/2020    548.0 Triglyceride is over 400; calculations on Lipids are invalid.   CHOLHDL 5 09/23/2020   A/P: For control last check-had recommended increasing to 3 times a week-triglycerides also very high but poorly controlled diabetes may have contributed.  Patient reports did not get message- she is willing to try twice a week    #Hypertension S:compliant with amlodipine 2.5 mg once a day as well as metoprolol 12.5mg  extended release.   Stopped HCTZ due to potentially caused gout. Stopped ACE inhibitor in the past due to cough. BP Readings from Last 3 Encounters:  01/27/21 120/78  09/23/20 (!) 158/82  07/02/20 (!) 154/78  A/P: good control today- continue current meds  Recommended follow up: No follow-ups on file.  Lab/Order associations:   ICD-10-CM   1. Hyperlipidemia associated with type 2 diabetes mellitus ([place])  E11.69    E78.5     2. Hypertension associated  with diabetes ([place])  E11.59    I15.2     3. Controlled type 2 diabetes mellitus without complication, without long-term current use of insulin ([facility])  E11.9 Comprehensive metabolic panel    Hemoglobin A1c    4. Headache, unspecified headache type  R51.9     5. Need for immunization against influenza  Z23 Flu Vaccine QUAD High Dose(Fluad)      Meds ordered this encounter  Medications   rosuvastatin (CRESTOR) 5 MG tablet    Sig: Take one tablet twice a week    Dispense:  26 tablet    Refill:  3    ZERO refills remain on this prescription. Your patient is requesting advance  approval of refills for this medication to PREVENT ANY MISSED DOSES   metFORMIN (GLUCOPHAGE-XR) 500 MG 24 hr tablet    Sig: Take 1 tablet (500 mg total) by mouth in the morning and at bedtime.    Dispense:  180 tablet    Refill:  3    **Patient requests 90 days supply**   I,Jada Bradford,acting as a scribe for Garret Reddish, MD.,have documented all relevant documentation on the behalf of Garret Reddish, MD,as directed by  Garret Reddish, MD while in the presence of Garret Reddish, MD.   I, Garret Reddish, MD, have reviewed all documentation for this visit. The documentation on 01/27/21 for the exam, diagnosis, procedures, and orders are all accurate and complete.   Return precautions advised.  Garret Reddish, MD

## 2021-01-27 ENCOUNTER — Other Ambulatory Visit: Payer: Self-pay

## 2021-01-27 ENCOUNTER — Ambulatory Visit: Payer: Federal, State, Local not specified - PPO | Admitting: Family Medicine

## 2021-01-27 ENCOUNTER — Encounter: Payer: Self-pay | Admitting: Family Medicine

## 2021-01-27 VITALS — BP 120/78 | HR 68 | Temp 98.1°F | Ht 64.0 in | Wt 164.6 lb

## 2021-01-27 DIAGNOSIS — E1169 Type 2 diabetes mellitus with other specified complication: Secondary | ICD-10-CM

## 2021-01-27 DIAGNOSIS — E119 Type 2 diabetes mellitus without complications: Secondary | ICD-10-CM | POA: Diagnosis not present

## 2021-01-27 DIAGNOSIS — E1159 Type 2 diabetes mellitus with other circulatory complications: Secondary | ICD-10-CM

## 2021-01-27 DIAGNOSIS — Z23 Encounter for immunization: Secondary | ICD-10-CM

## 2021-01-27 DIAGNOSIS — I10 Essential (primary) hypertension: Secondary | ICD-10-CM

## 2021-01-27 DIAGNOSIS — R519 Headache, unspecified: Secondary | ICD-10-CM

## 2021-01-27 DIAGNOSIS — E785 Hyperlipidemia, unspecified: Secondary | ICD-10-CM

## 2021-01-27 LAB — COMPREHENSIVE METABOLIC PANEL
ALT: 25 U/L (ref 0–35)
AST: 19 U/L (ref 0–37)
Albumin: 4.4 g/dL (ref 3.5–5.2)
Alkaline Phosphatase: 71 U/L (ref 39–117)
BUN: 17 mg/dL (ref 6–23)
CO2: 31 mEq/L (ref 19–32)
Calcium: 10.1 mg/dL (ref 8.4–10.5)
Chloride: 102 mEq/L (ref 96–112)
Creatinine, Ser: 0.95 mg/dL (ref 0.40–1.20)
GFR: 62.64 mL/min (ref >60.00)
Glucose, Bld: 125 mg/dL — ABNORMAL HIGH (ref 70–99)
Potassium: 4 mEq/L (ref 3.5–5.1)
Sodium: 141 mEq/L (ref 135–145)
Total Bilirubin: 0.5 mg/dL (ref 0.2–1.2)
Total Protein: 7.8 g/dL (ref 6.0–8.3)

## 2021-01-27 LAB — HEMOGLOBIN A1C: Hgb A1c MFr Bld: 7.8 % — ABNORMAL HIGH (ref 4.6–6.5)

## 2021-01-27 MED ORDER — METFORMIN HCL ER 500 MG PO TB24: 500.0000 mg | ORAL_TABLET | Freq: Two times a day (BID) | ORAL | 3 refills | Status: DC

## 2021-01-27 MED ORDER — ROSUVASTATIN CALCIUM 5 MG PO TABS: ORAL_TABLET | ORAL | 3 refills | Status: DC

## 2021-01-27 NOTE — Patient Instructions (Addendum)
Health Maintenance Due  Topic Date Due   Zoster Vaccines- Shingrix (1 of 2)- Please check with your pharmacy to see if they have the shingrix vaccine. If they do- please get this immunization and update [place] by phone call or mychart with dates you receive the vaccine  Never done   OPHTHALMOLOGY EXAM - Sign release of information at the check out desk for recent eye exam.  04/24/2020   Please take Rosuvastatin(Crestor) 5 mg twice a week now  Increase metformin extended release to twice daily- if a1c does not improve in 3 months then we need to consider adding additional medicine- hoping you do not have side effects with this change  Please stop by lab before you go If you have mychart- we will send your results within 3 business days of [place] receiving them.  If you do not have mychart- we will call you about results within 5 business days of [place] receiving them.  *please also note that you will see labs on mychart as soon as they post. I will later go in and write notes on them- will say "notes from Dr. Yong Channel"  Recommended follow up: 3 months and a day (around 04/28/2021)  for follow-up or sooner if needed.   Happy Holidays!

## 2021-02-22 ENCOUNTER — Other Ambulatory Visit: Payer: Self-pay | Admitting: Family Medicine

## 2021-04-26 NOTE — Progress Notes (Incomplete)
? ?Phone [phone] ?In person visit ?  ?Subjective:  ? ?Dana Mcmahon is a 66 y.o. year old very pleasant female patient who presents for/with See problem oriented charting ?No chief complaint on file. ? ? ?This visit occurred during the SARS-CoV-2 public health emergency.  Safety protocols were in place, including screening questions prior to the visit, additional usage of staff PPE, and extensive cleaning of exam room while observing appropriate contact time as indicated for disinfecting solutions.  ? ?Past Medical History-  ?Patient Active Problem List  ? Diagnosis Date Noted  ? COVID-19 vaccine series declined 03/16/2020  ? Headache, unspecified headache type 06/03/2017  ? History of adenomatous polyp of colon 07/25/2016  ? Upper airway cough syndrome 01/22/2016  ? Osteoarthritis, hand 12/16/2014  ? Allergic rhinitis 07/24/2014  ? Hyperlipidemia associated with type 2 diabetes mellitus ([place])   ? Obesity, Class I, BMI 30-34.9   ? Dizzy spells 08/08/2013  ? Diabetes mellitus type II, controlled ([place]) 05/10/2013  ? Hypertension associated with diabetes ([place]) 05/10/2013  ? Palpitations 05/10/2013  ? ? ?Medications- reviewed and updated ?Current Outpatient Medications  ?Medication Sig Dispense Refill  ? amLODipine (NORVASC) 2.5 MG tablet TAKE 1 TABLET(2.5 MG) BY MOUTH DAILY 90 tablet 3  ? Ascorbic Acid (VITAMIN C PO) Take 1 tablet by mouth daily.    ? BIOTIN PO Take 1 tablet by mouth daily.    ? Cholecalciferol (VITAMIN D3 PO) Take 1 capsule by mouth daily.    ? diphenhydrAMINE HCl, Sleep, 50 MG CAPS Take 50 mg by mouth at bedtime.     ? ELDERBERRY PO Take by mouth.    ? fluticasone (FLONASE) 50 MCG/ACT nasal spray Place 1 spray into both nostrils daily.    ? glucose blood (ACCU-CHEK GUIDE) test strip Check blood sugar three times daily (Patient taking differently: Check blood sugar three times daily. Patient states that she checks her blood sugar once in a while) 100 each 11  ? Lancets (ACCU-CHEK  MULTICLIX) lancets Check blood sugar three times daily 100 each 11  ? losartan (COZAAR) 100 MG tablet Take 1 tablet by mouth daily.    ? metFORMIN (GLUCOPHAGE-XR) 500 MG 24 hr tablet Take 1 tablet (500 mg total) by mouth in the morning and at bedtime. 180 tablet 3  ? metoprolol succinate (TOPROL-XL) 25 MG 24 hr tablet Take 0.5 tablets (12.5 mg total) by mouth daily. 45 tablet 3  ? Multiple Vitamin (MULTIVITAMIN WITH MINERALS) TABS tablet Take 1 tablet by mouth daily.    ? rosuvastatin (CRESTOR) 5 MG tablet TAKE 1 TABLET(5 MG) BY MOUTH 1 TIME A WEEK 13 tablet 3  ? vitamin E 1000 UNIT capsule Take 1,000 Units by mouth daily.    ? ?No current facility-administered medications for this visit.  ? ?  ?Objective:  ?LMP  (LMP Unknown)  ?Gen: NAD, resting comfortably ?CV: RRR no murmurs rubs or gallops ?Lungs: CTAB no crackles, wheeze, rhonchi ?Abdomen: soft/nontender/nondistended/normal bowel sounds. No rebound or guarding.  ?Ext: no edema ?Skin: warm, dry ?Neuro: grossly normal, moves all extremities ? ?*** ?  ? ?Assessment and Plan  ? ?***does not want to do covid 19 vaccine despite counseling ? ?# Diabetes ?S: compliant with metformin '500mg'$  once daily. Was having diarrhea on higher doses. Possibly could add [place] if needed if gets high and cannot control with diet/exercise.  ?CBGs- *** ?Exercise and diet- *** ?Lab Results  ?Component Value Date  ? HGBA1C 7.8 (H) 01/27/2021  ? HGBA1C 8.1 (H) 09/23/2020  ?  HGBA1C 7.5 (H) 05/11/2020  ? ? A/P: *** ? ?Hyperlipidemia  ?S: Compliant with  rosuvastatin 5 mg once a week.  ?Lab Results  ?Component Value Date  ? CHOL 239 (H) 09/23/2020  ? HDL 45.60 09/23/2020  ? LDLDIRECT 88.0 09/23/2020  ? TRIG (H) 09/23/2020  ?  548.0 Triglyceride is over 400; calculations on Lipids are invalid.  ? CHOLHDL 5 09/23/2020  ? A/P: *** ? ?#Hypertension ?S:compliant with amlodipine 2.5 mg once a day as well as metoprolol 25 mg extended release.  ? ?Stopped HCTZ due to potentially causing gout.  Stopped ACE inhibitor in the past due to cough. ?Home readings #s: *** ?BP Readings from Last 3 Encounters:  ?01/27/21 120/78  ?09/23/20 (!) 158/82  ?07/02/20 (!) 154/78  ?A/P: *** ? ?#Headache, unspecified headache type ?S: still was getting some stress headaches mainly on the left side. advil or tylenol were helpful. Had negative temporal artery biopsy a few years ago. No vision loss reported ?A/P: ***  ? ?Health Maintenance Due  ?Topic Date Due  ? Zoster Vaccines- Shingrix (1 of 2) Never done  ? Pneumonia Vaccine 88+ Years old (2 - PCV) 09/29/2016  ? OPHTHALMOLOGY EXAM  04/24/2020  ? FOOT EXAM  01/13/2021  ? ?Recommended follow up: No follow-ups on file. ?Future Appointments  ?Date Time Provider [place]  ?05/13/2021 10:00 AM Hunter, Brayton Mars, MD [facility]  ? ? ?Lab/Order associations: ?No diagnosis found. ? ?No orders of the defined types were placed in this encounter. ? ? ?I,Jada Bradford,acting as a scribe for Garret Reddish, MD.,have documented all relevant documentation on the behalf of Garret Reddish, MD,as directed by  Garret Reddish, MD while in the presence of Garret Reddish, MD. ? ?*** ?Return precautions advised.  ?[name] ? ? ?

## 2021-05-13 ENCOUNTER — Ambulatory Visit: Payer: Federal, State, Local not specified - PPO | Admitting: Family Medicine

## 2021-05-13 DIAGNOSIS — R519 Headache, unspecified: Secondary | ICD-10-CM

## 2021-05-13 DIAGNOSIS — E785 Hyperlipidemia, unspecified: Secondary | ICD-10-CM

## 2021-05-13 DIAGNOSIS — E119 Type 2 diabetes mellitus without complications: Secondary | ICD-10-CM

## 2021-05-13 DIAGNOSIS — I10 Essential (primary) hypertension: Secondary | ICD-10-CM

## 2021-05-13 DIAGNOSIS — E1169 Type 2 diabetes mellitus with other specified complication: Secondary | ICD-10-CM

## 2021-05-17 NOTE — Progress Notes (Signed)
? ?Phone [phone] ?In person visit ?  ?Subjective:  ? ?Dana Mcmahon is a 66 y.o. year old very pleasant female patient who presents for/with See problem oriented charting ?Chief Complaint  ?Patient presents with  ? Follow-up  ? Hypertension  ? Diabetes  ? Hyperlipidemia  ? mole growing  ?  Pt states the mole on her side is growing.  ? ? ?This visit occurred during the SARS-CoV-2 public health emergency.  Safety protocols were in place, including screening questions prior to the visit, additional usage of staff PPE, and extensive cleaning of exam room while observing appropriate contact time as indicated for disinfecting solutions.  ? ?Past Medical History-  ?Patient Active Problem List  ? Diagnosis Date Noted  ? Diabetes mellitus type II, controlled ([place]) 05/10/2013  ?  Priority: High  ? Headache, unspecified headache type 06/03/2017  ?  Priority: Medium   ? History of adenomatous polyp of colon 07/25/2016  ?  Priority: Medium   ? Upper airway cough syndrome 01/22/2016  ?  Priority: Medium   ? Hyperlipidemia associated with type 2 diabetes mellitus ([place])   ?  Priority: Medium   ? Primary hypertension 05/10/2013  ?  Priority: Medium   ? Palpitations 05/10/2013  ?  Priority: Medium   ? COVID-19 vaccine series declined 03/16/2020  ?  Priority: Low  ? Osteoarthritis, hand 12/16/2014  ?  Priority: Low  ? Allergic rhinitis 07/24/2014  ?  Priority: Low  ? Obesity, Class I, BMI 30-34.9   ?  Priority: Low  ? Dizzy spells 08/08/2013  ?  Priority: Low  ? ? ?Medications- reviewed and updated ?Current Outpatient Medications  ?Medication Sig Dispense Refill  ? amLODipine (NORVASC) 2.5 MG tablet TAKE 1 TABLET(2.5 MG) BY MOUTH DAILY 30 tablet 5  ? Ascorbic Acid (VITAMIN C PO) Take 1 tablet by mouth daily.    ? BIOTIN PO Take 1 tablet by mouth daily.    ? Cholecalciferol (VITAMIN D3 PO) Take 1 capsule by mouth daily.    ? diphenhydrAMINE HCl, Sleep, 50 MG CAPS Take 50 mg by mouth at bedtime.     ? ELDERBERRY PO Take by  mouth.    ? fluticasone (FLONASE) 50 MCG/ACT nasal spray Place 1 spray into both nostrils daily.    ? glucose blood (ACCU-CHEK GUIDE) test strip Check blood sugar three times daily (Patient taking differently: Check blood sugar three times daily. Patient states that she checks her blood sugar once in a while) 100 each 11  ? Lancets (ACCU-CHEK MULTICLIX) lancets Check blood sugar three times daily 100 each 11  ? losartan (COZAAR) 100 MG tablet Take 1 tablet by mouth daily.    ? metFORMIN (GLUCOPHAGE-XR) 500 MG 24 hr tablet Take 1 tablet (500 mg total) by mouth in the morning and at bedtime. 180 tablet 3  ? metoprolol succinate (TOPROL-XL) 25 MG 24 hr tablet Take 0.5 tablets (12.5 mg total) by mouth daily. 45 tablet 3  ? Multiple Vitamin (MULTIVITAMIN WITH MINERALS) TABS tablet Take 1 tablet by mouth daily.    ? rosuvastatin (CRESTOR) 5 MG tablet TAKE 1 TABLET(5 MG) BY MOUTH 1 TIME A WEEK 13 tablet 3  ? vitamin E 1000 UNIT capsule Take 1,000 Units by mouth daily.    ? ?No current facility-administered medications for this visit.  ? ?  ?Objective:  ?BP 132/72   Pulse 62   Temp 97.7 ?F (36.5 ?C)   Ht '5\' 4"'$  (1.626 m)   Wt 168 lb  6.4 oz (76.4 kg)   LMP  (LMP Unknown)   SpO2 97%   BMI 28.91 kg/m?  ?Gen: NAD, resting comfortably ?CV: RRR no murmurs rubs or gallops ?Lungs: CTAB no crackles, wheeze, rhonchi ?Abdomen: soft/nontender/nondistended/normal bowel sounds. No rebound or guarding.  ?Ext: no edema ?Skin: warm, dry, darker 1 x 1 cm or larger raised area on right flank- possible SK but darker appearance concerning ?  ?Diabetic Foot Exam - Simple   ?Simple Foot Form ?Diabetic Foot exam was performed with the following findings: Yes 06/11/2021  8:54 AM  ?Visual Inspection ?No deformities, no ulcerations, no other skin breakdown bilaterally: Yes ?Sensation Testing ?Intact to touch and monofilament testing bilaterally: Yes ?Pulse Check ?Posterior Tibialis and Dorsalis pulse intact bilaterally: Yes ?Comments ?  ?   ?   ? ?Assessment and Plan  ? ?# Diabetes ?S: compliant with metformin '500mg'$  twice daily was plan to retrial up from daily at last visit ?-In the past Was having diarrhea on higher doses.  ?-Possibly could add [place] if needed if gets high and cannot control with diet/exercise.  ?CBGs- does not check ?Exercise and diet- walking and stretching in AMs- daily, weight up some ?Lab Results  ?Component Value Date  ? HGBA1C 7.8 (H) 01/27/2021  ? HGBA1C 8.1 (H) 09/23/2020  ? HGBA1C 7.5 (H) 05/11/2020  ? A/P: hopefully improved- update a1c today. Continue current meds for now ?- she would lean toward ozempic- reviewed side effects- some headache risk ?-also possible [place]- lower side effect profile- no CV risk reduction ? ?#Hyperlipidemia  ?S: Compliant with  rosuvastatin 5 mg once a week.  ?Lab Results  ?Component Value Date  ? CHOL 239 (H) 09/23/2020  ? HDL 45.60 09/23/2020  ? LDLDIRECT 88.0 09/23/2020  ? TRIG (H) 09/23/2020  ?  548.0 Triglyceride is over 400; calculations on Lipids are invalid.  ? CHOLHDL 5 09/23/2020  ? A/P: declines increase in rosuvastatin to 43x a week- wants to work on lifestyle and recheck at next visit ? ?#Hypertension ?S:S:compliant with amlodipine 2.5 mg once a day, losartan 100 mg, and metoprolol '25mg'$  extended release.   ? ?Stopped HCTZ due to potentially causing gout. Stopped ACE inhibitor in the past due to cough but later restarted ARB. ?Home readings #s: not frequently ?BP Readings from Last 3 Encounters:  ?06/11/21 132/72  ?01/27/21 120/78  ?09/23/20 (!) 158/82  ?A/P: Controlled. Continue current medications.  ? ? ?#Headache ?S: Ongoing issues with headaches-at last visit was even considering retirement as has felt they were stress related.  Have recommended neurology visit but she has declined in the past. Having to do 12 hour shifts and work saturdays lately- stress has not decreased since last visit- still getting headaches that can last a day or two 4x a month- does push through many  times even when has the headaches ?-still considering retirement ?- also offered MRI of brain and she declines ?-Had negative temporal artery biopsy a few years ago. No vision loss reported ?-Has seen ophthalmology ? ?Has a family for 4 times per month 1 to 2 days for the headaches.  Ice and rest seems to help-prior Tylenol or Advil was not effective at last visit ?A/P: ongoing headaches- she thinks stress related- declines MRI and neurology- hoping work stress will improve- she is not really getting many breaks in her schedule.   ? ?#Skin lesion-patient reports a mole on her side that she reports is growing- may be seborrheic keratosis but with ongoing growth want to be  evaluated by derm- she is ok with referral to dermatology specialists   ? ?#Health maintenance-recommended Prevnar 38- opts in ?-Recommended Shingrix- wants to hold for now  ?-She will sign release of information for last diabetic eye exam ? ?Recommended follow up: Return in about 4 months (around 10/11/2021) for physical or sooner if needed.Schedule b4 you leave. ? ?Lab/Order associations: ?  ICD-10-CM   ?1. Hyperlipidemia associated with type 2 diabetes mellitus (HCC)  E11.69 CBC with Differential/Platelet  ? E78.5 Comprehensive metabolic panel  ?  ?2. Primary hypertension  I10   ?  ?3. Controlled type 2 diabetes mellitus without complication, without long-term current use of insulin (HCC)  E11.9 CBC with Differential/Platelet  ?  Comprehensive metabolic panel  ?  Hemoglobin A1c  ?  ?4. Skin lesion  L98.9 Ambulatory referral to Dermatology  ?  ? ? ?No orders of the defined types were placed in this encounter. ? ? ?Return precautions advised.  ?Garret Reddish, MD ? ? ?

## 2021-05-30 ENCOUNTER — Other Ambulatory Visit: Payer: Self-pay | Admitting: Family Medicine

## 2021-06-11 ENCOUNTER — Ambulatory Visit: Payer: Federal, State, Local not specified - PPO | Admitting: Family Medicine

## 2021-06-11 ENCOUNTER — Encounter: Payer: Self-pay | Admitting: Family Medicine

## 2021-06-11 VITALS — BP 132/72 | HR 62 | Temp 97.7°F | Ht 64.0 in | Wt 168.4 lb

## 2021-06-11 DIAGNOSIS — E785 Hyperlipidemia, unspecified: Secondary | ICD-10-CM

## 2021-06-11 DIAGNOSIS — E1169 Type 2 diabetes mellitus with other specified complication: Secondary | ICD-10-CM | POA: Diagnosis not present

## 2021-06-11 DIAGNOSIS — I10 Essential (primary) hypertension: Secondary | ICD-10-CM | POA: Diagnosis not present

## 2021-06-11 DIAGNOSIS — E119 Type 2 diabetes mellitus without complications: Secondary | ICD-10-CM

## 2021-06-11 DIAGNOSIS — L989 Disorder of the skin and subcutaneous tissue, unspecified: Secondary | ICD-10-CM

## 2021-06-11 DIAGNOSIS — Z23 Encounter for immunization: Secondary | ICD-10-CM | POA: Diagnosis not present

## 2021-06-11 LAB — COMPREHENSIVE METABOLIC PANEL
ALT: 24 U/L (ref 0–35)
AST: 18 U/L (ref 0–37)
Albumin: 4.3 g/dL (ref 3.5–5.2)
Alkaline Phosphatase: 62 U/L (ref 39–117)
BUN: 19 mg/dL (ref 6–23)
CO2: 27 mEq/L (ref 19–32)
Calcium: 9.7 mg/dL (ref 8.4–10.5)
Chloride: 101 mEq/L (ref 96–112)
Creatinine, Ser: 0.88 mg/dL (ref 0.40–1.20)
GFR: 68.48 mL/min (ref >60.00)
Glucose, Bld: 121 mg/dL — ABNORMAL HIGH (ref 70–99)
Potassium: 3.9 mEq/L (ref 3.5–5.1)
Sodium: 139 mEq/L (ref 135–145)
Total Bilirubin: 0.4 mg/dL (ref 0.2–1.2)
Total Protein: 7.5 g/dL (ref 6.0–8.3)

## 2021-06-11 LAB — CBC WITH DIFFERENTIAL/PLATELET
Basophils Absolute: 0.1 10*3/uL (ref 0.0–0.1)
Basophils Relative: 2.2 % (ref 0.0–3.0)
Eosinophils Absolute: 0.2 10*3/uL (ref 0.0–0.7)
Eosinophils Relative: 3.8 % (ref 0.0–5.0)
HCT: 42.2 % (ref 36.0–46.0)
Hemoglobin: 13.9 g/dL (ref 12.0–15.0)
Lymphocytes Relative: 38 % (ref 12.0–46.0)
Lymphs Abs: 1.7 10*3/uL (ref 0.7–4.0)
MCHC: 32.9 g/dL (ref 30.0–36.0)
MCV: 86.5 fl (ref 78.0–100.0)
Monocytes Absolute: 0.5 10*3/uL (ref 0.1–1.0)
Monocytes Relative: 10.1 % (ref 3.0–12.0)
Neutro Abs: 2.1 10*3/uL (ref 1.4–7.7)
Neutrophils Relative %: 45.9 % (ref 43.0–77.0)
Platelets: 262 10*3/uL (ref 150.0–400.0)
RBC: 4.87 Mil/uL (ref 3.87–5.11)
RDW: 14.3 % (ref 11.5–15.5)
WBC: 4.5 10*3/uL (ref 4.0–10.5)

## 2021-06-11 LAB — HEMOGLOBIN A1C: Hgb A1c MFr Bld: 7.7 % — ABNORMAL HIGH (ref 4.6–6.5)

## 2021-06-11 NOTE — Patient Instructions (Addendum)
Health Maintenance Due  ?Topic Date Due  ? Pneumonia Vaccine 23+ Years old (2 - PCV)- today 09/29/2016  ? OPHTHALMOLOGY EXAM -sign release of information at check out. 04/24/2020  ? ?Please stop by lab before you go ?If you have mychart- we will send your results within 3 business days of [place] receiving them.  ?If you do not have mychart- we will call you about results within 5 business days of [place] receiving them.  ?*please also note that you will see labs on mychart as soon as they post. I will later go in and write notes on them- will say "notes from Dr. Yong Channel"  ? ?Recommended follow up: Return in about 4 months (around 10/11/2021) for physical or sooner if needed.Schedule b4 you leave.   ?

## 2021-06-11 NOTE — Addendum Note (Signed)
Addended by: Clyde Lundborg A on: 06/11/2021 09:12 AM ? ? Modules accepted: Orders ? ?

## 2021-07-01 ENCOUNTER — Other Ambulatory Visit: Payer: Self-pay | Admitting: Family Medicine

## 2021-07-06 ENCOUNTER — Other Ambulatory Visit: Payer: Self-pay | Admitting: Cardiovascular Disease

## 2021-07-20 DIAGNOSIS — L578 Other skin changes due to chronic exposure to nonionizing radiation: Secondary | ICD-10-CM | POA: Diagnosis not present

## 2021-07-20 DIAGNOSIS — L821 Other seborrheic keratosis: Secondary | ICD-10-CM | POA: Diagnosis not present

## 2021-07-20 DIAGNOSIS — L814 Other melanin hyperpigmentation: Secondary | ICD-10-CM | POA: Diagnosis not present

## 2021-07-20 DIAGNOSIS — D1801 Hemangioma of skin and subcutaneous tissue: Secondary | ICD-10-CM | POA: Diagnosis not present

## 2021-08-02 ENCOUNTER — Other Ambulatory Visit: Payer: Self-pay

## 2021-08-02 MED ORDER — LOSARTAN POTASSIUM 100 MG PO TABS
100.0000 mg | ORAL_TABLET | Freq: Every day | ORAL | 0 refills | Status: DC
Start: 2021-08-02 — End: 2021-08-06

## 2021-08-06 ENCOUNTER — Other Ambulatory Visit: Payer: Self-pay

## 2021-08-06 MED ORDER — LOSARTAN POTASSIUM 100 MG PO TABS: 100.0000 mg | ORAL_TABLET | Freq: Every day | ORAL | 0 refills | Status: DC

## 2021-08-06 MED ORDER — METOPROLOL SUCCINATE ER 25 MG PO TB24: 12.5000 mg | ORAL_TABLET | Freq: Every day | ORAL | 0 refills | Status: DC

## 2021-08-09 ENCOUNTER — Other Ambulatory Visit: Payer: Self-pay

## 2021-08-30 ENCOUNTER — Other Ambulatory Visit: Payer: Self-pay

## 2021-08-30 MED ORDER — METOPROLOL SUCCINATE ER 25 MG PO TB24: 12.5000 mg | ORAL_TABLET | Freq: Every day | ORAL | 0 refills | Status: DC

## 2021-09-03 ENCOUNTER — Ambulatory Visit: Payer: Federal, State, Local not specified - PPO | Admitting: Cardiovascular Disease

## 2021-09-04 ENCOUNTER — Other Ambulatory Visit: Payer: Self-pay | Admitting: Cardiovascular Disease

## 2021-09-06 ENCOUNTER — Other Ambulatory Visit: Payer: Self-pay

## 2021-09-06 MED ORDER — LOSARTAN POTASSIUM 100 MG PO TABS: 100.0000 mg | ORAL_TABLET | Freq: Every day | ORAL | 0 refills | Status: DC

## 2021-09-12 ENCOUNTER — Encounter: Payer: Self-pay | Admitting: Cardiovascular Disease

## 2021-09-12 NOTE — Progress Notes (Signed)
Cardiology Office Note:    Date:  09/13/2021   ID:  Dana Mcmahon, Dana Mcmahon 06/18/1955, MRN [id number]  PCP:  Marin Olp, MD  Cardiologist:  Arch Methot   Problem List 1. Palpitations  2.  Diabetes mellitus 3.  Hyperlipidemia   Referring MD: Marin Olp, MD   Chief Complaint  Patient presents with   Hypertension    Prior notes:     Dana Mcmahon is a 66 y.o. female with a hx of palpitations. Feels her Heart racing Associated with chest heaviness Last for several minutes, Different times of the day .   Has woken her up from sleep on occasion Exercises reguarly - walks on the treadmill - ,  No issues while working out, she occasionally symptoms right after working out.  These are associated with some mild lightheadedness.  Similar palpitations in 2016 - Holter showed PACs and PVCs   August 07, 2017:  Dana Mcmahon is seen today for follow up of her PVCs Tried toprol XL.  Has lots of fatigue.   No energy to do much  Not exercisig     March 08, 2019:  Dana Mcmahon is seen today for follow-up of her prior palpitations.  She is had a Holter monitor that showed premature atrial contractions and premature ventricular contractions.  I saw her a year ago.  At that time we had her on low-dose Toprol-XL which caused some fatigue.  June 05, 2020: Dana Mcmahon is seen today for follow up of her palpitatios /PVCs.and PACs BP is slightly elevated.  Has mild leg edema - is on amlodipine  Still eats salt on occasion  Has tried HCTZ but she developed gout in 2015  Encourage more cardio exercise   September 13, 2021 Dana Mcmahon is seen for follow up of her hypertension and palpitations. Was on a mission trip this past weekend  - got in around midnight.  Very little sleep over the weekend  Is fatigued . As a result, her BP is elevated.   She has been trying to eat healthy.   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   BIOTIN PO Take 1 tablet by mouth daily.   Cholecalciferol (VITAMIN D3 PO) Take 1 capsule by mouth daily.   diphenhydrAMINE HCl, Sleep, 50 MG CAPS Take 50 mg by mouth at bedtime.    ELDERBERRY PO Take by mouth.   fluticasone (FLONASE) 50 MCG/ACT nasal spray Place 1 spray into both nostrils daily as needed.   glucose blood (ACCU-CHEK GUIDE) test strip Check  blood sugar three times daily   Lancets (ACCU-CHEK MULTICLIX) lancets Check blood sugar three times daily   metFORMIN (GLUCOPHAGE-XR) 500 MG 24 hr tablet TAKE 1 TABLET(500 MG) BY MOUTH DAILY WITH BREAKFAST   Multiple Vitamin (MULTIVITAMIN WITH MINERALS) TABS tablet Take 1 tablet by mouth daily.   rosuvastatin (CRESTOR) 5 MG tablet TAKE 1 TABLET(5 MG) BY MOUTH 1 TIME A WEEK   vitamin E 1000 UNIT capsule Take 1,000 Units by mouth daily.   [DISCONTINUED] losartan (COZAAR) 100 MG tablet Take 1 tablet (100 mg total) by mouth daily. CAN GET REFILLS AT UPCOMING OFFICE VISIT.   [DISCONTINUED] metoprolol succinate (TOPROL-XL) 25 MG 24 hr tablet Take 0.5 tablets (12.5 mg total) by mouth daily. Please keep upcoming appt in July 2023 with Dr. Acie Fredrickson before anymore refills. Thank you Final Attempt     Allergies:   Codeine,  Penicillins, and Latex   Social History   Socioeconomic History   Marital status: Married    Spouse name: Not on file   Number of children: Not on file   Years of education: Not on file   Highest education level: Not on file  Occupational History   Not on file  Tobacco Use   Smoking status: Never   Smokeless tobacco: Never  Vaping Use   Vaping Use: Never used  Substance and Sexual Activity   Alcohol use: No   Drug use: No   Sexual activity: Not Currently    Partners: Male  Other Topics Concern   Not on file  Social History Narrative   Married, 4 children (daughter Charisma patient here, husband Patsy [place], son Sidney). 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      Hobbies: time with family, watching news, reading            Social Determinants of Health   Financial Resource Strain: Not on file  Food Insecurity: Not on file  Transportation Needs: Not on file  Physical Activity: Not on file  Stress: Not on file  Social Connections: Not on file     Family History: The patient's family history includes Hypertension in her mother; Stroke in her brother and mother. There is no history of Colon cancer, Esophageal cancer, Rectal cancer, or Stomach cancer.  ROS:   Please see the history of present illness.     All other systems reviewed and are negative.  EKGs/Labs/Other Studies Reviewed:    The following studies were reviewed today:     Recent Labs: 06/11/2021: ALT 24; BUN 19; Creatinine, Ser 0.88; Hemoglobin 13.9; Platelets 262.0; Potassium 3.9; Sodium 139  Recent Lipid Panel    Component Value Date/Time   CHOL 239 (H) 09/23/2020 0951   TRIG (H) 09/23/2020 0951    548.0 Triglyceride is over 400; calculations on Lipids are invalid.   HDL 45.60 09/23/2020 0951   CHOLHDL 5 09/23/2020 0951   VLDL 72.6 (H) 04/08/2019 1057   LDLDIRECT 88.0 09/23/2020 0951    Physical Exam:      Physical Exam: Blood pressure (!) 142/84, pulse 66, height '5\' 6"'$  (1.676 m), weight 166 lb (75.3 kg), SpO2 93 %.  GEN:  Well nourished, well developed in no acute distress HEENT: Normal NECK: No JVD; No carotid bruits LYMPHATICS: No lymphadenopathy CARDIAC: RRR , no murmurs, rubs, gallops RESPIRATORY:  Clear to auscultation without rales, wheezing or rhonchi  ABDOMEN: Soft, non-tender, non-distended MUSCULOSKELETAL:  No edema; No deformity  SKIN: Warm and dry NEUROLOGIC:  Alert and oriented x 3    EKG: September 13, 2021: Normal sinus rhythm at 66.  Incomplete right bundle branch block.  No changes from previous EKG.  ASSESSMENT:    1. Primary hypertension   2. Hyperlipidemia associated with type 2 diabetes mellitus ([place])   3. Hypertension associated with diabetes ([place])     PLAN:       1.  Palpitations - palpitations are better   2.  Hyperlipidemia.  :  stable.  Cont rosuvastatin   3.  HTn:   BP is slightly elevated today.  She has been on a mission trip this past weekend and has not gotten much sleep.  In addition, she got into town last night around midnight.  I suspect that her blood pressure will come down as she gets more rest later this week.  Medication Adjustments/Labs and Tests Ordered: Current medicines are reviewed at length with the patient today.  Concerns regarding medicines are outlined above.  Orders Placed This Encounter  Procedures   EKG 12-Lead   Meds ordered this encounter  Medications   losartan (COZAAR) 100 MG tablet    Sig: Take 1 tablet (100 mg total) by mouth daily.    Dispense:  90 tablet    Refill:  3   metoprolol succinate (TOPROL-XL) 25 MG 24 hr tablet    Sig: Take 0.5 tablets (12.5 mg total) by mouth daily.    Dispense:  90 tablet    Refill:  3   Will have her see Dr. Johney Frame or Margaretann Loveless upon my discharge   Signed, Mertie Moores, MD  09/13/2021 10:00 AM    [place]

## 2021-09-13 ENCOUNTER — Encounter: Payer: Self-pay | Admitting: Cardiovascular Disease

## 2021-09-13 ENCOUNTER — Ambulatory Visit: Payer: Federal, State, Local not specified - PPO | Admitting: Cardiovascular Disease

## 2021-09-13 VITALS — BP 142/84 | HR 66 | Ht 66.0 in | Wt 166.0 lb

## 2021-09-13 DIAGNOSIS — E1169 Type 2 diabetes mellitus with other specified complication: Secondary | ICD-10-CM

## 2021-09-13 DIAGNOSIS — I10 Essential (primary) hypertension: Secondary | ICD-10-CM | POA: Diagnosis not present

## 2021-09-13 DIAGNOSIS — E1159 Type 2 diabetes mellitus with other circulatory complications: Secondary | ICD-10-CM

## 2021-09-13 DIAGNOSIS — I152 Hypertension secondary to endocrine disorders: Secondary | ICD-10-CM

## 2021-09-13 DIAGNOSIS — E785 Hyperlipidemia, unspecified: Secondary | ICD-10-CM | POA: Diagnosis not present

## 2021-09-13 MED ORDER — METOPROLOL SUCCINATE ER 25 MG PO TB24: 12.5000 mg | ORAL_TABLET | Freq: Every day | ORAL | 3 refills | Status: DC

## 2021-09-13 MED ORDER — LOSARTAN POTASSIUM 100 MG PO TABS: 100.0000 mg | ORAL_TABLET | Freq: Every day | ORAL | 3 refills | Status: DC

## 2021-09-13 NOTE — Patient Instructions (Signed)
Medication Instructions:  ° °Your physician recommends that you continue on your current medications as directed. Please refer to the Current Medication list given to you today. ° °*If you need a refill on your cardiac medications before your next appointment, please call your pharmacy* ° ° ° °Follow-Up: °At [facility], you and your health needs are our priority.  As part of our continuing mission to provide you with exceptional heart care, we have created designated Provider Care Teams.  These Care Teams include your primary Cardiologist (physician) and Advanced Practice Providers (APPs -  Physician Assistants and Nurse Practitioners) who all work together to provide you with the care you need, when you need it. ° °We recommend signing up for the patient portal called "MyChart".  Sign up information is provided on this After Visit Summary.  MyChart is used to connect with patients for Virtual Visits (Telemedicine).  Patients are able to view lab/test results, encounter notes, upcoming appointments, etc.  Non-urgent messages can be sent to your provider as well.   °To learn more about what you can do with MyChart, go to [email or web address].   ° °Your next appointment:   °1 year(s) ° °The format for your next appointment:   °In Person ° °Provider:   °Philip Nahser, MD { ° ° °

## 2021-09-17 ENCOUNTER — Encounter: Payer: Self-pay | Admitting: Gastroenterology

## 2021-11-12 ENCOUNTER — Encounter: Payer: Self-pay | Admitting: Radiology

## 2021-11-12 ENCOUNTER — Ambulatory Visit: Payer: Federal, State, Local not specified - PPO | Admitting: Radiology

## 2021-11-12 VITALS — BP 144/86

## 2021-11-12 DIAGNOSIS — N898 Other specified noninflammatory disorders of vagina: Secondary | ICD-10-CM

## 2021-11-12 DIAGNOSIS — B9689 Other specified bacterial agents as the cause of diseases classified elsewhere: Secondary | ICD-10-CM | POA: Diagnosis not present

## 2021-11-12 DIAGNOSIS — N760 Acute vaginitis: Secondary | ICD-10-CM | POA: Diagnosis not present

## 2021-11-12 LAB — WET PREP FOR TRICH, YEAST, CLUE

## 2021-11-12 MED ORDER — METRONIDAZOLE 500 MG PO TABS: 500.0000 mg | ORAL_TABLET | Freq: Two times a day (BID) | ORAL | 0 refills | Status: DC

## 2021-11-12 NOTE — Progress Notes (Signed)
      Subjective: Dana Mcmahon is a 66 y.o. female who complains of vaginal itching x 1 month, slight increase in discharge. No other symptoms. No new soap or laundry products, wears white cotton underwear.  Review of Systems  All other systems reviewed and are negative.   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   -General: no acute distress -Vulva: without lesions or discharge -Vagina:thin white discharge present, wet prep obtained -Cervix: no lesion or discharge, no CMT -Perineum: no lesions -Uterus: Mobile, non tender -Adnexa: no masses or tenderness   Microscopic wet-mount exam shows clue cells.   Chaperone offered and declined.  Assessment:/Plan:   1. Vaginal itching  - WET PREP FOR TRICH, YEAST, CLUE  2. BV (bacterial vaginosis)  - metroNIDAZOLE (FLAGYL) 500 MG tablet; Take 1 tablet (500 mg total) by mouth 2 (two) times daily.  Dispense: 14 tablet; Refill: 0    Will contact patient with results of testing completed today. Avoid intercourse until symptoms are resolved. Safe sex encouraged. Avoid the use of soaps or perfumed products in the peri area. Avoid tub baths and sitting in sweaty or wet clothing for prolonged periods of time.

## 2021-11-17 ENCOUNTER — Other Ambulatory Visit: Payer: Self-pay | Admitting: Family Medicine

## 2021-11-17 DIAGNOSIS — Z1231 Encounter for screening mammogram for malignant neoplasm of breast: Secondary | ICD-10-CM

## 2021-11-26 ENCOUNTER — Ambulatory Visit (INDEPENDENT_AMBULATORY_CARE_PROVIDER_SITE_OTHER): Payer: Federal, State, Local not specified - PPO | Admitting: Family Medicine

## 2021-11-26 ENCOUNTER — Encounter: Payer: Self-pay | Admitting: Family Medicine

## 2021-11-26 VITALS — BP 138/78 | HR 71 | Temp 98.1°F | Ht 66.0 in | Wt 170.2 lb

## 2021-11-26 DIAGNOSIS — E1169 Type 2 diabetes mellitus with other specified complication: Secondary | ICD-10-CM

## 2021-11-26 DIAGNOSIS — E785 Hyperlipidemia, unspecified: Secondary | ICD-10-CM

## 2021-11-26 DIAGNOSIS — Z1211 Encounter for screening for malignant neoplasm of colon: Secondary | ICD-10-CM

## 2021-11-26 DIAGNOSIS — E119 Type 2 diabetes mellitus without complications: Secondary | ICD-10-CM | POA: Diagnosis not present

## 2021-11-26 DIAGNOSIS — I10 Essential (primary) hypertension: Secondary | ICD-10-CM

## 2021-11-26 DIAGNOSIS — Z0000 Encounter for general adult medical examination without abnormal findings: Secondary | ICD-10-CM | POA: Diagnosis not present

## 2021-11-26 LAB — CBC WITH DIFFERENTIAL/PLATELET
Basophils Absolute: 0.1 10*3/uL (ref 0.0–0.1)
Basophils Relative: 1.9 % (ref 0.0–3.0)
Eosinophils Absolute: 0.2 10*3/uL (ref 0.0–0.7)
Eosinophils Relative: 4.7 % (ref 0.0–5.0)
HCT: 41.3 % (ref 36.0–46.0)
Hemoglobin: 13.7 g/dL (ref 12.0–15.0)
Lymphocytes Relative: 34.2 % (ref 12.0–46.0)
Lymphs Abs: 1.5 10*3/uL (ref 0.7–4.0)
MCHC: 33.1 g/dL (ref 30.0–36.0)
MCV: 86.3 fl (ref 78.0–100.0)
Monocytes Absolute: 0.4 10*3/uL (ref 0.1–1.0)
Monocytes Relative: 10 % (ref 3.0–12.0)
Neutro Abs: 2.1 10*3/uL (ref 1.4–7.7)
Neutrophils Relative %: 49.2 % (ref 43.0–77.0)
Platelets: 260 10*3/uL (ref 150.0–400.0)
RBC: 4.79 Mil/uL (ref 3.87–5.11)
RDW: 14.4 % (ref 11.5–15.5)
WBC: 4.3 10*3/uL (ref 4.0–10.5)

## 2021-11-26 LAB — MICROALBUMIN / CREATININE URINE RATIO
Creatinine,U: 81.3 mg/dL
Microalb Creat Ratio: 13.3 mg/g (ref 0.0–30.0)
Microalb, Ur: 10.8 mg/dL — ABNORMAL HIGH (ref 0.0–1.9)

## 2021-11-26 LAB — COMPREHENSIVE METABOLIC PANEL
ALT: 19 U/L (ref 0–35)
AST: 17 U/L (ref 0–37)
Albumin: 4.2 g/dL (ref 3.5–5.2)
Alkaline Phosphatase: 68 U/L (ref 39–117)
BUN: 14 mg/dL (ref 6–23)
CO2: 30 mEq/L (ref 19–32)
Calcium: 9.3 mg/dL (ref 8.4–10.5)
Chloride: 104 mEq/L (ref 96–112)
Creatinine, Ser: 0.76 mg/dL (ref 0.40–1.20)
GFR: 81.39 mL/min (ref >60.00)
Glucose, Bld: 140 mg/dL — ABNORMAL HIGH (ref 70–99)
Potassium: 4.3 mEq/L (ref 3.5–5.1)
Sodium: 142 mEq/L (ref 135–145)
Total Bilirubin: 0.6 mg/dL (ref 0.2–1.2)
Total Protein: 7.5 g/dL (ref 6.0–8.3)

## 2021-11-26 LAB — LDL CHOLESTEROL, DIRECT: Direct LDL: 106 mg/dL

## 2021-11-26 LAB — LIPID PANEL
Cholesterol: 212 mg/dL — ABNORMAL HIGH (ref 0–200)
HDL: 48.3 mg/dL (ref >39.00)
NonHDL: 163.27
Total CHOL/HDL Ratio: 4
Triglycerides: 392 mg/dL — ABNORMAL HIGH (ref 0.0–149.0)
VLDL: 78.4 mg/dL — ABNORMAL HIGH (ref 0.0–40.0)

## 2021-11-26 LAB — HEMOGLOBIN A1C: Hgb A1c MFr Bld: 8.1 % — ABNORMAL HIGH (ref 4.6–6.5)

## 2021-11-26 MED ORDER — METFORMIN HCL ER 500 MG PO TB24: 500.0000 mg | ORAL_TABLET | Freq: Two times a day (BID) | ORAL | 3 refills | Status: DC

## 2021-11-26 NOTE — Progress Notes (Signed)
Phone [phone]   Subjective:  Patient presents today for their annual physical. Chief complaint-noted.   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   Right   Patient Active Problem List   Diagnosis Date Noted   Diabetes mellitus type II, controlled ([place]) 05/10/2013    Priority: High   Headache, unspecified headache type 06/03/2017    Priority: Medium    History of adenomatous polyp of colon 07/25/2016    Priority: Medium    Upper airway cough syndrome 01/22/2016    Priority: Medium    Hyperlipidemia associated with type 2 diabetes mellitus ([place])     Priority: Medium    Primary hypertension 05/10/2013    Priority: Medium    Palpitations 05/10/2013    Priority: Medium    COVID-19 vaccine series declined 03/16/2020    Priority: Low   Osteoarthritis, hand 12/16/2014    Priority: Low   Allergic rhinitis 07/24/2014    Priority: Low   Obesity, Class I, BMI 30-34.9     Priority: Low   Dizzy spells 08/08/2013    Priority: Low   Past Surgical History:  Procedure Laterality Date   ARTERY BIOPSY Left 06/13/2017   Procedure: LEFT TEMPORAL ARTERY BIOPSY;  Surgeon: Johnathan Hausen, MD;  Location: [facility];  Service: General;  Laterality: Left;   COLONOSCOPY     none      Family History  Problem Relation Age of Onset   Stroke Mother        33s, and father 57s   Hypertension Mother        and father    Stroke Brother        in 65s   Colon cancer Neg Hx    Esophageal cancer Neg Hx    Rectal cancer Neg Hx    Stomach cancer Neg Hx     Medications- reviewed and updated Current Outpatient Medications  Medication Sig Dispense Refill   amLODipine (NORVASC) 2.5 MG tablet TAKE 1 TABLET(2.5 MG) BY MOUTH DAILY 30 tablet 5   Ascorbic Acid (VITAMIN C PO) Take 1 tablet by mouth daily.     BIOTIN PO Take 1 tablet by mouth daily.     Cholecalciferol (VITAMIN D3 PO) Take 1 capsule by mouth daily.     diphenhydrAMINE HCl, Sleep, 50 MG CAPS Take 50 mg by mouth at bedtime.      ELDERBERRY PO Take by mouth.     fluticasone (FLONASE) 50 MCG/ACT nasal spray Place 1 spray into both nostrils daily as needed.     glucose blood (ACCU-CHEK GUIDE) test strip Check blood sugar three times daily 100 each 11   Lancets (ACCU-CHEK MULTICLIX) lancets Check blood sugar three times daily 100 each 11   losartan (COZAAR) 100 MG tablet Take 1 tablet (100 mg total) by mouth daily. 90 tablet 3   metoprolol succinate (  TOPROL-XL) 25 MG 24 hr tablet Take 0.5 tablets (12.5 mg total) by mouth daily. 90 tablet 3   Multiple Vitamin (MULTIVITAMIN WITH MINERALS) TABS tablet Take 1 tablet by mouth daily.     rosuvastatin (CRESTOR) 5 MG tablet TAKE 1 TABLET(5 MG) BY MOUTH 1 TIME A WEEK 13 tablet 3   vitamin E 1000 UNIT capsule Take 1,000 Units by mouth daily.     metFORMIN (GLUCOPHAGE-XR) 500 MG 24 hr tablet Take 1 tablet (500 mg total) by mouth 2 (two) times daily with a meal. 180 tablet 3   No current facility-administered medications for this visit.    Allergies-reviewed and updated Allergies  Allergen Reactions   Codeine Nausea Only   Penicillins Nausea And Vomiting, Swelling and Other (See Comments)    Has patient had a PCN reaction causing immediate rash, facial/tongue/throat swelling, SOB or lightheadedness with hypotension: Yes Has patient had a PCN reaction causing severe rash involving mucus membranes or skin necrosis:  No Has patient had a PCN reaction that required hospitalization: No Has patient had a PCN reaction occurring within the last 10 years: No If all of the above answers are "NO", then may proceed with Cephalosporin use.    Latex Rash    Social History   Social History Narrative   Married, 4 children (daughter Charisma patient here, husband Patsy [place], son [place]). 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      Hobbies: time with family, watching news, reading            Objective  Objective:  BP 138/78   Pulse 71   Temp 98.1 F (36.7 C)   Ht '5\' 6"'$  (1.676 m)   Wt 170 lb 3.2 oz (77.2 kg)   LMP  (LMP Unknown)   SpO2 96%   BMI 27.47 kg/m  Gen: NAD, resting comfortably HEENT: Mucous membranes are moist. Oropharynx normal Neck: no thyromegaly CV: RRR no murmurs rubs or gallops Lungs: CTAB no crackles, wheeze, rhonchi Abdomen: soft/nontender/nondistended/normal bowel sounds. No rebound or guarding.  Ext: trace edema Skin: warm, dry Neuro: grossly normal, moves all extremities, PERRLA   Assessment and Plan   66 y.o. female presenting for annual physical.  Health Maintenance counseling: 1. Anticipatory guidance: Patient counseled regarding regular dental exams -q6 months, eye exams - is going to check and make sure she is scheduled,  avoiding smoking and second hand smoke , limiting alcohol to 1 beverage per day- doesn't drink , no illicit drugs .   2. Risk factor reduction:  Advised patient of need for regular exercise and diet rich and fruits and vegetables to reduce risk of heart attack and stroke.  Exercise- hard to do with schedule- occasionally does 10 minutes of stretching.  Diet/weight management-overweight per BMI-also up 2 pounds from last visit here- encouraged healthy diet Wt Readings from Last 3 Encounters:  11/26/21 170 lb 3.2 oz (77.2 kg)  09/13/21 166 lb (75.3 kg)  06/11/21 168 lb  6.4 oz (76.4 kg)  3. Immunizations/screenings/ancillary studies-declines Shingrix for now (could get at pharmacy along with flu) and covid  Immunization History  Administered Date(s) Administered   Fluad Quad(high Dose 65+) 01/27/2021   Hep A / Hep B 04/16/2015, 06/22/2015, 01/29/2016   Influenza Inj Mdck Quad Pf 11/23/2018   Influenza Whole 01/19/2016   Influenza, High Dose Seasonal PF 03/26/2020   Influenza,inj,Quad PF,6+ Mos 01/16/2018  Influenza-Unspecified 11/26/2014, 12/19/2016   PNEUMOCOCCAL CONJUGATE-20 06/11/2021   Pneumococcal Polysaccharide-23 09/30/2015   Tdap 07/24/2014  4. Cervical cancer screening- past age based screening recommendations- still sees GYN 5. Breast cancer screening-  breast exam with GYN and mammogram 12/25/2020- already scheduled for this year 6. Colon cancer screening - last done in 2018-due for 5-year repeat which was ordered at this time 7. Skin cancer screening- does not see derm- saw [facility]- had one spot on side evaluated and they did not recommend removal- itching is better.  advised regular sunscreen use. Denies worrisome, changing, or new skin lesions.  8. Birth control/STD check- not active due tos chedule with husband and postmenopausal 9. Osteoporosis screening at 32- 12/25/2020 with osteopenia-recommended calcium/vitamin D/weightbearing exercise and repeat next year or 2025  10. Smoking associated screening - never smoker  Status of chronic or acute concerns   #social update- upcoming reunion in the [place]  # Diabetes S: compliant with metformin '500mg'$  daily XR . Was having diarrhea on higher doses.  -Plan was to add Januvia but we were unable to contact patient after last visit-did send micro but we did not hear back from her  Lab Results  Component Value Date   HGBA1C 7.7 (H) 06/11/2021   HGBA1C 7.8 (H) 01/27/2021   HGBA1C 8.1 (H) 09/23/2020  A/P:poor control last visit- update a1c but go ahead and increase to  metformin XR twice daily- hopefully stomach tolerates -consider januvia if needed -4 month follow up   -Verbally requested her sign a release of information at checkout   Hyperlipidemia  S: Compliant with rosuvastatin 5 mg once a week.  Has declined further increases in medicine   Lab Results  Component Value Date   CHOL 239 (H) 09/23/2020   HDL 45.60 09/23/2020   LDLDIRECT 88.0 09/23/2020   TRIG (H) 09/23/2020    548.0 Triglyceride is over 400; calculations on Lipids are invalid.   CHOLHDL 5 09/23/2020   A/P: Poor control of triglycerides-needs aggressive lifestyle changes and also recommended increasing medicine but she declines  Hypertension S:compliant with amlodipine 2.5 mg once a day, losartan 100 mg, and metoprolol '25mg'$  extended release.    Stopped HCTZ due to potentially causing gout.  Stopped ACE inhibitor in the past due to cough but later restarted ARB.  Home #s- 130s-150s/70s BP Readings from Last 3 Encounters:  11/26/21 138/78  11/12/21 (!) 144/86  09/13/21 (!) 142/84   A/P:  blood pressure  high- recommended increasing amlodipine to reduce long term CV risk- she declines  # Headache, unspecified headache type S: still getting some stress headaches mainly on the left side. advil or tylenol helps.  Had negative temporal artery biopsy a few years ago.  No vision loss reported -Ongoing issues at last visit likely stress related-declined neurology and MRI at that time-see full note 06/11/2021 -ongoing headaches worse with work stress A/P: with ongoing headaches and worsened with work stress- if feasible I honestly asked her to consider retirement. I really hope trip to [place] will be a big refresher for her - she is not sure this is possible in her life right now- we will check back in for a visit if struggling and see if we can make any accommodations  . Headaches much better on days off -working 68 hours a week - 12 hours a day 5 days a week, 8 hours on Saturday -30  minute commute- we wonder if working from home may help with overall fatigue if possible -declines mri  or neurology  Recommended follow up: Return in about 4 months (around 03/29/2022) for followup or sooner if needed.Schedule b4 you leave. Future Appointments  Date Time Provider [place]  12/31/2021  5:00 PM [facility] 3 [facility]   Lab/Order associations: fasting   ICD-10-CM   1. Preventative health care  Z00.00 Microalbumin / creatinine urine ratio    CBC with Differential/Platelet    Comprehensive metabolic panel    Lipid panel    Hemoglobin A1c    2. Controlled type 2 diabetes mellitus without complication, without long-term current use of insulin (HCC)  E11.9 Microalbumin / creatinine urine ratio    CBC with Differential/Platelet    Comprehensive metabolic panel    Lipid panel    Hemoglobin A1c    3. Screen for colon cancer  Z12.11 Ambulatory referral to Gastroenterology    4. Primary hypertension  I10     5. Hyperlipidemia associated with type 2 diabetes mellitus (HCC)  E11.69    E78.5       Meds ordered this encounter  Medications   metFORMIN (GLUCOPHAGE-XR) 500 MG 24 hr tablet    Sig: Take 1 tablet (500 mg total) by mouth 2 (two) times daily with a meal.    Dispense:  180 tablet    Refill:  3    Return precautions advised.  Garret Reddish, MD

## 2021-11-26 NOTE — Patient Instructions (Addendum)
Have mammogram results faxed to [place] at [phone] when you have it done next month.  Call and get diabetic eye exam scheduled. If have had one within las tyear have them fax [place]  Let us know when you get your flu shot.  If schedule becomes unmanageable- schedule and we can talk about potential accommodations- with your headaches would love for you to cut back on hours or even retire if possible -you declined neurology and mri follow up   [facility] contact Please call to schedule visit and/or procedure Address: [address], [place], [place] [address] Phone: [phone]   Please stop by lab before you go If you have mychart- we will send your results within 3 business days of [place] receiving them.  If you do not have mychart- we will call you about results within 5 business days of [place] receiving them.  *please also note that you will see labs on mychart as soon as they post. I will later go in and write notes on them- will say "notes from Dr. Yong Channel"   Recommended follow up: Return in about 4 months (around 03/29/2022) for followup or sooner if needed.Schedule b4 you leave.

## 2021-11-29 ENCOUNTER — Telehealth: Payer: Self-pay | Admitting: *Deleted

## 2021-11-29 NOTE — Telephone Encounter (Signed)
Patient called c/o vaginal itching internal and external. She completed Flagyl Rx, reports the itching never really went away from visit. I suggested she try otc monistat can be used internal and external. Patient also mentioned when taking Flagyl she would have blood in her mouth in the am, reports the blood didn't appear to come from her teeth or gum but would noticed when she spit in the sink. She has noticed anymore blood since stopping medication. I told her I would relay the recommendations that I provided for vaginal itching.

## 2021-12-31 ENCOUNTER — Ambulatory Visit
Admission: RE | Admit: 2021-12-31 | Discharge: 2021-12-31 | Disposition: A | Discharge status: Home / Self Care | Payer: Federal, State, Local not specified - PPO | Source: Ambulatory Visit | Attending: Family Medicine | Admitting: Family Medicine

## 2021-12-31 DIAGNOSIS — Z1231 Encounter for screening mammogram for malignant neoplasm of breast: Secondary | ICD-10-CM | POA: Diagnosis not present

## 2022-03-06 ENCOUNTER — Telehealth: Payer: Self-pay | Admitting: Family Medicine

## 2022-03-07 NOTE — Telephone Encounter (Signed)
Pt states the RX  amLODipine (NORVASC) 2.5 MG tablet  Was denied, can you please resend?

## 2022-03-08 MED ORDER — AMLODIPINE BESYLATE 2.5 MG PO TABS: ORAL_TABLET | ORAL | 3 refills | Status: DC

## 2022-03-08 NOTE — Telephone Encounter (Signed)
Rx resent.

## 2022-03-10 ENCOUNTER — Other Ambulatory Visit: Payer: Self-pay | Admitting: Family Medicine

## 2022-03-11 ENCOUNTER — Ambulatory Visit: Payer: Federal, State, Local not specified - PPO | Admitting: Family

## 2022-03-14 ENCOUNTER — Encounter: Payer: Self-pay | Admitting: Family Medicine

## 2022-03-14 ENCOUNTER — Ambulatory Visit: Payer: Federal, State, Local not specified - PPO | Admitting: Family Medicine

## 2022-03-14 VITALS — BP 144/78 | HR 71 | Temp 98.1°F | Ht 66.0 in | Wt 162.4 lb

## 2022-03-14 DIAGNOSIS — J40 Bronchitis, not specified as acute or chronic: Secondary | ICD-10-CM | POA: Diagnosis not present

## 2022-03-14 MED ORDER — HYDROCOD POLI-CHLORPHE POLI ER 10-8 MG/5ML PO SUER: 5.0000 mL | Freq: Two times a day (BID) | ORAL | 0 refills | Status: DC | PRN

## 2022-03-14 MED ORDER — BENZONATATE 100 MG PO CAPS: 100.0000 mg | ORAL_CAPSULE | Freq: Three times a day (TID) | ORAL | 0 refills | Status: DC | PRN

## 2022-03-14 MED ORDER — AZITHROMYCIN 250 MG PO TABS: ORAL_TABLET | ORAL | 0 refills | Status: AC

## 2022-03-14 NOTE — Progress Notes (Signed)
Subjective:     Patient ID: Dana Mcmahon, female    DOB: 1955-03-07, 67 y.o.   MRN: [id number]  Chief Complaint  Patient presents with   Cough    HPI Cough since 03/06/22-had fever on 1/14 and coughing, runny nose.  Was taking coricedin.  Voice hoarse.  Keeping awake.  Productive.  No sob.  Occ nausea.  Some nausea so not eating.  Did have some diarrhea as well. No imm for RSV.  No flu imm either.  Didn't test for covid.   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     BIOTIN PO Take 1 tablet by mouth daily.     Cholecalciferol (VITAMIN D3 PO) Take 1 capsule by mouth daily.     diphenhydrAMINE HCl, Sleep, 50 MG CAPS Take 50 mg by mouth at bedtime.      ELDERBERRY PO Take by mouth.     fluticasone (FLONASE) 50 MCG/ACT nasal spray Place 1 spray into both nostrils daily as needed.     glucose blood (ACCU-CHEK GUIDE) test strip Check blood sugar  three times daily 100 each 11   Lancets (ACCU-CHEK MULTICLIX) lancets Check blood sugar three times daily 100 each 11   losartan (COZAAR) 100 MG tablet Take 1 tablet (100 mg total) by mouth daily. 90 tablet 3   metFORMIN (GLUCOPHAGE-XR) 500 MG 24 hr tablet Take 1 tablet (500 mg total) by mouth 2 (two) times daily with a meal. 180 tablet 3   metoprolol succinate (TOPROL-XL) 25 MG 24 hr tablet Take 0.5 tablets (12.5 mg total) by mouth daily. 90 tablet 3   Multiple Vitamin (MULTIVITAMIN WITH MINERALS) TABS tablet Take 1 tablet by mouth daily.     rosuvastatin (CRESTOR) 5 MG tablet TAKE 1 TABLET(5 MG) BY MOUTH 1 TIME A WEEK 13 tablet 3   vitamin E 1000 UNIT capsule Take 1,000 Units by mouth daily.     No facility-administered medications prior to visit.    Allergies  Allergen Reactions   Codeine Nausea Only   Penicillins Nausea And Vomiting, Swelling and Other (See Comments)    Has patient had a PCN reaction causing immediate rash, facial/tongue/throat swelling, SOB or lightheadedness with hypotension: Yes Has patient had a PCN reaction  causing severe rash involving mucus membranes or skin necrosis: No Has patient had a PCN reaction that required hospitalization: No Has patient had a PCN reaction occurring within the last 10 years: No If all of the above answers are "NO", then may proceed with Cephalosporin use.    Latex Rash   ROS neg/noncontributory except as noted HPI/below  Bp's have been "normal" 147-152.      Objective:     BP (!) 144/78 (BP Location: Left Arm, Patient Position: Sitting, Cuff Size: Normal)   Pulse 71   Temp 98.1 F (36.7 C) (Temporal)   Ht '5\' 6"'$  (1.676 m)   Wt 162 lb 6.4 oz (73.7 kg)   LMP  (LMP Unknown)   SpO2 97%   BMI 26.21 kg/m  Wt Readings from Last 3 Encounters:  03/14/22 162 lb 6.4 oz (73.7 kg)  11/26/21 170 lb 3.2 oz (77.2 kg)  09/13/21 166 lb (75.3 kg)    Physical Exam   Gen: WDWN NAD HEENT: NCAT, conjunctiva not injected, sclera  nonicteric TM WNL B, OP moist, no exudates  NECK:  supple, no thyromegaly, no nodes, no carotid bruits CARDIAC: RRR, S1S2+, no murmur.  LUNGS: CTAB. No wheezes EXT:  no edema MSK: no gross abnormalities.  NEURO: A&O x3.  CN II-XII intact.  PSYCH: normal mood. Good eye contact     Assessment & Plan:   Problem List Items Addressed This Visit   None Visit Diagnoses     Bronchitis    -  Primary      Bronchitis(possiblity RSV).  Zpk, tessalon perles '100mg'$  tid prn, tussionex.  Pdmp checked.  Worse, no change, let us know HTN-declines med adjustments.  Meds ordered this encounter  Medications   azithromycin (ZITHROMAX) 250 MG tablet    Sig: Take 2 tablets on day 1, then 1 tablet daily on days 2 through 5    Dispense:  6 tablet    Refill:  0   benzonatate (TESSALON PERLES) 100 MG capsule    Sig: Take 1 capsule (100 mg total) by mouth 3 (three) times daily as needed.    Dispense:  20 capsule    Refill:  0   chlorpheniramine-HYDROcodone (TUSSIONEX) 10-8 MG/5ML    Sig: Take 5 mLs by mouth every 12 (twelve) hours as needed for cough.    Dispense:  60 mL    Refill:  0    Wellington Hampshire, MD

## 2022-03-14 NOTE — Patient Instructions (Signed)
It was very nice to see you today!  Meds sent to pharmacy.   PLEASE NOTE:  If you had any lab tests please let us know if you have not heard back within a few days. You may see your results on MyChart before we have a chance to review them but we will give you a call once they are reviewed by [place]. If we ordered any referrals today, please let us know if you have not heard from their office within the next week.   Please try these tips to maintain a healthy lifestyle:  Eat most of your calories during the day when you are active. Eliminate processed foods including packaged sweets (pies, cakes, cookies), reduce intake of potatoes, white bread, white pasta, and white rice. Look for whole grain options, oat flour or almond flour.  Each meal should contain half fruits/vegetables, one quarter protein, and one quarter carbs (no bigger than a computer mouse).  Cut down on sweet beverages. This includes juice, soda, and sweet tea. Also watch fruit intake, though this is a healthier sweet option, it still contains natural sugar! Limit to 3 servings daily.  Drink at least 1 glass of water with each meal and aim for at least 8 glasses per day  Exercise at least 150 minutes every week.

## 2022-04-01 ENCOUNTER — Ambulatory Visit: Payer: Federal, State, Local not specified - PPO | Admitting: Family Medicine

## 2022-04-01 ENCOUNTER — Encounter: Payer: Self-pay | Admitting: Family Medicine

## 2022-04-01 VITALS — BP 120/62 | HR 74 | Temp 98.0°F | Ht 66.0 in | Wt 162.0 lb

## 2022-04-01 DIAGNOSIS — J40 Bronchitis, not specified as acute or chronic: Secondary | ICD-10-CM | POA: Diagnosis not present

## 2022-04-01 DIAGNOSIS — E1169 Type 2 diabetes mellitus with other specified complication: Secondary | ICD-10-CM | POA: Diagnosis not present

## 2022-04-01 DIAGNOSIS — E119 Type 2 diabetes mellitus without complications: Secondary | ICD-10-CM

## 2022-04-01 DIAGNOSIS — I10 Essential (primary) hypertension: Secondary | ICD-10-CM

## 2022-04-01 DIAGNOSIS — E785 Hyperlipidemia, unspecified: Secondary | ICD-10-CM

## 2022-04-01 LAB — POCT GLYCOSYLATED HEMOGLOBIN (HGB A1C): Hemoglobin A1C: 7.2 % — AB (ref 4.0–5.6)

## 2022-04-01 NOTE — Progress Notes (Signed)
Phone [phone] In person visit   Subjective:   Dana Mcmahon is a 67 y.o. year old very pleasant female patient who presents for/with See problem oriented charting Chief Complaint  Patient presents with   Follow-up   Hyperlipidemia   Diabetes   Hypertension   Cough    Pt still has lingering cough.    Past Medical History-  Patient Active Problem List   Diagnosis Date Noted   Diabetes mellitus type II, controlled ([place]) 05/10/2013    Priority: High   Headache, unspecified headache type 06/03/2017    Priority: Medium    History of adenomatous polyp of colon 07/25/2016    Priority: Medium    Upper airway cough syndrome 01/22/2016    Priority: Medium    Hyperlipidemia associated with type 2 diabetes mellitus ([place])     Priority: Medium    Primary hypertension 05/10/2013    Priority: Medium    Palpitations 05/10/2013    Priority: Medium    COVID-19 vaccine series declined 03/16/2020    Priority: Low   Osteoarthritis, hand 12/16/2014    Priority: Low   Allergic rhinitis 07/24/2014    Priority: Low   Obesity, Class I, BMI 30-34.9     Priority: Low   Dizzy spells 08/08/2013    Priority: Low    Medications- reviewed and updated Current Outpatient Medications  Medication Sig Dispense Refill   amLODipine (NORVASC) 2.5 MG tablet TAKE 1 TABLET(2.5 MG) BY MOUTH DAILY 90 tablet 3   Ascorbic Acid (VITAMIN C PO) Take 1 tablet by mouth daily.     benzonatate (TESSALON PERLES) 100 MG capsule Take 1 capsule (100 mg total) by mouth 3 (three) times daily as needed. 20 capsule 0   BIOTIN PO Take 1 tablet by mouth daily.     chlorpheniramine-HYDROcodone (TUSSIONEX) 10-8 MG/5ML Take 5 mLs by mouth every 12 (twelve) hours as needed for cough. 60 mL 0   Cholecalciferol (VITAMIN D3 PO) Take 1 capsule by mouth daily.     diphenhydrAMINE HCl, Sleep, 50 MG CAPS Take 50 mg by mouth at bedtime.      ELDERBERRY PO Take by mouth.     fluticasone (FLONASE) 50 MCG/ACT nasal spray Place 1  spray into both nostrils daily as needed.     glucose blood (ACCU-CHEK GUIDE) test strip Check blood sugar three times daily 100 each 11   Lancets (ACCU-CHEK MULTICLIX) lancets Check blood sugar three times daily 100 each 11   losartan (COZAAR) 100 MG tablet Take 1 tablet (100 mg total) by mouth daily. 90 tablet 3   metFORMIN (GLUCOPHAGE-XR) 500 MG 24 hr tablet Take 1 tablet (500 mg total) by mouth 2 (two) times daily with a meal. 180 tablet 3   metoprolol succinate (TOPROL-XL) 25 MG 24 hr tablet Take 0.5 tablets (12.5 mg total) by mouth daily. 90 tablet 3   Multiple Vitamin (MULTIVITAMIN WITH MINERALS) TABS tablet Take 1 tablet by mouth daily.     rosuvastatin (CRESTOR) 5 MG tablet TAKE 1 TABLET(5 MG) BY MOUTH 1 TIME A WEEK 13 tablet 3   vitamin E 1000 UNIT capsule Take 1,000 Units by mouth daily.     No current facility-administered medications for this visit.     Objective:  BP 120/62   Pulse 74   Temp 98 F (36.7 C)   Ht 5' 6"$  (1.676 m)   Wt 162 lb (73.5 kg)   LMP  (LMP Unknown)   SpO2 96%   BMI 26.15 kg/m  Gen: NAD, resting comfortably Oropharynx normal CV: RRR no murmurs rubs or gallops Lungs: CTAB no crackles, wheeze, rhonchi Abdomen: soft/nontender/nondistended/normal bowel sounds. No rebound or guarding.  Ext: trace edema Skin: warm, dry    Assessment and Plan   #Social update- had great trip to [place] - a lot of adventures!  -short term refreshment -may take it easier next   # Cough S: Patient was seen for bronchitis by Dr. Cherlynn Kaiser in late January and treated with azithromycin for potential bacterial bronchitis note that this could be viral  -she is still a little hoarse and has some intermittent cough- did not take the hydrocodone- felt too strong. Azithromycin didn't help much -she is still dealing with fatigue as well - she was out 2 weeks and was able to go back despite still coughing then got nauseous again Monday through Wednesday like at beginning of  illness- thankfully that improved as of yesterday and able to go back to work yesterday A/P: Bronchitis- we discussed can last 3-6 weeks with lingering cough and lack of improvement on antibiotic. I would say if doesn't resolve in next 2-3 weeks or so consider calling or messaging to get chest x-ray -nausea could be triggered by metformin  # Diabetes S: compliant with metformin 558m twice daily XR- had been once daily . Was having diarrhea on higher doses IR. -has worked on diet and got weight down 8 lbs. Not exercising with illness - Possibly could add januvia if needed if gets high and cannot control with diet/exercise.  Lab Results  Component Value Date   HGBA1C 7.2 (A) 04/01/2022   HGBA1C 8.1 (H) 11/26/2021   HGBA1C 7.7 (H) 06/11/2021  A/P:a1c 7.2 on poc today- could be higher on phlebotomy but improving. Has made progress with higher metformin and  weight loss- wants to work on this over adding more medicine.  -wants to hold off on diabetes education  Hyperlipidemia  S: Compliant with rosuvastatin 5 mg once a week.   Lab Results  Component Value Date   CHOL 212 (H) 11/26/2021   HDL 48.30 11/26/2021   LDLDIRECT 106.0 11/26/2021   TRIG 392.0 (H) 11/26/2021   CHOLHDL 4 11/26/2021  A/P: lipids above goal - has missed doses in last month with illness- encouraged her to restart when feeling better  #Hypertension S:compliant with amlodipine 2.5 mg once a day, losartan 100 mg, and metoprolol 260mextended release.   BP Readings from Last 3 Encounters:  04/01/22 120/62  03/14/22 (!) 144/78  11/26/21 138/78  A/P:  stable- continue current medicines  -she would like to reduce losartan if blood pressure stays this good   #Headache, unspecified headache type S: still getting some stress headaches mainly on the left side. advil or tylenol helps.  Had negative temporal artery biopsy a few years ago.  No vision loss reported - at least once a week still getting these A/P: ongoing  issue-still wants to hold off on neurology or neuroimaging   #colonoscopy was spaced to 7 years per Dr. NaSilverio DecampRecommended follow up: Return in about 14 weeks (around 07/08/2022) for followup or sooner if needed.Schedule b4 you leave.  Lab/Order associations:   ICD-10-CM   1. Bronchitis  J40     2. Controlled type 2 diabetes mellitus without complication, without long-term current use of insulin (HCC)  E11.9 POCT HgB A1C    3. Hyperlipidemia associated with type 2 diabetes mellitus ([place] E11.69    E78.5     4. Primary hypertension  I10      Return precautions advised.  Garret Reddish, MD

## 2022-04-01 NOTE — Patient Instructions (Addendum)
Sign release of information at the check out desk for diabetic eye exam.  Bronchitis- we discussed can last 3-6 weeks with lingering cough and lack of improvement on antibiotic. I would say if doesn't resolve in next 2-3 weeks or so consier calling or messaging to get chest x-ray  [facility] contact Please call to schedule visit and/or procedure Address: [address], [place], [place] [address] Phone: [phone]   Recommended follow up: Return in about 14 weeks (around 07/08/2022) for followup or sooner if needed.Schedule b4 you leave.

## 2022-04-05 ENCOUNTER — Telehealth: Payer: Self-pay

## 2022-04-05 NOTE — Patient Outreach (Signed)
  Care Coordination   04/05/2022 Name: Dana Mcmahon MRN: [id number] DOB: 08-23-1955   Care Coordination Outreach Attempts:  An unsuccessful telephone outreach was attempted today to offer the patient information about available care coordination services as a benefit of their health plan.   Follow Up Plan:  Additional outreach attempts will be made to offer the patient care coordination information and services.   Encounter Outcome:  Pt. Request to Call Back   Care Coordination Interventions:  No, not indicated    Jone Baseman, RN, MSN [facility] Care Management Coordinator Direct Line [phone]

## 2022-05-09 ENCOUNTER — Telehealth: Payer: Self-pay | Admitting: Family Medicine

## 2022-05-09 NOTE — Telephone Encounter (Signed)
Please schedule ov for to be evaluated.

## 2022-05-09 NOTE — Telephone Encounter (Signed)
Patient states: - She passed out yesterday at a restaurant  - She is not sure how long she was passed out or if her head was hit  I offered pt to speak to triage nurse but she declined. I tried to get more information but she declined. Please advise.

## 2022-05-13 ENCOUNTER — Ambulatory Visit: Payer: Federal, State, Local not specified - PPO | Admitting: Family Medicine

## 2022-05-13 ENCOUNTER — Encounter: Payer: Self-pay | Admitting: Family Medicine

## 2022-05-13 VITALS — BP 142/82 | HR 78 | Temp 97.8°F | Ht 66.0 in | Wt 162.2 lb

## 2022-05-13 DIAGNOSIS — E119 Type 2 diabetes mellitus without complications: Secondary | ICD-10-CM

## 2022-05-13 DIAGNOSIS — I10 Essential (primary) hypertension: Secondary | ICD-10-CM

## 2022-05-13 DIAGNOSIS — R55 Syncope and collapse: Secondary | ICD-10-CM | POA: Diagnosis not present

## 2022-05-13 DIAGNOSIS — R519 Headache, unspecified: Secondary | ICD-10-CM

## 2022-05-13 DIAGNOSIS — E1169 Type 2 diabetes mellitus with other specified complication: Secondary | ICD-10-CM | POA: Diagnosis not present

## 2022-05-13 DIAGNOSIS — E785 Hyperlipidemia, unspecified: Secondary | ICD-10-CM

## 2022-05-13 NOTE — Progress Notes (Signed)
Phone [phone] In person visit   Subjective:   Dana Mcmahon is a 67 y.o. year old very pleasant female patient who presents for/with See problem oriented charting Chief Complaint  Patient presents with   Follow-up    Pt is her to f/u on syncope episode on Sunday as she was talking to church member and felt a wave of heat from the inside and took 5 steps and passed out not sure if she hit her head.   Past Medical History-  Patient Active Problem List   Diagnosis Date Noted   Diabetes mellitus type II, controlled ([place]) 05/10/2013    Priority: High   Headache, unspecified headache type 06/03/2017    Priority: Medium    History of adenomatous polyp of colon 07/25/2016    Priority: Medium    Upper airway cough syndrome 01/22/2016    Priority: Medium    Hyperlipidemia associated with type 2 diabetes mellitus ([place])     Priority: Medium    Primary hypertension 05/10/2013    Priority: Medium    Palpitations 05/10/2013    Priority: Medium    COVID-19 vaccine series declined 03/16/2020    Priority: Low   Osteoarthritis, hand 12/16/2014    Priority: Low   Allergic rhinitis 07/24/2014    Priority: Low   Obesity, Class I, BMI 30-34.9     Priority: Low   Dizzy spells 08/08/2013    Priority: Low    Medications- reviewed and updated Current Outpatient Medications  Medication Sig Dispense Refill   amLODipine (NORVASC) 2.5 MG tablet TAKE 1 TABLET(2.5 MG) BY MOUTH DAILY 90 tablet 3   Ascorbic Acid (VITAMIN C PO) Take 1 tablet by mouth daily.     BIOTIN PO Take 1 tablet by mouth daily.     Cholecalciferol (VITAMIN D3 PO) Take 1 capsule by mouth daily.     diphenhydrAMINE HCl, Sleep, 50 MG CAPS Take 50 mg by mouth at bedtime.      ELDERBERRY PO Take by mouth.     fluticasone (FLONASE) 50 MCG/ACT nasal spray Place 1 spray into both nostrils daily as needed.     glucose blood (ACCU-CHEK GUIDE) test strip Check blood sugar three times daily 100 each 11   Lancets (ACCU-CHEK  MULTICLIX) lancets Check blood sugar three times daily 100 each 11   losartan (COZAAR) 100 MG tablet Take 1 tablet (100 mg total) by mouth daily. 90 tablet 3   metFORMIN (GLUCOPHAGE-XR) 500 MG 24 hr tablet Take 1 tablet (500 mg total) by mouth 2 (two) times daily with a meal. 180 tablet 3   metoprolol succinate (TOPROL-XL) 25 MG 24 hr tablet Take 0.5 tablets (12.5 mg total) by mouth daily. 90 tablet 3   Multiple Vitamin (MULTIVITAMIN WITH MINERALS) TABS tablet Take 1 tablet by mouth daily.     rosuvastatin (CRESTOR) 5 MG tablet TAKE 1 TABLET(5 MG) BY MOUTH 1 TIME A WEEK 13 tablet 3   vitamin E 1000 UNIT capsule Take 1,000 Units by mouth daily.     No current facility-administered medications for this visit.     Objective:  BP (!) 142/82   Pulse 78   Temp 97.8 F (36.6 C)   Ht 5\' 6"  (1.676 m)   Wt 162 lb 3.2 oz (73.6 kg)   LMP  (LMP Unknown)   SpO2 95%   BMI 26.18 kg/m  Gen: NAD, resting comfortably CV: RRR no murmurs rubs or gallops Lungs: CTAB no crackles, wheeze, rhonchi Abdomen: soft/nontender/nondistended/normal bowel sounds.  Ext: no edema Skin: warm, dry Neuro: CN II-XII intact, sensation and reflexes normal throughout, 5/5 muscle strength in bilateral upper and lower extremities. Normal finger to nose. Normal rapid alternating movements. No pronator drift. Normal romberg. Normal gait.   EKG: Sinus rhythm with rate 68, normal axis, normal intervals, no hypertrophy, no st or t wave changes     Assessment and Plan    # Syncope S: Patient was in her normal state of health when she went to church on Sunday.  Her husband was speaking that day.  They went out to lunch with the pastor afterwards.  Before she had eaten anything she experienced almost a tunneling of her vision as well as an internal sensation of heat.  She got up to walk out of the restaurant with husbands support as she felt poorly and the next thing she knew she was found on the floor.  Her husband was with her  and states she did not hit her head. No chest pain or shortness of breath with episode. Apparently her father had a similar episode while he was alive- she was not aware prior to sister telling her. No incontinence. No tongue biting.  -after the event felt very weak and had help from some bikers that helped her get to the car -did not check sugar but felt better after nap and had not eaten food -did not check blood pressure   This week she felt run down/more tired and had more pressure in top of her head similar to prior headaches. Also has had some sharp pains in top of her head over last several months.   A/P: Unexplained syncope-symptoms seem almost vasovagal but there is no clear trigger - EKG largely reassuring - Neurological examination also reassuring - Since she has had no recurrence we did not opt for emergency room visit - Wonder about blood pressure and blood sugar at time of incident but we do not have this data-encouraged her if recurrent to seek care immediately -She has an established relationship with cardiology but she requested a new referral be placed - MRI of the brain has been ordered-I have recommended this for some time with ongoing headaches and have also recommended neurology consult-both for syncope and for ongoing headaches-she is in agreement with this.  She had temporal artery biopsy for headache on the left side of her head-is on FMLA related to headaches and I have told her multiple times in the past would really like to have neurology consult - Unfortunately our lab was closed by the time of our visit-she will come back for updated labs   # Diabetes S: compliant with metformin 500mg  twice daily XR . Was having diarrhea on higher doses IR.  She has been resistant to adding additional medication and wanted to focus on lifestyle A/P: Hopefuly stable or improved- update A1c with labs. Continue current meds for now.  Hyperlipidemia  S: Compliant with  rosuvastatin 5 mg  once a week.   Lab Results  Component Value Date   CHOL 212 (H) 11/26/2021   HDL 48.30 11/26/2021   LDLDIRECT 106.0 11/26/2021   TRIG 392.0 (H) 11/26/2021   CHOLHDL 4 11/26/2021   A/P: LDL has been above goal but this has been the max dose she has been willing to take-had recommended change to daily which she declined  Hypertension S:compliant with amlodipine 2.5 mg once a day, losartan 100 mg, and metoprolol 25mg  extended release.    Stopped HCTZ due to potentially  causing gout.  Stopped ACE inhibitor in the past due to cough but later restarted ARB.  BP Readings from Last 3 Encounters:  05/13/22 (!) 142/82  04/01/22 120/62  03/14/22 (!) 144/78   A/P: Blood pressure above goal today-hesitant to increase blood pressure medications until we have a better understanding of what is causing her symptoms-no obvious block on EKG and would not change metoprolol at this time unless information discovered in the future that would change his decision.  Blood pressure has been well-controlled at home in the past  In regards to lab interpretation she is aware of amount of the office next week and may not see her results until the following week  Recommended follow up: Return for as needed for new, worsening, persistent symptoms. Future Appointments  Date Time Provider [place]  05/16/2022 11:30 AM [facility]  07/08/2022  1:20 PM Marin Olp, MD [facility]    Lab/Order associations:   ICD-10-CM   1. Syncope, unspecified syncope type  R55 EKG 12-Lead    Ambulatory referral to Neurology    Ambulatory referral to Cardiology    CANCELED: Ambulatory referral to Cardiology    2. New onset of headaches after age 49  R51.9 MR Brain Wo Contrast    Ambulatory referral to Neurology    3. Controlled type 2 diabetes mellitus without complication, without long-term current use of insulin (HCC)  E11.9 CBC with Differential/Platelet    Comprehensive metabolic panel     Hemoglobin A1c    4. Hyperlipidemia associated with type 2 diabetes mellitus ([place])  E11.69    E78.5     5. Primary hypertension  I10       Return precautions advised.  Garret Reddish, MD

## 2022-05-13 NOTE — Patient Instructions (Addendum)
Unfortunately when we have a case of unexplained passing out- no driving for 6 months unless cleared by cardiology and neurology likely in this case.   Come back next week for labs  We will either call you or see alternate below within two weeks about your referral to cardiology and neurology . Our referral specialist will sometimes also send you a mychart link once referral is approved and then you will call the # listed on there (let us know if you do not see this within 2 weeks or have not received call)  We will call you within two weeks about your referral to MRI brain through [facility].  Their phone number is [phone].  Please call them if you have not heard in 1-2 weeks  If recurrent seek care immediately/call 911  Recommended follow up: Return for as needed for new, worsening, persistent symptoms. Also keep may visit

## 2022-05-16 ENCOUNTER — Telehealth: Payer: Self-pay | Admitting: Cardiovascular Disease

## 2022-05-16 ENCOUNTER — Other Ambulatory Visit: Payer: Federal, State, Local not specified - PPO

## 2022-05-16 NOTE — Telephone Encounter (Signed)
Pt's waitlist appt rescheduled for 4/2 @2 :30. Left message on husbands voicemail (per DPR) to call us if this date and time doesn't work for her.

## 2022-05-16 NOTE — Telephone Encounter (Signed)
Pt c/o Syncope: STAT if syncope occurred within 30 minutes and pt complains of lightheadedness High Priority if episode of passing out, completely, today or in last 24 hours   Did you pass out today? Not today  When is the last time you passed out? Last time was 3/17   Has this occurred multiple times? Pt has a history of this   Did you have any symptoms prior to passing out? Pt states she is fine other than an ongoing headache.   Pt has a referral in for Dr. Acie Fredrickson per Dr. Yong Channel. Scheduled for soonest, May 30th and also added as a high priority on the waitlist for Dr. Acie Fredrickson. PCP Dr. Yong Channel wants her to be seen soon regarding this. Pt states he has also ordered an MRI.

## 2022-05-19 ENCOUNTER — Other Ambulatory Visit: Payer: Federal, State, Local not specified - PPO

## 2022-05-23 ENCOUNTER — Encounter: Payer: Self-pay | Admitting: Neurology

## 2022-05-23 ENCOUNTER — Encounter: Payer: Self-pay | Admitting: Cardiovascular Disease

## 2022-05-23 NOTE — Progress Notes (Unsigned)
Cardiology Office Note:    Date:  05/24/2022   ID:  Dana Mcmahon, DOB April 23, 1955, MRN [id number]  PCP:  Marin Olp, MD  Cardiologist:  Angelissa Supan   Problem List 1. Palpitations  2.  Diabetes mellitus 3.  Hyperlipidemia   Referring MD: Marin Olp, MD   Chief Complaint  Patient presents with   Hypertension   Palpitations    Prior notes:     Dana Mcmahon is a 67 y.o. female with a hx of palpitations. Feels her Heart racing Associated with chest heaviness Last for several minutes, Different times of the day .   Has woken her up from sleep on occasion Exercises reguarly - walks on the treadmill - ,  No issues while working out, she occasionally symptoms right after working out.  These are associated with some mild lightheadedness.  Similar palpitations in 2016 - Holter showed PACs and PVCs   August 07, 2017:  Dana Mcmahon is seen today for follow up of her PVCs Tried toprol XL.  Has lots of fatigue.   No energy to do much  Not exercisig     March 08, 2019:  Dana Mcmahon is seen today for follow-up of her prior palpitations.  She is had a Holter monitor that showed premature atrial contractions and premature ventricular contractions.  I saw her a year ago.  At that time we had her on low-dose Toprol-XL which caused some fatigue.  June 05, 2020: Dana Mcmahon is seen today for follow up of her palpitatios /PVCs.and PACs BP is slightly elevated.  Has mild leg edema - is on amlodipine  Still eats salt on occasion  Has tried HCTZ but she developed gout in 2015  Encourage more cardio exercise   September 13, 2021 Dana Mcmahon is seen for follow up of her hypertension and palpitations. Was on a mission trip this past weekend  - got in around midnight.  Very little sleep over the weekend  Is fatigued . As a result, her BP is elevated.   She has been trying to eat healthy.  May 23, 2022 Dana Mcmahon is seen for follow up of her HTN, palpitations On May 08, 2022 she passed out while  at [employer]  On that day, she had no breakfast ,  had a little to drink  Then church, then went to the restaurant  The episode occurred around 1:30 -2:00 PM  No episodes since then  No CP , no dyspnea  Her echocardiogram from April, 2017 revealed hyperdynamic left ventricular systolic function with an EF of 65 to 70%.  She had grade 1 diastolic dysfunction.  No significant aortic or mitral valve disease.  Has not been walking recently ,   Also has pulsitile tennitus  Will get a carotid duplex for further evaluation   She thinks her glucose may have been low     Past Medical History:  Diagnosis Date   Allergic rhinitis    Anxiety    Arthritis    DM type 2 (diabetes mellitus, type 2)    Heart murmur    Hyperlipidemia    per old records, pt has refused cholesto-lowering meds   Hypertension    Neck pain, chronic    Mild DDD, no signif progression (multiple MRI's: 2001, 2003, 2005, 2007)   Obesity, Class I, BMI 30-34.9    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   Right    Past Surgical History:  Procedure Laterality Date   ARTERY BIOPSY Left 06/13/2017  Procedure: LEFT TEMPORAL ARTERY BIOPSY;  Surgeon: Johnathan Hausen, MD;  Location: [facility];  Service: General;  Laterality: Left;   COLONOSCOPY     none      Current Medications: Current Meds  Medication Sig   amLODipine (NORVASC) 2.5 MG tablet TAKE 1 TABLET(2.5 MG) BY MOUTH DAILY   Ascorbic Acid (VITAMIN C PO) Take 1 tablet by mouth daily.   BIOTIN PO Take 1 tablet by mouth daily.   Cholecalciferol (VITAMIN D3 PO) Take 1 capsule by mouth daily.   doxylamine, Sleep, (UNISOM) 25 MG tablet Take 25 mg by mouth at bedtime as needed for sleep.   ELDERBERRY PO Take by mouth.   fluticasone (FLONASE) 50 MCG/ACT nasal spray Place 1 spray into both nostrils daily as needed.   glucose blood (ACCU-CHEK GUIDE) test strip Check blood sugar three times daily   Lancets (ACCU-CHEK MULTICLIX) lancets Check blood sugar three times daily    losartan (COZAAR) 100 MG tablet Take 1 tablet (100 mg total) by mouth daily.   metFORMIN (GLUCOPHAGE-XR) 500 MG 24 hr tablet Take 1 tablet (500 mg total) by mouth 2 (two) times daily with a meal.   metoprolol succinate (TOPROL-XL) 25 MG 24 hr tablet Take 0.5 tablets (12.5 mg total) by mouth daily.   Multiple Vitamin (MULTIVITAMIN WITH MINERALS) TABS tablet Take 1 tablet by mouth daily.   rosuvastatin (CRESTOR) 5 MG tablet TAKE 1 TABLET(5 MG) BY MOUTH 1 TIME A WEEK   vitamin E 1000 UNIT capsule Take 1,000 Units by mouth daily.     Allergies:   Codeine, Penicillins, and Latex   Social History   Socioeconomic History   Marital status: Married    Spouse name: Not on file   Number of children: Not on file   Years of education: Not on file   Highest education level: Not on file  Occupational History   Not on file  Tobacco Use   Smoking status: Never   Smokeless tobacco: Never  Vaping Use   Vaping Use: Never used  Substance and Sexual Activity   Alcohol use: No   Drug use: No   Sexual activity: Not Currently    Partners: Male  Other Topics Concern   Not on file  Social History Narrative   Married, 4 children (daughter Dana Mcmahon patient here, husband Dana Mcmahon [place], son [place]). 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      Hobbies: time with family, watching news, reading            Social Determinants of Health   Financial Resource Strain: Not on file  Food Insecurity: Not on file  Transportation Needs: Not on file  Physical Activity: Not on file  Stress: Not on file  Social Connections: Not on file     Family History: The patient's family history includes Hypertension in her mother; Stroke in her brother and mother. There is no history of Colon cancer, Esophageal cancer, Rectal cancer, or Stomach cancer.  ROS:   Please see the history of present illness.     All other systems reviewed and are  negative.  EKGs/Labs/Other Studies Reviewed:    The following studies were reviewed today:    Recent Labs: 11/26/2021: ALT 19; BUN 14; Creatinine, Ser 0.76; Hemoglobin 13.7; Platelets 260.0; Potassium 4.3; Sodium 142  Recent Lipid Panel    Component Value Date/Time   CHOL 212 (H) 11/26/2021  1013   TRIG 392.0 (H) 11/26/2021 1013   HDL 48.30 11/26/2021 1013   CHOLHDL 4 11/26/2021 1013   VLDL 78.4 (H) 11/26/2021 1013   LDLDIRECT 106.0 11/26/2021 1013    Physical Exam:     Physical Exam: Blood pressure 138/72, pulse 73, height 5\' 6"  (1.676 m), weight 158 lb 6.4 oz (71.8 kg), SpO2 97 %.       GEN:  Well nourished, well developed in no acute distress HEENT: Normal NECK: No JVD; No carotid bruits LYMPHATICS: No lymphadenopathy CARDIAC: RRR , soft systolic murmur  RESPIRATORY:  Clear to auscultation without rales, wheezing or rhonchi  ABDOMEN: Soft, non-tender, non-distended MUSCULOSKELETAL:  No edema; No deformity  SKIN: Warm and dry NEUROLOGIC:  Alert and oriented x 3     EKG:    ASSESSMENT:    No diagnosis found.   PLAN:       1.  Palpitations -  stable   2.  Syncope: Cobi had an episode of syncope while waiting for her food at a restaurant: The episode occurred on Sunday.  She woke up and did not have any breakfast.  She had very little to drink.  She went to church and then later went to a restaurant.  While waiting for her food around 1 30-2 o'clock she was sitting down and suddenly had an episode of tunnel vision and syncope.  She woke up very quickly.   I suspect that her episode was due to hypovolemia.  I encouraged her to eat and drink regularly for breakfast.  Will place an event monitor on her for 14 days.  Will get an echocardiogram for further evaluation of her soft cardiac murmur and her episode of syncope.   3.  Pulsatile tinnitus: I did not hear any carotid bruits.  Will get a carotid duplex scan for further evaluation.    3.  HTN:  BP is  well controlled.          Medication Adjustments/Labs and Tests Ordered: Current medicines are reviewed at length with the patient today.  Concerns regarding medicines are outlined above.  No orders of the defined types were placed in this encounter.  No orders of the defined types were placed in this encounter.  Will have her see Dr. Johney Frame or Margaretann Loveless upon my discharge   Signed, Mertie Moores, MD  05/24/2022 3:03 PM    [facility]

## 2022-05-24 ENCOUNTER — Ambulatory Visit: Payer: Federal, State, Local not specified - PPO | Attending: Cardiovascular Disease | Admitting: Cardiovascular Disease

## 2022-05-24 ENCOUNTER — Ambulatory Visit: Payer: Federal, State, Local not specified - PPO | Attending: Cardiovascular Disease

## 2022-05-24 ENCOUNTER — Other Ambulatory Visit (INDEPENDENT_AMBULATORY_CARE_PROVIDER_SITE_OTHER): Payer: Federal, State, Local not specified - PPO

## 2022-05-24 ENCOUNTER — Encounter: Payer: Self-pay | Admitting: Cardiovascular Disease

## 2022-05-24 VITALS — BP 138/72 | HR 73 | Ht 66.0 in | Wt 158.4 lb

## 2022-05-24 DIAGNOSIS — R55 Syncope and collapse: Secondary | ICD-10-CM

## 2022-05-24 DIAGNOSIS — H93A9 Pulsatile tinnitus, unspecified ear: Secondary | ICD-10-CM

## 2022-05-24 DIAGNOSIS — E119 Type 2 diabetes mellitus without complications: Secondary | ICD-10-CM | POA: Diagnosis not present

## 2022-05-24 NOTE — Progress Notes (Unsigned)
Enrolled for [employer] to mail a ZIO XT long term holter monitor to the patients address on file.  

## 2022-05-24 NOTE — Addendum Note (Signed)
Addended by: Loura Back on: 05/24/2022 03:48 PM   Modules accepted: Orders

## 2022-05-24 NOTE — Addendum Note (Signed)
Addended by: Loura Back on: 05/24/2022 03:49 PM   Modules accepted: Orders

## 2022-05-24 NOTE — Patient Instructions (Signed)
Medication Instructions:  Your physician recommends that you continue on your current medications as directed. Please refer to the Current Medication list given to you today.  *If you need a refill on your cardiac medications before your next appointment, please call your pharmacy*  Testing/Procedures: Your physician has requested that you have an echocardiogram. Echocardiography is a painless test that uses sound waves to create images of your heart. It provides your doctor with information about the size and shape of your heart and how well your heart's chambers and valves are working. This procedure takes approximately one hour. There are no restrictions for this procedure. Please do NOT wear cologne, perfume, aftershave, or lotions (deodorant is allowed). Please arrive 15 minutes prior to your appointment time.  Your physician has requested that you have a carotid duplex. This test is an ultrasound of the carotid arteries in your neck. It looks at blood flow through these arteries that supply the brain with blood. Allow one hour for this exam. There are no restrictions or special instructions.  Your physician has recommended that you wear an event monitor. Event monitors are medical devices that record the heart's electrical activity. Doctors most often [place] these monitors to diagnose arrhythmias. Arrhythmias are problems with the speed or rhythm of the heartbeat. The monitor is a small, portable device. You can wear one while you do your normal daily activities. This is usually used to diagnose what is causing palpitations/syncope (passing out).  Follow-Up: At [facility] you and your health needs are our priority.  As part of our continuing mission to provide you with exceptional heart care, we have created designated Provider Care Teams.  These Care Teams include your primary Cardiologist (physician) and Advanced Practice Providers (APPs -  Physician Assistants and Nurse Practitioners)  who all work together to provide you with the care you need, when you need it.  Your next appointment:   2-3 month(s)  Provider:   Mertie Moores, MD

## 2022-05-25 LAB — COMPREHENSIVE METABOLIC PANEL
ALT: 14 U/L (ref 0–35)
AST: 14 U/L (ref 0–37)
Albumin: 4.5 g/dL (ref 3.5–5.2)
Alkaline Phosphatase: 74 U/L (ref 39–117)
BUN: 15 mg/dL (ref 6–23)
CO2: 27 mEq/L (ref 19–32)
Calcium: 9.7 mg/dL (ref 8.4–10.5)
Chloride: 103 mEq/L (ref 96–112)
Creatinine, Ser: 0.85 mg/dL (ref 0.40–1.20)
GFR: 70.92 mL/min (ref >60.00)
Glucose, Bld: 87 mg/dL (ref 70–99)
Potassium: 4.2 mEq/L (ref 3.5–5.1)
Sodium: 138 mEq/L (ref 135–145)
Total Bilirubin: 0.4 mg/dL (ref 0.2–1.2)
Total Protein: 7.5 g/dL (ref 6.0–8.3)

## 2022-05-25 LAB — CBC WITH DIFFERENTIAL/PLATELET
Basophils Absolute: 0.1 10*3/uL (ref 0.0–0.1)
Basophils Relative: 1 % (ref 0.0–3.0)
Eosinophils Absolute: 0.2 10*3/uL (ref 0.0–0.7)
Eosinophils Relative: 3.1 % (ref 0.0–5.0)
HCT: 41.2 % (ref 36.0–46.0)
Hemoglobin: 13.8 g/dL (ref 12.0–15.0)
Lymphocytes Relative: 37 % (ref 12.0–46.0)
Lymphs Abs: 2 10*3/uL (ref 0.7–4.0)
MCHC: 33.6 g/dL (ref 30.0–36.0)
MCV: 86.4 fl (ref 78.0–100.0)
Monocytes Absolute: 0.4 10*3/uL (ref 0.1–1.0)
Monocytes Relative: 7 % (ref 3.0–12.0)
Neutro Abs: 2.8 10*3/uL (ref 1.4–7.7)
Neutrophils Relative %: 51.9 % (ref 43.0–77.0)
Platelets: 311 10*3/uL (ref 150.0–400.0)
RBC: 4.77 Mil/uL (ref 3.87–5.11)
RDW: 13.8 % (ref 11.5–15.5)
WBC: 5.4 10*3/uL (ref 4.0–10.5)

## 2022-05-25 LAB — HEMOGLOBIN A1C: Hgb A1c MFr Bld: 7.3 % — ABNORMAL HIGH (ref 4.6–6.5)

## 2022-06-01 DIAGNOSIS — R55 Syncope and collapse: Secondary | ICD-10-CM

## 2022-06-01 DIAGNOSIS — H93A9 Pulsatile tinnitus, unspecified ear: Secondary | ICD-10-CM | POA: Diagnosis not present

## 2022-06-15 ENCOUNTER — Ambulatory Visit
Admission: RE | Admit: 2022-06-15 | Discharge: 2022-06-15 | Disposition: A | Discharge status: Home / Self Care | Payer: Federal, State, Local not specified - PPO | Source: Ambulatory Visit | Attending: Family Medicine | Admitting: Family Medicine

## 2022-06-15 DIAGNOSIS — R519 Headache, unspecified: Secondary | ICD-10-CM | POA: Diagnosis not present

## 2022-06-15 DIAGNOSIS — R55 Syncope and collapse: Secondary | ICD-10-CM | POA: Diagnosis not present

## 2022-06-17 ENCOUNTER — Ambulatory Visit (HOSPITAL_COMMUNITY): Payer: Federal, State, Local not specified - PPO

## 2022-06-17 ENCOUNTER — Encounter (HOSPITAL_COMMUNITY): Payer: Federal, State, Local not specified - PPO

## 2022-06-24 ENCOUNTER — Encounter: Payer: Self-pay | Admitting: Neurology

## 2022-06-24 ENCOUNTER — Ambulatory Visit: Payer: Federal, State, Local not specified - PPO | Admitting: Neurology

## 2022-06-24 VITALS — BP 138/65 | HR 67 | Ht 63.5 in | Wt 163.5 lb

## 2022-06-24 DIAGNOSIS — R400 Somnolence: Secondary | ICD-10-CM

## 2022-06-24 DIAGNOSIS — G44219 Episodic tension-type headache, not intractable: Secondary | ICD-10-CM

## 2022-06-24 DIAGNOSIS — R55 Syncope and collapse: Secondary | ICD-10-CM

## 2022-06-24 NOTE — Patient Instructions (Signed)
Good to meet you!  Schedule home sleep study  2. Keep a calendar of your headaches. Let me know if you want to start headache medication  3. Follow-up in 6 months, call for any changes

## 2022-06-24 NOTE — Progress Notes (Signed)
NEUROLOGY CONSULTATION NOTE  Dana Mcmahon MRN: [id number] DOB: 20-Feb-1956  Referring provider: Dr. Tana Conch Primary care provider: Dr. Tana Conch  Reason for consult:  syncope, headaches  Dear Dr Durene Cal:  Thank you for your kind referral of Dana Mcmahon for consultation of the above symptoms. Although her history is well known to you, please allow me to reiterate it for the purpose of our medical record. She is alone in the office today. Records and images were personally reviewed where available.   HISTORY OF PRESENT ILLNESS: This is a pleasant 67 year old right-handed woman with a history of hypertension, palpitations, hyperlipidemia, DM, presenting for evaluation of syncope and headaches. On 05/08/22, she passed out at a restaurant. She had not been sleeping well and had not eaten breakfast or drank much that day. She went to church, then to a restaurant where while sitting down she started having tunnel vision with things around her looking bright around them. She alerted her husband she was not feeling well and took 5 steps then her whole body felt weak. She does not recall feeling dizzy, she slid down on the wall beside her with loss of consciousness for 2-3 minutes. She felt very weak diffusely after, no tongue bite or incontinence. She felt she could not open her eyes but was very alert when she came to, saying no to EMS. She recalls being sweaty. She denies any prior history of syncope. No staring/unresponsive episodes, gaps in time, olfactory/gustatory hallucinations, deja vu, rising epigastric sensation, focal numbness/tingling/weakness, myoclonic jerks. She sees Cardiology and had an unremarkable echocardiogram, Zio monitor no significant arrhythmias with rare very brief episodes of SVT, episode felt due to hypovolemia. Her son had seizures. She had a normal birth and early development.  There is no history of febrile convulsions, CNS infections such  as meningitis/encephalitis, significant traumatic brain injury, neurosurgical procedures.  For the past 4 years, she has had sharp headaches over the midline from front to back lasting a few seconds. They occur 4-5 times a month, she had one yesterday. Sometimes pain is on the left temporal region. She was doing a lot of work yesterday and had a headache at the end of the day. No associated nausea/vomiting, photo/phonophobia, visual obscurations. She does not take any headache medication. She can continue to function but it slows her down. She has occasional neck pain. No diplopia, dysarthria/dysphagia, bowel/bladder dysfunction. She reports difficulty sleeping with 4 hours of sleep. She feels drowsy in the daytime and has been told she snores. She has a very stressful job, she has been working as Education administrator since 2007.   I personally reviewed brain MRI without contrast done 05/2022, no acute changes, there was mild to moderate chronic microvascular disease.   PAST MEDICAL HISTORY: Past Medical History:  Diagnosis Date   Allergic rhinitis    Anxiety    Arthritis    DM type 2 (diabetes mellitus, type 2) (HCC)    Heart murmur    Hyperlipidemia    per old records, pt has refused cholesto-lowering meds   Hypertension    Neck pain, chronic    Mild DDD, no signif progression (multiple MRI's: 2001, 2003, 2005, 2007)   Obesity, Class I, BMI 30-34.9    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   Right    PAST SURGICAL HISTORY: Past Surgical History:  Procedure Laterality Date   ARTERY BIOPSY Left 06/13/2017   Procedure: LEFT TEMPORAL ARTERY BIOPSY;  Surgeon: Daphine Deutscher,  Molli Hazard, MD;  Location: [facility];  Service: General;  Laterality: Left;   COLONOSCOPY     none      MEDICATIONS: Current Outpatient Medications on File Prior to Visit  Medication Sig Dispense Refill   amLODipine (NORVASC) 2.5 MG tablet TAKE 1 TABLET(2.5 MG) BY MOUTH DAILY 90 tablet 3   Ascorbic Acid (VITAMIN C PO) Take 1 tablet by  mouth daily.     BIOTIN PO Take 1 tablet by mouth daily.     Cholecalciferol (VITAMIN D3 PO) Take 1 capsule by mouth daily.     diphenhydrAMINE HCl, Sleep, 50 MG CAPS Take 50 mg by mouth at bedtime.     doxylamine, Sleep, (UNISOM) 25 MG tablet Take 25 mg by mouth at bedtime as needed for sleep.     ELDERBERRY PO Take by mouth.     fluticasone (FLONASE) 50 MCG/ACT nasal spray Place 1 spray into both nostrils daily as needed.     glucose blood (ACCU-CHEK GUIDE) test strip Check blood sugar three times daily 100 each 11   Lancets (ACCU-CHEK MULTICLIX) lancets Check blood sugar three times daily 100 each 11   losartan (COZAAR) 100 MG tablet Take 1 tablet (100 mg total) by mouth daily. 90 tablet 3   metFORMIN (GLUCOPHAGE-XR) 500 MG 24 hr tablet Take 1 tablet (500 mg total) by mouth 2 (two) times daily with a meal. 180 tablet 3   metoprolol succinate (TOPROL-XL) 25 MG 24 hr tablet Take 0.5 tablets (12.5 mg total) by mouth daily. 90 tablet 3   Multiple Vitamin (MULTIVITAMIN WITH MINERALS) TABS tablet Take 1 tablet by mouth daily.     rosuvastatin (CRESTOR) 5 MG tablet TAKE 1 TABLET(5 MG) BY MOUTH 1 TIME A WEEK 13 tablet 3   vitamin E 1000 UNIT capsule Take 1,000 Units by mouth daily.     No current facility-administered medications on file prior to visit.    ALLERGIES: Allergies  Allergen Reactions   Codeine Nausea Only   Penicillins Nausea And Vomiting, Swelling and Other (See Comments)    Has patient had a PCN reaction causing immediate rash, facial/tongue/throat swelling, SOB or lightheadedness with hypotension: Yes Has patient had a PCN reaction causing severe rash involving mucus membranes or skin necrosis: No Has patient had a PCN reaction that required hospitalization: No Has patient had a PCN reaction occurring within the last 10 years: No If all of the above answers are "NO", then may proceed with Cephalosporin use.    Latex Rash    FAMILY HISTORY: Family History  Problem  Relation Age of Onset   Stroke Mother        31s, and father 73s   Hypertension Mother        and father   Stroke Brother        in 85s   Colon cancer Neg Hx    Esophageal cancer Neg Hx    Rectal cancer Neg Hx    Stomach cancer Neg Hx     SOCIAL HISTORY: Social History   Socioeconomic History   Marital status: Married    Spouse name: Not on file   Number of children: Not on file   Years of education: Not on file   Highest education level: Not on file  Occupational History   Not on file  Tobacco Use   Smoking status: Never   Smokeless tobacco: Never  Vaping Use   Vaping Use: Never used  Substance and Sexual Activity   Alcohol use: No  Drug use: No   Sexual activity: Not Currently    Partners: Male  Other Topics Concern   Not on file  Social History Narrative   Married, 4 children (daughter Charisma patient here, husband Mena Goes, son Mena Goes). 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      Hobbies: time with family, watching news, reading      Right handed         Social Determinants of Health   Financial Resource Strain: Not on file  Food Insecurity: Not on file  Transportation Needs: Not on file  Physical Activity: Not on file  Stress: Not on file  Social Connections: Not on file  Intimate Partner Violence: Not on file     PHYSICAL EXAM: Vitals:   06/24/22 1029  BP: 138/65  Pulse: 67  SpO2: 98%   General: No acute distress Head:  Normocephalic/atraumatic Skin/Extremities: No rash, no edema Neurological Exam: Mental status: alert and awake, no dysarthria or aphasia, Fund of knowledge is appropriate.  Attention and concentration are normal.    Cranial nerves: CN I: not tested CN II: pupils equal, round, visual fields intact CN III, IV, VI:  full range of motion, no nystagmus, no ptosis CN V: facial sensation intact CN VII: upper and lower face symmetric CN VIII:  hearing intact to conversation Bulk & Tone: normal, no fasciculations. Motor: 5/5 throughout with no pronator drift. Sensation: intact to light touch, cold, pin, vibration sense.  No extinction to double simultaneous stimulation.  Romberg test negative Deep Tendon Reflexes: +2 throughout Cerebellar: no incoordination on finger to nose testing Gait: narrow-based and steady, able to tandem walk adequately. Tremor: none   IMPRESSION: This is a pleasant 67 year old right-handed woman with a history of hypertension, palpitations, hyperlipidemia, DM, presenting for evaluation of syncope and headaches. Her neurological exam is normal. Agree that syncopal episode on 05/07/21 likely due to hypovolemia rather than neurological etiology. MRI brain unremarkable. Headaches suggestive of tension-type headaches. We discussed the option of starting a daily preventative medication, she would like to hold off. With headaches, daytime drowsiness, and snoring, home sleepy study will be ordered to assess for OSA. She was advised to keep a calendar of her headaches, call for any changes. Follow-up in 6 months or earlier if needed.    Thank you for allowing me to participate in the care of this patient. Please do not hesitate to call for any questions or concerns.   Patrcia Dolly, M.D.  CC: Dr. Durene Cal

## 2022-07-08 ENCOUNTER — Ambulatory Visit: Payer: Federal, State, Local not specified - PPO | Admitting: Family Medicine

## 2022-07-21 ENCOUNTER — Ambulatory Visit: Payer: Federal, State, Local not specified - PPO | Admitting: Cardiovascular Disease

## 2022-07-21 ENCOUNTER — Ambulatory Visit (HOSPITAL_COMMUNITY): Payer: Federal, State, Local not specified - PPO | Attending: Cardiovascular Disease

## 2022-07-21 DIAGNOSIS — H93A9 Pulsatile tinnitus, unspecified ear: Secondary | ICD-10-CM | POA: Insufficient documentation

## 2022-07-21 DIAGNOSIS — R55 Syncope and collapse: Secondary | ICD-10-CM | POA: Insufficient documentation

## 2022-07-21 LAB — ECHOCARDIOGRAM COMPLETE
Area-P 1/2: 3.03 cm2
S' Lateral: 2.6 cm

## 2022-07-22 ENCOUNTER — Ambulatory Visit (HOSPITAL_COMMUNITY)
Admission: RE | Admit: 2022-07-22 | Discharge: 2022-07-22 | Disposition: A | Discharge status: Home / Self Care | Payer: Federal, State, Local not specified - PPO | Source: Ambulatory Visit | Attending: Cardiovascular Disease | Admitting: Cardiovascular Disease

## 2022-07-22 DIAGNOSIS — R55 Syncope and collapse: Secondary | ICD-10-CM | POA: Diagnosis not present

## 2022-07-22 DIAGNOSIS — H93A9 Pulsatile tinnitus, unspecified ear: Secondary | ICD-10-CM | POA: Diagnosis not present

## 2022-07-25 ENCOUNTER — Telehealth: Payer: Self-pay | Admitting: Cardiovascular Disease

## 2022-07-25 NOTE — Telephone Encounter (Signed)
Patient is returning call in regards to results. Requesting return call.  

## 2022-07-28 ENCOUNTER — Telehealth: Payer: Self-pay

## 2022-07-28 MED ORDER — LOSARTAN POTASSIUM 100 MG PO TABS: 100.0000 mg | ORAL_TABLET | Freq: Every day | ORAL | 3 refills | Status: DC

## 2022-07-28 NOTE — Telephone Encounter (Signed)
-----   Message from Vesta Mixer, MD sent at 07/22/2022  7:15 AM EDT ----- LVEF is normal at 60-65% Grade I DD Trivial MR Trivial TR  There is no LVH, normal LV wall thickness

## 2022-07-28 NOTE — Telephone Encounter (Signed)
Patient understands and also requests refill on Losartan. Medication sent to pharmacy on file

## 2022-07-29 NOTE — Telephone Encounter (Signed)
Completed in separate encounter

## 2022-08-16 ENCOUNTER — Ambulatory Visit (HOSPITAL_BASED_OUTPATIENT_CLINIC_OR_DEPARTMENT_OTHER): Payer: Federal, State, Local not specified - PPO | Attending: Neurology | Admitting: Internal Medicine

## 2022-08-16 DIAGNOSIS — R400 Somnolence: Secondary | ICD-10-CM

## 2022-08-22 NOTE — Progress Notes (Signed)
Cardiology Office Note:    Date:  08/22/2022   ID:  Dana Mcmahon, DOB [birth year], MRN [id number]  PCP:  Shelva Majestic, MD  Cardiologist:  Effie Janoski   Problem List 1. Palpitations  2.  Diabetes mellitus 3.  Hyperlipidemia   Referring MD: Shelva Majestic, MD   Chief Complaint  Patient presents with   Hypertension         Prior notes:     Dana Mcmahon is a 67 y.o. female with a hx of palpitations. Feels her Heart racing Associated with chest heaviness Last for several minutes, Different times of the day .   Has woken her up from sleep on occasion Exercises reguarly - walks on the treadmill - ,  No issues while working out, she occasionally symptoms right after working out.  These are associated with some mild lightheadedness.  Similar palpitations in 2016 - Holter showed PACs and PVCs   August 07, 2017:  Dana Mcmahon is seen today for follow up of her PVCs Tried toprol XL.  Has lots of fatigue.   No energy to do much  Not exercisig     March 08, 2019:  Dana Mcmahon is seen today for follow-up of her prior palpitations.  She is had a Holter monitor that showed premature atrial contractions and premature ventricular contractions.  I saw her a year ago.  At that time we had her on low-dose Toprol-XL which caused some fatigue.  June 05, 2020: Dana Mcmahon is seen today for follow up of her palpitatios /PVCs.and PACs BP is slightly elevated.  Has mild leg edema - is on amlodipine  Still eats salt on occasion  Has tried HCTZ but she developed gout in 2015  Encourage more cardio exercise   September 13, 2021 Dana Mcmahon is seen for follow up of her hypertension and palpitations. Was on a mission trip this past weekend  - got in around midnight.  Very little sleep over the weekend  Is fatigued . As a result, her BP is elevated.   She has been trying to eat healthy.  May 23, 2022 Dana Mcmahon is seen for follow up of her HTN, palpitations On May 08, 2022 she passed out  while at [employer]  On that day, she had no breakfast ,  had a little to drink  Then church, then went to the restaurant  The episode occurred around 1:30 -2:00 PM  No episodes since then  No CP , no dyspnea  Her echocardiogram from April, 2017 revealed hyperdynamic left ventricular systolic function with an EF of 65 to 70%.  She had grade 1 diastolic dysfunction.  No significant aortic or mitral valve disease.  Has not been walking recently ,   Also has pulsitile tennitus  Will get a carotid duplex for further evaluation   She thinks her glucose may have been low    July 2024 Dana Mcmahon is seen for follow up of her HTN, palpitations Had a previous episode of syncope while waiting for her food at a restaurant Was likely due to volume depletion,  she had not had much to eat or drink that day .   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     Allergies:   Codeine, Penicillins, and Latex   Social History   Socioeconomic History   Marital status: Married    Spouse name: Not on file   Number of children: Not on file   Years of education: Not on file   Highest education level: Not on file  Occupational History   Not on file   Tobacco Use   Smoking status: Never   Smokeless tobacco: Never  Vaping Use   Vaping Use: Never used  Substance and Sexual Activity   Alcohol use: No   Drug use: No   Sexual activity: Not Currently    Partners: Male  Other Topics Concern   Not on file  Social History Narrative   Married, 4 children (daughter Charisma patient here, husband Mena Goes, son [place]). 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      Hobbies: time with family, watching news, reading      Right handed         Social Determinants of Health   Financial Resource Strain: Not on file  Food Insecurity: Not on file  Transportation Needs: Not on file  Physical Activity: Not on file  Stress: Not on file  Social Connections: Not on file     Family History: The patient's family history includes Hypertension in her mother; Stroke in her brother and mother. There is no history of Colon cancer, Esophageal cancer, Rectal cancer, or Stomach cancer.  ROS:   Please see the history of present illness.     All other systems reviewed and are negative.  EKGs/Labs/Other Studies Reviewed:    The following studies were reviewed today:    Recent Labs: 05/24/2022: ALT 14; BUN 15; Creatinine, Ser 0.85; Hemoglobin 13.8; Platelets 311.0; Potassium 4.2; Sodium 138  Recent Lipid Panel    Component Value Date/Time   CHOL 212 (H) 11/26/2021 1013   TRIG 392.0 (H) 11/26/2021 1013   HDL 48.30 11/26/2021 1013   CHOLHDL 4 11/26/2021 1013   VLDL 78.4 (H) 11/26/2021 1013   LDLDIRECT 106.0 11/26/2021 1013    Physical Exam:     Physical Exam: There were no vitals taken for this visit.  No BP recorded.  {Refresh Note OR Click here to enter BP  :1}***    GEN:  Well nourished, well developed in no acute distress HEENT: Normal NECK: No JVD; No carotid bruits LYMPHATICS: No lymphadenopathy CARDIAC: RRR ***, no murmurs, rubs,  gallops RESPIRATORY:  Clear to auscultation without rales, wheezing or rhonchi  ABDOMEN: Soft, non-tender, non-distended MUSCULOSKELETAL:  No edema; No deformity  SKIN: Warm and dry NEUROLOGIC:  Alert and oriented x 3     EKG:    ASSESSMENT:    No diagnosis found.   PLAN:       1.  Palpitations -  stable   2.  Syncope: Dana Mcmahon had an episode of syncope while waiting for her food at a restaurant: The episode occurred on Sunday.  She woke up and did not have any breakfast.  She had very little to drink.  She went to church and then later went to a restaurant.  While waiting for her food around 1 30-2 o'clock she was sitting down and suddenly had an episode of tunnel vision and syncope.  She woke up very quickly.   I suspect that her episode was due to hypovolemia.  I encouraged her to eat and drink regularly for breakfast.  Will place an event monitor on her for 14 days.  Will get an echocardiogram for further evaluation of her soft cardiac murmur and her episode of syncope.   3.  Pulsatile tinnitus:      3.  HTN:           Medication Adjustments/Labs and Tests Ordered: Current medicines are reviewed at length with the patient today.  Concerns regarding medicines are outlined above.  No orders of the defined types were placed in this encounter.  No orders of the defined types were placed in this encounter.  Will have her see Dr. Shari Prows or Jacques Navy upon my discharge   Signed, Kristeen Miss, MD  08/22/2022 8:47 PM    [facility]

## 2022-08-23 ENCOUNTER — Encounter: Payer: Self-pay | Admitting: Cardiovascular Disease

## 2022-08-23 ENCOUNTER — Ambulatory Visit: Payer: Federal, State, Local not specified - PPO | Attending: Cardiovascular Disease | Admitting: Cardiovascular Disease

## 2022-09-15 ENCOUNTER — Encounter: Payer: Self-pay | Admitting: Cardiovascular Disease

## 2022-09-15 ENCOUNTER — Telehealth: Payer: Self-pay | Admitting: Cardiovascular Disease

## 2022-09-15 MED ORDER — LOSARTAN POTASSIUM 100 MG PO TABS: 100.0000 mg | ORAL_TABLET | Freq: Every day | ORAL | 3 refills | Status: DC

## 2022-09-15 NOTE — Telephone Encounter (Signed)
Patient states pharmacy did not receive Rx for losartan that was sent in last month.  Verified pharmacy as [facility] on [address], resent Rx for losartan.  Patient expressed appreciation for assistance.

## 2022-09-15 NOTE — Telephone Encounter (Signed)
Pt c/o medication issue:  1. Name of Medication:   losartan (COZAAR) 100 MG tablet    2. How are you currently taking this medication (dosage and times per day)?   3. Are you having a reaction (difficulty breathing--STAT)?   4. What is your medication issue? Patient states that medication was not received by pharmacy and would like for this to be called in again.

## 2022-09-30 ENCOUNTER — Other Ambulatory Visit: Payer: Self-pay

## 2022-09-30 MED ORDER — METOPROLOL SUCCINATE ER 25 MG PO TB24: 12.5000 mg | ORAL_TABLET | Freq: Every day | ORAL | 2 refills | Status: DC

## 2022-10-04 ENCOUNTER — Other Ambulatory Visit: Payer: Self-pay | Admitting: Family Medicine

## 2022-10-10 ENCOUNTER — Other Ambulatory Visit: Payer: Self-pay | Admitting: Family Medicine

## 2022-10-16 ENCOUNTER — Encounter: Payer: Self-pay | Admitting: Cardiovascular Disease

## 2022-10-16 NOTE — Progress Notes (Unsigned)
Cardiology Office Note:    Date:  10/17/2022   ID:  Dana Mcmahon, DOB 10-Apr-1955, MRN [id number]  PCP:  Shelva Majestic, MD  Cardiologist:  Kristapher [place]   Problem List 1. Palpitations  2.  Diabetes mellitus 3.  Hyperlipidemia   Referring MD: Shelva Majestic, MD   Chief Complaint  Patient presents with   Hypertension         Prior notes:     Dana Mcmahon is a 67 y.o. female with a hx of palpitations. Feels her Heart racing Associated with chest heaviness Last for several minutes, Different times of the day .   Has woken her up from sleep on occasion Exercises reguarly - walks on the treadmill - ,  No issues while working out, she occasionally symptoms right after working out.  These are associated with some mild lightheadedness.  Similar palpitations in 2016 - Holter showed PACs and PVCs   August 07, 2017:  Dana Mcmahon is seen today for follow up of her PVCs Tried toprol XL.  Has lots of fatigue.   No energy to do much  Not exercisig     March 08, 2019:  Dana Mcmahon is seen today for follow-up of her prior palpitations.  She is had a Holter monitor that showed premature atrial contractions and premature ventricular contractions.  I saw her a year ago.  At that time we had her on low-dose Toprol-XL which caused some fatigue.  June 05, 2020: Dana Mcmahon is seen today for follow up of her palpitatios /PVCs.and PACs BP is slightly elevated.  Has mild leg edema - is on amlodipine  Still eats salt on occasion  Has tried HCTZ but she developed gout in 2015  Encourage more cardio exercise   September 13, 2021 Dana Mcmahon is seen for follow up of her hypertension and palpitations. Was on a mission trip this past weekend  - got in around midnight.  Very little sleep over the weekend  Is fatigued . As a result, her BP is elevated.   She has been trying to eat healthy.  May 23, 2022 Dana Mcmahon is seen for follow up of her HTN, palpitations On May 08, 2022 she passed out  while at [employer]  On that day, she had no breakfast ,  had a little to drink  Then church, then went to the restaurant  The episode occurred around 1:30 -2:00 PM  No episodes since then  No CP , no dyspnea  Her echocardiogram from April, 2017 revealed hyperdynamic left ventricular systolic function with an EF of 65 to 70%.  She had grade 1 diastolic dysfunction.  No significant aortic or mitral valve disease.  Has not been walking recently ,   Also has pulsitile tennitus  Will get a carotid duplex for further evaluation   She thinks her glucose may have been low    August 23, 2022  No show   Aug. 26, 2026  Dana Mcmahon is seen for follow up of her HTN, palpitations Had a previous episode of syncope while waiting for her food at a restaurant Was likely due to volume depletion,  she had not had much to eat or drink that day .  Not much exercise .  She wants to see Dr.  Timoteo Gaul upon my retirement     Past Medical History:  Diagnosis Date   Allergic rhinitis    Anxiety    Arthritis    DM type 2 (diabetes mellitus, type 2) (HCC)  Heart murmur    Hyperlipidemia    per old records, pt has refused cholesto-lowering meds   Hypertension    Neck pain, chronic    Mild DDD, no signif progression (multiple MRI's: 2001, 2003, 2005, 2007)   Obesity, Class I, BMI 30-34.9    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   Right    Past Surgical History:  Procedure Laterality Date   ARTERY BIOPSY Left 06/13/2017   Procedure: LEFT TEMPORAL ARTERY BIOPSY;  Surgeon: Luretha Murphy, MD;  Location: [facility];  Service: General;  Laterality: Left;   COLONOSCOPY     none      Current Medications: Current Meds  Medication Sig   amLODipine (NORVASC) 2.5 MG tablet TAKE 1 TABLET(2.5 MG) BY MOUTH DAILY   Ascorbic Acid (VITAMIN C PO) Take 1 tablet by mouth daily.   BIOTIN PO Take 1 tablet by mouth daily.   Cholecalciferol (VITAMIN D3 PO) Take 1 capsule by mouth daily.   doxylamine, Sleep, (UNISOM)  25 MG tablet Take 25 mg by mouth at bedtime as needed for sleep.   ELDERBERRY PO Take by mouth.   fluticasone (FLONASE) 50 MCG/ACT nasal spray Place 1 spray into both nostrils daily as needed.   glucose blood (ACCU-CHEK GUIDE) test strip Check blood sugar three times daily   Lancets (ACCU-CHEK MULTICLIX) lancets Check blood sugar three times daily   losartan (COZAAR) 100 MG tablet Take 1 tablet (100 mg total) by mouth daily.   metFORMIN (GLUCOPHAGE-XR) 500 MG 24 hr tablet TAKE 1 TABLET(500 MG) BY MOUTH TWICE DAILY WITH A MEAL   metoprolol succinate (TOPROL-XL) 25 MG 24 hr tablet Take 0.5 tablets (12.5 mg total) by mouth daily.   Multiple Vitamin (MULTIVITAMIN WITH MINERALS) TABS tablet Take 1 tablet by mouth daily.   rosuvastatin (CRESTOR) 5 MG tablet TAKE 1 TABLET(5 MG) BY MOUTH 1 TIME A WEEK   vitamin E 1000 UNIT capsule Take 1,000 Units by mouth daily.   [DISCONTINUED] diphenhydrAMINE HCl, Sleep, 50 MG CAPS Take 50 mg by mouth at bedtime.     Allergies:   Codeine, Penicillins, and Latex   Social History   Socioeconomic History   Marital status: Married    Spouse name: Not on file   Number of children: Not on file   Years of education: Not on file   Highest education level: Not on file  Occupational History   Not on file  Tobacco Use   Smoking status: Never   Smokeless tobacco: Never  Vaping Use   Vaping status: Never Used  Substance and Sexual Activity   Alcohol use: No   Drug use: No   Sexual activity: Not Currently    Partners: Male  Other Topics Concern   Not on file  Social History Narrative   Married, 4 children (daughter Charisma patient here, husband Mena Goes, son [place]). 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      Hobbies: time with family, watching news, reading      Right handed         Social Determinants of Health   Financial Resource Strain: Not on file  Food  Insecurity: Not on file  Transportation Needs: Not on file  Physical Activity: Not on file  Stress: Not on file  Social Connections: Not on file     Family History: The patient's family history includes Hypertension in her mother;  Stroke in her brother and mother. There is no history of Colon cancer, Esophageal cancer, Rectal cancer, or Stomach cancer.  ROS:   Please see the history of present illness.     All other systems reviewed and are negative.  EKGs/Labs/Other Studies Reviewed:    The following studies were reviewed today:    Recent Labs: 05/24/2022: ALT 14; BUN 15; Creatinine, Ser 0.85; Hemoglobin 13.8; Platelets 311.0; Potassium 4.2; Sodium 138  Recent Lipid Panel    Component Value Date/Time   CHOL 212 (H) 11/26/2021 1013   TRIG 392.0 (H) 11/26/2021 1013   HDL 48.30 11/26/2021 1013   CHOLHDL 4 11/26/2021 1013   VLDL 78.4 (H) 11/26/2021 1013   LDLDIRECT 106.0 11/26/2021 1013    Physical Exam:     Physical Exam: Blood pressure 134/70, pulse 70, height 5\' 4"  (1.626 m), weight 166 lb (75.3 kg), SpO2 97%.       GEN:  Well nourished, well developed in no acute distress HEENT: Normal NECK: No JVD; No carotid bruits LYMPHATICS: No lymphadenopathy CARDIAC: RRR , no murmurs, rubs, gallops RESPIRATORY:  Clear to auscultation without rales, wheezing or rhonchi  ABDOMEN: Soft, non-tender, non-distended MUSCULOSKELETAL:  No edema; No deformity  SKIN: Warm and dry NEUROLOGIC:  Alert and oriented x 3     EKG:    ASSESSMENT:    No diagnosis found.   PLAN:       1.  Palpitations -  stable   2.  Syncope: Khloi had an episode of syncope while waiting for her food at a restaurant: The episode occurred on Sunday.  She woke up and did not have any breakfast.  She had very little to drink.  She went to church and then later went to a restaurant.  While waiting for her food around 1 30-2 o'clock she was sitting down and suddenly had an episode of tunnel vision and  syncope.  She woke up very quickly.   I suspect that her episode was due to hypovolemia.  I encouraged her to eat and drink regularly for breakfast.  Will place an event monitor on her for 14 days.  Will get an echocardiogram for further evaluation of her soft cardiac murmur and her episode of syncope.   3.  Pulsatile tinnitus:      3.  HTN:           Medication Adjustments/Labs and Tests Ordered: Current medicines are reviewed at length with the patient today.  Concerns regarding medicines are outlined above.  No orders of the defined types were placed in this encounter.  No orders of the defined types were placed in this encounter.  Will have her see Dr. Shari Prows or Jacques Navy upon my discharge   Signed, Kristeen Miss, MD  10/17/2022 4:31 PM    [facility]

## 2022-10-17 ENCOUNTER — Encounter: Payer: Self-pay | Admitting: Cardiovascular Disease

## 2022-10-17 ENCOUNTER — Ambulatory Visit: Payer: Federal, State, Local not specified - PPO | Attending: Cardiovascular Disease | Admitting: Cardiovascular Disease

## 2022-10-17 VITALS — BP 134/70 | HR 70 | Ht 64.0 in | Wt 166.0 lb

## 2022-10-17 DIAGNOSIS — I10 Essential (primary) hypertension: Secondary | ICD-10-CM

## 2022-10-17 NOTE — Patient Instructions (Signed)
Medication Instructions:   Your physician recommends that you continue on your current medications as directed. Please refer to the Current Medication list given to you today.  *If you need a refill on your cardiac medications before your next appointment, please call your pharmacy*    Follow-Up: At [facility], you and your health needs are our priority.  As part of our continuing mission to provide you with exceptional heart care, we have created designated Provider Care Teams.  These Care Teams include your primary Cardiologist (physician) and Advanced Practice Providers (APPs -  Physician Assistants and Nurse Practitioners) who all work together to provide you with the care you need, when you need it.  We recommend signing up for the patient portal called "MyChart".  Sign up information is provided on this After Visit Summary.  MyChart is used to connect with patients for Virtual Visits (Telemedicine).  Patients are able to view lab/test results, encounter notes, upcoming appointments, etc.  Non-urgent messages can be sent to your provider as well.   To learn more about what you can do with MyChart, go to [email or web address].    Your next appointment:   1 year(s)  Provider:   Philip Nahser, MD       

## 2022-11-15 ENCOUNTER — Ambulatory Visit: Payer: Federal, State, Local not specified - PPO | Admitting: Family Medicine

## 2022-11-15 ENCOUNTER — Encounter: Payer: Self-pay | Admitting: Family Medicine

## 2022-11-15 VITALS — BP 140/70 | HR 67 | Temp 97.9°F | Ht 64.0 in | Wt 166.4 lb

## 2022-11-15 DIAGNOSIS — E785 Hyperlipidemia, unspecified: Secondary | ICD-10-CM

## 2022-11-15 DIAGNOSIS — I10 Essential (primary) hypertension: Secondary | ICD-10-CM | POA: Diagnosis not present

## 2022-11-15 DIAGNOSIS — Z7984 Long term (current) use of oral hypoglycemic drugs: Secondary | ICD-10-CM

## 2022-11-15 DIAGNOSIS — E119 Type 2 diabetes mellitus without complications: Secondary | ICD-10-CM | POA: Diagnosis not present

## 2022-11-15 DIAGNOSIS — E1169 Type 2 diabetes mellitus with other specified complication: Secondary | ICD-10-CM | POA: Diagnosis not present

## 2022-11-15 MED ORDER — FLUTICASONE PROPIONATE 50 MCG/ACT NA SUSP: 2.0000 | Freq: Every day | NASAL | 5 refills | Status: AC | PRN

## 2022-11-15 NOTE — Patient Instructions (Addendum)
Have copy of diabetic eye exam sent to [place] at [phone].  Let us know when you get your flu vaccine at [facility].  blood pressure is above goal 135/85 or less ideally on average- she agrees to do some home monitoring over next 2 weeks and update me through [employer]   - does have some post nasal drip and using Flonase just as needed- recommend trial for 2-3 weeks  of 2 sprays each nostril consistent use to see if that helps- if does not - can refer to ENT for their opinion   Use cetaphil or vanicream per dermatology for external ear canal- if that does not keep things at bay schedule follow up with dermatology   Schedule a lab visit at the check out desk within 2 weeks. Return for future fasting labs meaning nothing but water after midnight please. Ok to take your medications with water.   Recommended follow up: Return in about 4 months (around 03/17/2023) for physical or sooner if needed.Schedule b4 you leave.

## 2022-11-15 NOTE — Progress Notes (Signed)
Phone [phone] In person visit   Subjective:   Dana Mcmahon is a 67 y.o. year old very pleasant female patient who presents for/with See problem oriented charting Chief Complaint  Patient presents with   Medical Management of Chronic Issues   Hypertension   Hyperlipidemia   Diabetes    DEE schedule for next month.    Past Medical History-  Patient Active Problem List   Diagnosis Date Noted   Diabetes mellitus type II, controlled (HCC) 05/10/2013    Priority: High   Headache, unspecified headache type 06/03/2017    Priority: Medium    History of adenomatous polyp of colon 07/25/2016    Priority: Medium    Upper airway cough syndrome 01/22/2016    Priority: Medium    Hyperlipidemia associated with type 2 diabetes mellitus (HCC)     Priority: Medium    Primary hypertension 05/10/2013    Priority: Medium    Palpitations 05/10/2013    Priority: Medium    COVID-19 vaccine series declined 03/16/2020    Priority: Low   Osteoarthritis, hand 12/16/2014    Priority: Low   Allergic rhinitis 07/24/2014    Priority: Low   Obesity, Class I, BMI 30-34.9     Priority: Low   Dizzy spells 08/08/2013    Priority: Low    Medications- reviewed and updated Current Outpatient Medications  Medication Sig Dispense Refill   amLODipine (NORVASC) 2.5 MG tablet TAKE 1 TABLET(2.5 MG) BY MOUTH DAILY 90 tablet 3   Ascorbic Acid (VITAMIN C PO) Take 1 tablet by mouth daily.     BIOTIN PO Take 1 tablet by mouth daily.     Cholecalciferol (VITAMIN D3 PO) Take 1 capsule by mouth daily.     ELDERBERRY PO Take by mouth.     glucose blood (ACCU-CHEK GUIDE) test strip Check blood sugar three times daily 100 each 11   Lancets (ACCU-CHEK MULTICLIX) lancets Check blood sugar three times daily 100 each 11   losartan (COZAAR) 100 MG tablet Take 1 tablet (100 mg total) by mouth daily. 90 tablet 3   metFORMIN (GLUCOPHAGE-XR) 500 MG 24 hr tablet TAKE 1 TABLET(500 MG) BY MOUTH TWICE DAILY  WITH A MEAL 180 tablet 3   metoprolol succinate (TOPROL-XL) 25 MG 24 hr tablet Take 0.5 tablets (12.5 mg total) by mouth daily. 45 tablet 2   Multiple Vitamin (MULTIVITAMIN WITH MINERALS) TABS tablet Take 1 tablet by mouth daily.     rosuvastatin (CRESTOR) 5 MG tablet TAKE 1 TABLET(5 MG) BY MOUTH 1 TIME A WEEK 13 tablet 3   vitamin E 1000 UNIT capsule Take 1,000 Units by mouth daily.     doxylamine, Sleep, (UNISOM) 25 MG tablet Take 25 mg by mouth at bedtime as needed for sleep. (Patient not taking: Reported on 11/15/2022)     fluticasone (FLONASE) 50 MCG/ACT nasal spray Place 2 sprays into both nostrils daily as needed. 16 g 5   No current facility-administered medications for this visit.     Objective:  BP (!) 140/70 Comment: no improvement on repeat  Pulse 67   Temp 97.9 F (36.6 C)   Ht 5\' 4"  (1.626 m)   Wt 166 lb 6.4 oz (75.5 kg)   LMP  (LMP Unknown)   SpO2 95%   BMI 28.56 kg/m  Gen: NAD, resting comfortably Tympanic membrane normal bilaterally including external and internal canal In pharynx some signs of postnasal drip, no cervical lymphadenopathy CV: RRR no murmurs rubs or gallops Lungs: CTAB  no crackles, wheeze, rhonchi Abdomen: soft/nontender/nondistended/normal bowel sounds. No rebound or guarding.  Ext: no edema Skin: warm, dry     Assessment and Plan   #social update- a lot of work stress and hours going back to 12 hours.   #Syncope- patient prior to march visit had episode sitting down at restaurant where she got tunneling of vision and sensation of heat in her body- got up to walk out of restaurant with husband support as she felt poorly and next thing she knew she was found on floor. Due to unexplained syncope (possibly vasovagal) - EKG done, neurology exam reassuring, encouraged to check blood sugar and pressure if recurs, referred to cardiology, MRI of brain given long term headaches and referred to neurology -cardiology thought- likely from  dehydration/hypovolemia- little to drink, hadn't hand breakfast- echocardiogram reassuring other thank diastolic dysfunction -neurology thought hypovolemia likely cause as well. Headache(s) thought tension type. Recommended OSA testing as well with 6 month follow up from may -today reports no recurrent issues with the sycnope but ongoing tension headache(s)   # Diabetes S: compliant with metformin 500mg  twice daily XR- down to once a day as started with diarrhea again . Was having diarrhea on higher doses IR. - Possibly could add [place] if needed if gets high and cannot control with diet/exercise.  Lab Results  Component Value Date   HGBA1C 7.3 (H) 05/24/2022   HGBA1C 7.2 (A) 04/01/2022   HGBA1C 8.1 (H) 11/26/2021  A/P:with lowering metformiin I'm nervous that a1c may be higher  - she's had some times where she feels slightly disoriented and weak and shaky-  she agrees to check sugars in those moments- possibly low or high but I worry more about high with her a1c and intolerance of higher dose metformin. Also check blood pressure if feels that way  Hyperlipidemia  S: Compliant with  rosuvastatin 5 mg once a week.   Lab Results  Component Value Date   CHOL 212 (H) 11/26/2021   HDL 48.30 11/26/2021   LDLDIRECT 106.0 11/26/2021   TRIG 392.0 (H) 11/26/2021   CHOLHDL 4 11/26/2021  A/P: max dose she has agreed to- we will continue current medications even though cholesterol above goal - will be due next visit fasting ideally  Hypertension S:compliant with amlodipine 2.5 mg once a day, losartan 100 mg, and metoprolol 25mg  extended release.    Stopped HCTZ due to potentially causing gout.  Stopped ACE inhibitor in the past due to cough but later restarted ARB. A/P:  blood pressure is above goal 135/85 or less ideally- she agrees to do some home monitoring over next 2 weeks and update me through [employer]. For now continue current medications- she does not want to add medicine at present- liekly  increase amlodipine as next step   #some left throat soreness since getting brace in mouth. Gets mild soreness in back of tongue  - does have some post nasal drip and using Flonase just as needed- recommend trial for 2-3 weeks consistent use to see if that helps- if does not - can refer to ENT for their opinion   #eczema in ear- was given otic steroid drops by dermatology and recommended to use as needed but to control with cetaphil or cerave. She has only tried steroid drops intermittently. I encouraged her to use the baseline cream daily or tiwce daily and then if flares to call dermatology back. Did not appear inflamed today  Recommended follow up: Return in about 4 months (around 03/17/2023)  for physical or sooner if needed.Schedule b4 you leave. Future Appointments  Date Time Provider Department Center  01/13/2023  3:00 PM Van Clines, MD [facility] None    Lab/Order associations: coffee with creamer   ICD-10-CM   1. Controlled type 2 diabetes mellitus without complication, without long-term current use of insulin (HCC)  E11.9 CBC with Differential/Platelet    Comprehensive metabolic panel    Hemoglobin A1c    Urine Microalbumin w/creat. ratio    Lipid panel    CANCELED: Urine Microalbumin w/creat. ratio    CANCELED: CBC with Differential/Platelet    CANCELED: Comprehensive metabolic panel    CANCELED: Hemoglobin A1c    2. Primary hypertension  I10     3. Hyperlipidemia associated with type 2 diabetes mellitus (HCC)  E11.69 Lipid panel   E78.5       Meds ordered this encounter  Medications   fluticasone (FLONASE) 50 MCG/ACT nasal spray    Sig: Place 2 sprays into both nostrils daily as needed.    Dispense:  16 g    Refill:  5    Return precautions advised.  Tana Conch, MD

## 2022-11-21 ENCOUNTER — Telehealth: Payer: Self-pay | Admitting: Family Medicine

## 2022-11-21 NOTE — Telephone Encounter (Signed)
Patient requests to be called re: she wants Dr. Durene Cal to know she had chest pain on 11/18/22.  States she no longer has chest pain and she has been monitoring her blood pressure since 11/18/22. Feeling much better.  Declined OV.

## 2022-12-02 NOTE — Telephone Encounter (Signed)
Error

## 2022-12-13 ENCOUNTER — Ambulatory Visit: Payer: Federal, State, Local not specified - PPO | Admitting: Cardiovascular Disease

## 2023-01-02 LAB — HM DIABETES EYE EXAM

## 2023-01-03 ENCOUNTER — Encounter: Payer: Self-pay | Admitting: Family Medicine

## 2023-01-13 ENCOUNTER — Encounter: Payer: Self-pay | Admitting: Neurology

## 2023-01-13 ENCOUNTER — Ambulatory Visit: Payer: Federal, State, Local not specified - PPO | Admitting: Neurology

## 2023-01-13 VITALS — BP 139/73 | HR 70 | Ht 64.0 in | Wt 167.8 lb

## 2023-01-13 DIAGNOSIS — G44219 Episodic tension-type headache, not intractable: Secondary | ICD-10-CM | POA: Diagnosis not present

## 2023-01-13 DIAGNOSIS — G44209 Tension-type headache, unspecified, not intractable: Secondary | ICD-10-CM

## 2023-01-13 MED ORDER — NORTRIPTYLINE HCL 10 MG PO CAPS: 10.0000 mg | ORAL_CAPSULE | Freq: Every day | ORAL | 6 refills | Status: DC

## 2023-01-13 NOTE — Progress Notes (Unsigned)
NEUROLOGY FOLLOW UP OFFICE NOTE  Dana Mcmahon [id number] 1955-10-30  HISTORY OF PRESENT ILLNESS: I had the pleasure of seeing Dana Mcmahon in follow-up in the neurology clinic on 01/13/2023.  The patient was last seen 6 months ago for syncope and headaches. Syncopal episode in 04/2022 consistent with hypovolemic event. None since then. She was also reporting frequent headaches.MRI brain no acute changes. She states she is having less headaches but they still occur 3-4 times a month. Pain is over the left temporal region. No nausea/vomiting, photo/phonophobia. They may be due to sleep deprivation, she usually gets 3-4 hours of sleep, rarely more than 6. She has a light headache currently. Sometimes she feels a little dizzy. No vision changes, focal numbness/tingling/weakness. She has been taking Unisom daily for sleep. She reports stress at work.    History on Initial Assessment 06/24/2022: This is a pleasant 67 year old right-handed woman with a history of hypertension, palpitations, hyperlipidemia, DM, presenting for evaluation of syncope and headaches. On 05/08/22, she passed out at a restaurant. She had not been sleeping well and had not eaten breakfast or drank much that day. She went to church, then to a restaurant where while sitting down she started having tunnel vision with things around her looking bright around them. She alerted her husband she was not feeling well and took 5 steps then her whole body felt weak. She does not recall feeling dizzy, she slid down on the wall beside her with loss of consciousness for 2-3 minutes. She felt very weak diffusely after, no tongue bite or incontinence. She felt she could not open her eyes but was very alert when she came to, saying no to EMS. She recalls being sweaty. She denies any prior history of syncope. No staring/unresponsive episodes, gaps in time, olfactory/gustatory hallucinations, deja vu, rising epigastric sensation, focal  numbness/tingling/weakness, myoclonic jerks. She sees Cardiology and had an unremarkable echocardiogram, Zio monitor no significant arrhythmias with rare very brief episodes of SVT, episode felt due to hypovolemia. Her son had seizures. She had a normal birth and early development.  There is no history of febrile convulsions, CNS infections such as meningitis/encephalitis, significant traumatic brain injury, neurosurgical procedures.  For the past 4 years, she has had sharp headaches over the midline from front to back lasting a few seconds. They occur 4-5 times a month, she had one yesterday. Sometimes pain is on the left temporal region. She was doing a lot of work yesterday and had a headache at the end of the day. No associated nausea/vomiting, photo/phonophobia, visual obscurations. She does not take any headache medication. She can continue to function but it slows her down. She has occasional neck pain. No diplopia, dysarthria/dysphagia, bowel/bladder dysfunction. She reports difficulty sleeping with 4 hours of sleep. She feels drowsy in the daytime and has been told she snores. She has a very stressful job, she has been working as Education administrator since 2007.   I personally reviewed brain MRI without contrast done 05/2022, no acute changes, there was mild to moderate chronic microvascular disease.   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     BIOTIN PO Take 1 tablet by mouth daily.     Cholecalciferol (VITAMIN D3 PO) Take 1 capsule by mouth daily.     doxylamine, Sleep, (UNISOM) 25 MG tablet Take 25 mg by mouth at bedtime as needed for sleep.     ELDERBERRY PO Take by mouth.     fluticasone (FLONASE) 50 MCG/ACT nasal spray Place 2 sprays into both nostrils daily as needed. 16 g 5   glucose blood (ACCU-CHEK GUIDE) test strip Check blood sugar three times daily 100 each 11   Lancets (ACCU-CHEK MULTICLIX) lancets Check blood sugar three times daily 100 each 11   losartan (COZAAR) 100 MG tablet Take 1 tablet (100 mg total) by mouth daily. 90 tablet 3   metFORMIN (GLUCOPHAGE-XR) 500 MG 24 hr tablet TAKE 1 TABLET(500 MG) BY MOUTH TWICE DAILY WITH A MEAL (Patient taking differently: daily.) 180 tablet 3   metoprolol succinate (TOPROL-XL) 25 MG 24 hr tablet Take 0.5 tablets (12.5 mg total) by mouth daily. 45 tablet 2   Multiple Vitamin (MULTIVITAMIN WITH MINERALS) TABS tablet Take 1 tablet by mouth daily.     rosuvastatin (CRESTOR) 5 MG tablet TAKE 1 TABLET(5 MG) BY MOUTH 1 TIME A WEEK 13 tablet 3   vitamin E 1000 UNIT capsule Take 1,000 Units by mouth daily.     No current facility-administered medications on file prior to visit.    ALLERGIES: Allergies  Allergen Reactions   Codeine Nausea Only   Penicillins Nausea And Vomiting, Swelling and Other (See Comments)    Has patient had a PCN reaction causing immediate rash, facial/tongue/throat swelling, SOB or lightheadedness with hypotension: Yes Has patient had a PCN reaction causing severe rash involving mucus membranes or skin necrosis: No Has patient had a PCN reaction that required hospitalization: No Has patient had a PCN reaction occurring within the last 10 years: No If all of the above answers are "NO", then may proceed with Cephalosporin use.    Latex Rash    FAMILY  HISTORY: Family History  Problem Relation Age of Onset   Stroke Mother        45s, and father 17s   Hypertension Mother        and father   Stroke Brother        in 10s   Colon cancer Neg Hx    Esophageal cancer Neg Hx    Rectal cancer Neg Hx    Stomach cancer Neg Hx     SOCIAL HISTORY: Social History   Socioeconomic History   Marital status: Married    Spouse name: Not on file   Number of children: Not on file   Years of education: Not on file   Highest education level: Not on file  Occupational History   Not on file  Tobacco Use   Smoking status: Never   Smokeless tobacco: Never  Vaping Use   Vaping status: Never Used  Substance and Sexual Activity   Alcohol use: No   Drug use: No   Sexual activity: Not Currently  Partners: Male  Other Topics Concern   Not on file  Social History Narrative   Married, 4 children (daughter Charisma patient here, husband Mena Goes, son Mena Goes). 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      Hobbies: time with family, watching news, reading      Right handed         Social Determinants of Health   Financial Resource Strain: Not on file  Food Insecurity: Not on file  Transportation Needs: Not on file  Physical Activity: Not on file  Stress: Not on file  Social Connections: Not on file  Intimate Partner Violence: Not on file     PHYSICAL EXAM: Vitals:   01/13/23 1443  BP: 139/73  Pulse: 70  SpO2: 96%   General: No acute distress Head:  Normocephalic/atraumatic Skin/Extremities: No rash, no edema Neurological Exam: alert and awake. No aphasia or dysarthria. Fund of knowledge is appropriate.  Attention and concentration are normal.   Cranial nerves: Pupils equal, round. Extraocular movements intact with no nystagmus. Visual fields full.  No facial asymmetry.  Motor: Bulk and tone normal, muscle strength 5/5 throughout with no pronator  drift.   Finger to nose testing intact.  Gait narrow-based and steady, able to tandem walk adequately.  Romberg negative.   IMPRESSION: This is a pleasant 67 yo RH woman with a history of hypertension, palpitations, hyperlipidemia, DM, who presented for syncope and headaches. Her neurological exam is normal. No further syncopal episodes since 05/07/21, likely due to hypovolemia. MRI brain unremarkable. She continues to report headaches 3-4 times a month with poor sleep. We discussed a trial of low dose nortriptyline 10mg  at bedtime, side effects discussed. She was advised to keep a calendar of the headaches, follow-up in 4 months, call for any changes.    Thank you for allowing me to participate in her care.  Please do not hesitate to call for any questions or concerns.     Patrcia Dolly, M.D.   CC: Dr. Durene Cal

## 2023-01-13 NOTE — Patient Instructions (Signed)
Good to see you!  Start Nortriptyline 10mg : take 1 capsule every night. See how you feel with sleep, let me know if any problems.   2. Follow-up in 4 months,call for any changes

## 2023-01-15 ENCOUNTER — Encounter: Payer: Self-pay | Admitting: Neurology

## 2023-03-10 ENCOUNTER — Other Ambulatory Visit: Payer: Self-pay | Admitting: Family Medicine

## 2023-05-19 ENCOUNTER — Encounter: Payer: Self-pay | Admitting: Neurology

## 2023-05-19 ENCOUNTER — Ambulatory Visit: Payer: Federal, State, Local not specified - PPO | Admitting: Neurology

## 2023-05-19 VITALS — BP 132/67 | HR 78 | Ht 64.0 in | Wt 164.0 lb

## 2023-05-19 DIAGNOSIS — R55 Syncope and collapse: Secondary | ICD-10-CM | POA: Diagnosis not present

## 2023-05-19 DIAGNOSIS — G44219 Episodic tension-type headache, not intractable: Secondary | ICD-10-CM

## 2023-05-19 NOTE — Patient Instructions (Signed)
 Good to see you!  Try the low dose nortriptyline 10mg : take 1 capsule every night. Let me know if any problems. Do not take with the Unisom.  Follow-up in 6 months, call for any changes.

## 2023-05-19 NOTE — Progress Notes (Signed)
 NEUROLOGY FOLLOW UP OFFICE NOTE  Dana Mcmahon [id number] 1955-08-08  HISTORY OF PRESENT ILLNESS: I had the pleasure of seeing Dana Mcmahon in follow-up in the neurology clinic on 05/19/2023.  The patient was last seen 4 months ago for syncope and headaches. She is alone in the office today. Syncopal episode in 04/2022 consistent with hypovolemic event. No further syncope since then. She was also reporting frequent headaches occurring 3-4 times a month, MRI brain no acute changes. She was given a prescription for nortriptyline but opted not to start it due to concern for potential side effects. She continues to have headaches 3 times a month, she reports pulsating on the left frontal region, radiating to the back. She denies any dizziness, vision changes, focal numbness/tingling/weakness. No falls. She continues to have sleep difficulties and takes Unisom daily, which does not help all the time.    History on Initial Assessment 06/24/2022: This is a pleasant 68 year old right-handed woman with a history of hypertension, palpitations, hyperlipidemia, DM, presenting for evaluation of syncope and headaches. On 05/08/22, she passed out at a restaurant. She had not been sleeping well and had not eaten breakfast or drank much that day. She went to church, then to a restaurant where while sitting down she started having tunnel vision with things around her looking bright around them. She alerted her husband she was not feeling well and took 5 steps then her whole body felt weak. She does not recall feeling dizzy, she slid down on the wall beside her with loss of consciousness for 2-3 minutes. She felt very weak diffusely after, no tongue bite or incontinence. She felt she could not open her eyes but was very alert when she came to, saying no to EMS. She recalls being sweaty. She denies any prior history of syncope. No staring/unresponsive episodes, gaps in time, olfactory/gustatory hallucinations, deja  vu, rising epigastric sensation, focal numbness/tingling/weakness, myoclonic jerks. She sees Cardiology and had an unremarkable echocardiogram, Zio monitor no significant arrhythmias with rare very brief episodes of SVT, episode felt due to hypovolemia. Her son had seizures. She had a normal birth and early development.  There is no history of febrile convulsions, CNS infections such as meningitis/encephalitis, significant traumatic brain injury, neurosurgical procedures.  For the past 4 years, she has had sharp headaches over the midline from front to back lasting a few seconds. They occur 4-5 times a month, she had one yesterday. Sometimes pain is on the left temporal region. She was doing a lot of work yesterday and had a headache at the end of the day. No associated nausea/vomiting, photo/phonophobia, visual obscurations. She does not take any headache medication. She can continue to function but it slows her down. She has occasional neck pain. No diplopia, dysarthria/dysphagia, bowel/bladder dysfunction. She reports difficulty sleeping with 4 hours of sleep. She feels drowsy in the daytime and has been told she snores. She has a very stressful job, she has been working as Education administrator since 2007.   I personally reviewed brain MRI without contrast done 05/2022, no acute changes, there was mild to moderate chronic microvascular disease.   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     BIOTIN PO Take 1 tablet by mouth daily.     Cholecalciferol (VITAMIN D3 PO) Take 1 capsule by mouth daily.     doxylamine, Sleep, (UNISOM) 25 MG tablet Take 25 mg by mouth at bedtime as needed for sleep.     ELDERBERRY PO Take by mouth.     fluticasone (FLONASE) 50 MCG/ACT nasal spray Place 2 sprays into both nostrils daily as needed. 16 g 5   glucose blood (ACCU-CHEK GUIDE) test strip Check blood sugar three times daily 100 each 11   Lancets (ACCU-CHEK MULTICLIX) lancets Check blood sugar three times daily 100 each 11   losartan (COZAAR) 100 MG tablet Take 1 tablet (100 mg total) by mouth daily. 90 tablet 3   metFORMIN (GLUCOPHAGE-XR) 500 MG 24 hr tablet TAKE 1 TABLET(500 MG) BY MOUTH TWICE DAILY WITH A MEAL (Patient taking differently: daily.) 180 tablet 3   metoprolol succinate (TOPROL-XL) 25 MG 24 hr tablet Take 0.5 tablets (12.5 mg total) by mouth daily. 45 tablet 2   Multiple Vitamin (MULTIVITAMIN WITH MINERALS) TABS tablet Take 1 tablet by mouth daily.     rosuvastatin (CRESTOR) 5 MG tablet TAKE 1 TABLET(5 MG) BY MOUTH 1 TIME A WEEK 13 tablet 3   vitamin E 1000 UNIT capsule Take 1,000 Units by mouth daily.     nortriptyline (PAMELOR) 10 MG capsule Take 1 capsule (10 mg total) by mouth at bedtime. (Patient not taking: Reported on 05/19/2023) 30 capsule 6   No current facility-administered medications on file prior to visit.    ALLERGIES: Allergies  Allergen Reactions   Codeine Nausea Only   Penicillins Nausea And Vomiting, Swelling and Other (See Comments)    Has patient had a PCN reaction causing immediate rash, facial/tongue/throat swelling, SOB or lightheadedness with hypotension: Yes Has patient had a PCN reaction causing severe rash involving mucus membranes or skin necrosis: No Has patient had a PCN reaction that required hospitalization:  No Has patient had a PCN reaction occurring within the last 10 years: No If all of the above answers are "NO", then may proceed with Cephalosporin use.    Latex Rash    FAMILY HISTORY: Family History  Problem Relation Age of Onset   Stroke Mother        35s, and father 5s   Hypertension Mother        and father   Stroke Brother        in 57s   Colon cancer Neg Hx    Esophageal cancer Neg Hx    Rectal cancer Neg Hx    Stomach cancer Neg Hx     SOCIAL HISTORY: Social History   Socioeconomic History   Marital status: Married    Spouse name: Not on file   Number of children: Not on file   Years of education: Not on file   Highest education level: Not on file  Occupational History   Not on file  Tobacco Use   Smoking status: Never  Smokeless tobacco: Never  Vaping Use   Vaping status: Never Used  Substance and Sexual Activity   Alcohol use: No   Drug use: No   Sexual activity: Not Currently    Partners: Male  Other Topics Concern   Not on file  Social History Narrative   Married, 4 children (daughter Charisma patient here, husband Mena Goes, son Mena Goes). 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      Hobbies: time with family, watching news, reading      Right handed         Social Drivers of Corporate investment banker Strain: Not on file  Food Insecurity: Not on file  Transportation Needs: Not on file  Physical Activity: Not on file  Stress: Not on file  Social Connections: Not on file  Intimate Partner Violence: Not on file     PHYSICAL EXAM: Vitals:   05/19/23 1515  BP: 132/67  Pulse: 78  SpO2: 96%   General: No acute distress Head:  Normocephalic/atraumatic Skin/Extremities: No rash, no edema Neurological Exam: alert and awake. No aphasia or dysarthria. Fund of knowledge is appropriate.  Attention and concentration are normal.   Cranial nerves: Pupils equal,  round. Extraocular movements intact with no nystagmus. Visual fields full.  No facial asymmetry.  Motor: Bulk and tone normal, muscle strength 5/5 throughout with no pronator drift.   Finger to nose testing intact.  Gait narrow-based and steady, able to tandem walk adequately.  Romberg negative.   IMPRESSION: This is a pleasant 68 yo RH woman with a history of hypertension, palpitations, hyperlipidemia, DM, who presented for syncope and headaches. Her neurological exam is normal. MRI brain unremarkable. No further syncopal episodes since 04/2021, likely due to hypovolemia. She continues to have an average of 3 headaches a month affecting ability to work, we discussed moving ahead with trial of low dose nortriptyline 10mg  at bedtime, discussed patient concerns. Discussed minimizing Unisom intake as well. She was advised to keep a calendar of headaches, follow-up in 6 months, call for any changes.  Thank you for allowing me to participate in her care.  Please do not hesitate to call for any questions or concerns.    Patrcia Dolly, M.D.   CC: Dr. Durene Cal

## 2023-06-14 ENCOUNTER — Ambulatory Visit: Admitting: Physician Assistant

## 2023-06-14 ENCOUNTER — Encounter: Payer: Self-pay | Admitting: Physician Assistant

## 2023-06-14 VITALS — BP 144/82 | HR 65 | Temp 98.1°F | Ht 64.0 in | Wt 164.0 lb

## 2023-06-14 DIAGNOSIS — R051 Acute cough: Secondary | ICD-10-CM | POA: Diagnosis not present

## 2023-06-14 MED ORDER — AZITHROMYCIN 250 MG PO TABS: ORAL_TABLET | ORAL | 0 refills | Status: AC

## 2023-06-14 MED ORDER — HYDROCOD POLI-CHLORPHE POLI ER 10-8 MG/5ML PO SUER: 5.0000 mL | Freq: Every evening | ORAL | 0 refills | Status: AC | PRN

## 2023-06-14 NOTE — Progress Notes (Signed)
 Patient ID: Dana Mcmahon, female    DOB: 1955-11-12, 68 y.o.   MRN: [id number]   Assessment & Plan:  Acute cough  Other orders -     Hydrocod Poli-Chlorphe Poli ER; Take 5 mLs by mouth at bedtime as needed for up to 5 days for cough.  Dispense: 70 mL; Refill: 0 -     Azithromycin ; Take 2 tablets on day 1, then 1 tablet daily on days 2 through 5  Dispense: 6 tablet; Refill: 0      Assessment & Plan Acute cough Cough and chest congestion for eight days, likely acquired from recent in-person work. Productive cough with nasal congestion. Lungs clear on examination. Previous bronchitis treated successfully with Z-Pak. No known allergies to Z-Pak. Discussed potential for bronchitis to persist for weeks if untreated. Recommended antibiotics due to symptom duration and previous response to treatment. Advised on sedative effects of Tussionex, don't drive with this. - Prescribe Z-Pak (azithromycin ) for five days. - Prescribe Tussionex or nighttime cough relief. - Recommend nasal saline spray or allergy  medication like Claritin or Zyrtec  to manage sinus symptoms. - Advise to contact Doctor Arlene Ben or myself if symptoms worsen or do not improve.      Subjective:    Chief Complaint  Patient presents with   Cough    Pt states has had cough for more than 8 days, OTC Delsym not helping patient; pt states prior last week had some chills, no other symptoms;     HPI Discussed the use of AI scribe software for clinical note transcription with the patient, who gave verbal consent to proceed.  History of Present Illness Dana Mcmahon is a 68 year old female who presents with cough and chest congestion.  She has been experiencing symptoms for about eight days, including a productive cough and chest congestion. The cough is associated with mucus production. She believes she contracted the illness after a visit to her office.  She has been using Delsym for her symptoms but  finds it ineffective and describes her condition as 'getting heavier'. She recalls a similar episode last year, diagnosed as bronchitis, for which she was treated with a Z-Pak, which she tolerated well.  No recent travel, smoking, or vaping. She works from home and is the only one in her household who is currently sick.     Past Medical History:  Diagnosis Date   Allergic rhinitis    Anxiety    Arthritis    DM type 2 (diabetes mellitus, type 2) (HCC)    Heart murmur    Hyperlipidemia    per old records, pt has refused cholesto-lowering meds   Hypertension    Neck pain, chronic    Mild DDD, no signif progression (multiple MRI's: 2001, 2003, 2005, 2007)   Obesity, Class I, BMI 30-34.9    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   Right    Past Surgical History:  Procedure Laterality Date   ARTERY BIOPSY Left 06/13/2017   Procedure: LEFT TEMPORAL ARTERY BIOPSY;  Surgeon: Jacolyn Matar, MD;  Location: [facility];  Service: General;  Laterality: Left;   COLONOSCOPY     none      Family History  Problem Relation Age of Onset   Stroke Mother        69s, and father 47s   Hypertension Mother        and father   Stroke Brother        in 42s   Colon cancer  Neg Hx    Esophageal cancer Neg Hx    Rectal cancer Neg Hx    Stomach cancer Neg Hx     Social History   Tobacco Use   Smoking status: Never   Smokeless tobacco: Never  Vaping Use   Vaping status: Never Used  Substance Use Topics   Alcohol use: No   Drug use: No     Allergies  Allergen Reactions   Codeine Nausea Only   Penicillins Nausea And Vomiting, Swelling and Other (See Comments)    Has patient had a PCN reaction causing immediate rash, facial/tongue/throat swelling, SOB or lightheadedness with hypotension: Yes Has patient had a PCN reaction causing severe rash involving mucus membranes or skin necrosis: No Has patient had a PCN reaction that required hospitalization: No Has patient had a PCN reaction occurring within  the last 10 years: No If all of the above answers are "NO", then may proceed with Cephalosporin use.    Latex Rash    Review of Systems NEGATIVE UNLESS OTHERWISE INDICATED IN HPI      Objective:     BP (!) 144/82 (BP Location: Left Arm, Patient Position: Sitting)   Pulse 65   Temp 98.1 F (36.7 C) (Temporal)   Ht 5\' 4"  (1.626 m)   Wt 164 lb (74.4 kg)   LMP  (LMP Unknown)   SpO2 98%   BMI 28.15 kg/m   Wt Readings from Last 3 Encounters:  06/14/23 164 lb (74.4 kg)  05/19/23 164 lb (74.4 kg)  01/13/23 167 lb 12.8 oz (76.1 kg)    BP Readings from Last 3 Encounters:  06/14/23 (!) 144/82  05/19/23 132/67  01/13/23 139/73     Physical Exam Vitals and nursing note reviewed.  Constitutional:      General: She is not in acute distress.    Appearance: Normal appearance. She is not ill-appearing.     Comments: Appears tired  HENT:     Head: Normocephalic.     Right Ear: Tympanic membrane, ear canal and external ear normal.     Left Ear: Tympanic membrane, ear canal and external ear normal.     Nose: Congestion present.     Mouth/Throat:     Mouth: Mucous membranes are moist.     Pharynx: No oropharyngeal exudate or posterior oropharyngeal erythema.  Eyes:     Extraocular Movements: Extraocular movements intact.     Conjunctiva/sclera: Conjunctivae normal.     Pupils: Pupils are equal, round, and reactive to light.  Cardiovascular:     Rate and Rhythm: Normal rate and regular rhythm.     Pulses: Normal pulses.     Heart sounds: Normal heart sounds. No murmur heard. Pulmonary:     Effort: Pulmonary effort is normal. No respiratory distress.     Breath sounds: Normal breath sounds. No wheezing.  Musculoskeletal:     Cervical back: Normal range of motion.  Skin:    General: Skin is warm.  Neurological:     Mental Status: She is alert and oriented to person, place, and time.  Psychiatric:        Mood and Affect: Mood normal.        Behavior: Behavior normal.              Geralene Afshar M Zian Delair, PA-C

## 2023-06-21 ENCOUNTER — Telehealth: Payer: Self-pay | Admitting: Family Medicine

## 2023-06-21 NOTE — Telephone Encounter (Signed)
 Labs-we ordered labs 11/15/2022 and patient has still not come back for these-1 of these test is the microalbumin creatinine ratio test that we discovered we had a lab error on in prior years and we may have underestimated the level of kidney damage as a result-she really needs to come back for these labs-please call and encourage her-also would be good to go ahead and schedule a visit since we have not seen her since that time as well  She is not on MyChart-I tried to send this to her through MyChart

## 2023-06-21 NOTE — Telephone Encounter (Signed)
 Please call and schedule pt for 6 month f/u  along with labs per Dr. Arlene Ben message below.

## 2023-06-22 ENCOUNTER — Other Ambulatory Visit

## 2023-06-22 ENCOUNTER — Ambulatory Visit: Admitting: Family Medicine

## 2023-06-30 ENCOUNTER — Other Ambulatory Visit

## 2023-06-30 ENCOUNTER — Other Ambulatory Visit: Payer: Self-pay | Admitting: Emergency Medicine

## 2023-06-30 ENCOUNTER — Encounter: Payer: Self-pay | Admitting: Family Medicine

## 2023-06-30 ENCOUNTER — Ambulatory Visit (INDEPENDENT_AMBULATORY_CARE_PROVIDER_SITE_OTHER): Admitting: Family Medicine

## 2023-06-30 VITALS — BP 128/76 | HR 62 | Temp 98.1°F | Resp 16 | Ht 63.0 in | Wt 164.4 lb

## 2023-06-30 DIAGNOSIS — E785 Hyperlipidemia, unspecified: Secondary | ICD-10-CM

## 2023-06-30 DIAGNOSIS — I10 Essential (primary) hypertension: Secondary | ICD-10-CM

## 2023-06-30 DIAGNOSIS — Z7984 Long term (current) use of oral hypoglycemic drugs: Secondary | ICD-10-CM

## 2023-06-30 DIAGNOSIS — R519 Headache, unspecified: Secondary | ICD-10-CM | POA: Diagnosis not present

## 2023-06-30 DIAGNOSIS — E1169 Type 2 diabetes mellitus with other specified complication: Secondary | ICD-10-CM | POA: Diagnosis not present

## 2023-06-30 DIAGNOSIS — E119 Type 2 diabetes mellitus without complications: Secondary | ICD-10-CM

## 2023-06-30 LAB — CBC WITH DIFFERENTIAL/PLATELET
Basophils Absolute: 0.1 10*3/uL (ref 0.0–0.1)
Basophils Relative: 1.2 % (ref 0.0–3.0)
Eosinophils Absolute: 0.1 10*3/uL (ref 0.0–0.7)
Eosinophils Relative: 3.4 % (ref 0.0–5.0)
HCT: 40.8 % (ref 36.0–46.0)
Hemoglobin: 13.8 g/dL (ref 12.0–15.0)
Lymphocytes Relative: 39.3 % (ref 12.0–46.0)
Lymphs Abs: 1.7 10*3/uL (ref 0.7–4.0)
MCHC: 33.8 g/dL (ref 30.0–36.0)
MCV: 85.8 fl (ref 78.0–100.0)
Monocytes Absolute: 0.3 10*3/uL (ref 0.1–1.0)
Monocytes Relative: 7.5 % (ref 3.0–12.0)
Neutro Abs: 2.1 10*3/uL (ref 1.4–7.7)
Neutrophils Relative %: 48.6 % (ref 43.0–77.0)
Platelets: 297 10*3/uL (ref 150.0–400.0)
RBC: 4.75 Mil/uL (ref 3.87–5.11)
RDW: 14.1 % (ref 11.5–15.5)
WBC: 4.2 10*3/uL (ref 4.0–10.5)

## 2023-06-30 LAB — LIPID PANEL
Cholesterol: 256 mg/dL — ABNORMAL HIGH (ref 0–200)
HDL: 42.4 mg/dL (ref >39.00)
NonHDL: 213.45
Total CHOL/HDL Ratio: 6
Triglycerides: 583 mg/dL — ABNORMAL HIGH (ref 0.0–149.0)
VLDL: 116.6 mg/dL — ABNORMAL HIGH (ref 0.0–40.0)

## 2023-06-30 LAB — COMPREHENSIVE METABOLIC PANEL WITH GFR
ALT: 16 U/L (ref 0–35)
AST: 14 U/L (ref 0–37)
Albumin: 4.2 g/dL (ref 3.5–5.2)
Alkaline Phosphatase: 72 U/L (ref 39–117)
BUN: 17 mg/dL (ref 6–23)
CO2: 27 meq/L (ref 19–32)
Calcium: 9.3 mg/dL (ref 8.4–10.5)
Chloride: 102 meq/L (ref 96–112)
Creatinine, Ser: 0.95 mg/dL (ref 0.40–1.20)
GFR: 61.58 mL/min (ref >60.00)
Glucose, Bld: 145 mg/dL — ABNORMAL HIGH (ref 70–99)
Potassium: 3.9 meq/L (ref 3.5–5.1)
Sodium: 139 meq/L (ref 135–145)
Total Bilirubin: 0.4 mg/dL (ref 0.2–1.2)
Total Protein: 7.1 g/dL (ref 6.0–8.3)

## 2023-06-30 LAB — LDL CHOLESTEROL, DIRECT: Direct LDL: 96 mg/dL

## 2023-06-30 LAB — HEMOGLOBIN A1C: Hgb A1c MFr Bld: 7.9 % — ABNORMAL HIGH (ref 4.6–6.5)

## 2023-06-30 MED ORDER — ROSUVASTATIN CALCIUM 5 MG PO TABS: ORAL_TABLET | ORAL | 3 refills | Status: DC

## 2023-06-30 NOTE — Addendum Note (Signed)
 Addended by: Jullie Oiler D on: 06/30/2023 02:51 PM   Modules accepted: Orders

## 2023-06-30 NOTE — Patient Instructions (Addendum)
-   offered sports medicine consult- wants to hold off for now- she can call me if changes her mind - will trial double bandaid splint for her finger and can trial counterforce brace for her elbow -also give home exercise program for tennis elbow -I'm open to writing letter for ergonomic mouse if she gets details of what she needs specifically for that  I want you to do the exercise 3x a week for a month then once a week for another month. Stop any exercise that causes more than 1-2/10 pain increase. If not doing better within 1-2 months let us  refer you to sports medicine  a1c improved- encouraged exercise 150 minutes a week as medicine- on diet front feels can cut down on carbohydrates like bread- she wants to maintain metformin  and work on lifestyle- also discussed januvia pill, Mounjaro shot, jardiance Pill (also good for kidneys) she wants to think these over and let me know  Recommended follow up: Return in about 14 weeks (around 10/06/2023) for followup or sooner if needed.Schedule b4 you leave. and another 14 weeks later schedule physical.

## 2023-06-30 NOTE — Progress Notes (Signed)
 Phone [phone] In person visit   Subjective:   Dana Mcmahon is a 68 y.o. year old very pleasant female patient who presents for/with See problem oriented charting Chief Complaint  Patient presents with   Follow-up   Past Medical History-  Patient Active Problem List   Diagnosis Date Noted   Diabetes mellitus type II, controlled (HCC) 05/10/2013    Priority: High   Headache, unspecified headache type 06/03/2017    Priority: Medium    History of adenomatous polyp of colon 07/25/2016    Priority: Medium    Upper airway cough syndrome 01/22/2016    Priority: Medium    Hyperlipidemia associated with type 2 diabetes mellitus (HCC)     Priority: Medium    Primary hypertension 05/10/2013    Priority: Medium    Palpitations 05/10/2013    Priority: Medium    COVID-19 vaccine series declined 03/16/2020    Priority: Low   Osteoarthritis, hand 12/16/2014    Priority: Low   Allergic rhinitis 07/24/2014    Priority: Low   Obesity, Class I, BMI 30-34.9     Priority: Low   Dizzy spells 08/08/2013    Priority: Low    Medications- reviewed and updated Current Outpatient Medications  Medication Sig Dispense Refill   amLODipine  (NORVASC ) 2.5 MG tablet TAKE 1 TABLET(2.5 MG) BY MOUTH DAILY 90 tablet 3   Ascorbic Acid (VITAMIN C PO) Take 1 tablet by mouth daily.     BIOTIN PO Take 1 tablet by mouth daily.     Cholecalciferol (VITAMIN D3 PO) Take 1 capsule by mouth daily.     doxylamine, Sleep, (UNISOM) 25 MG tablet Take 25 mg by mouth at bedtime as needed for sleep.     ELDERBERRY PO Take by mouth.     fluticasone  (FLONASE ) 50 MCG/ACT nasal spray Place 2 sprays into both nostrils daily as needed. 16 g 5   glucose blood (ACCU-CHEK GUIDE) test strip Check blood sugar three times daily 100 each 11   Lancets (ACCU-CHEK MULTICLIX) lancets Check blood sugar three times daily 100 each 11   losartan  (COZAAR ) 100 MG tablet Take 1 tablet (100 mg total) by mouth daily. 90 tablet 3    metFORMIN  (GLUCOPHAGE -XR) 500 MG 24 hr tablet TAKE 1 TABLET(500 MG) BY MOUTH TWICE DAILY WITH A MEAL (Patient taking differently: daily.) 180 tablet 3   metoprolol  succinate (TOPROL -XL) 25 MG 24 hr tablet Take 0.5 tablets (12.5 mg total) by mouth daily. 45 tablet 2   Multiple Vitamin (MULTIVITAMIN WITH MINERALS) TABS tablet Take 1 tablet by mouth daily.     nortriptyline  (PAMELOR ) 10 MG capsule Take 1 capsule (10 mg total) by mouth at bedtime. (Patient not taking: Reported on 05/19/2023) 30 capsule 6   rosuvastatin  (CRESTOR ) 5 MG tablet TAKE 1 TABLET(5 MG) BY MOUTH 1 TIME A WEEK 13 tablet 3   vitamin E 1000 UNIT capsule Take 1,000 Units by mouth daily.     No current facility-administered medications for this visit.     Objective:  BP 128/76   Pulse 62   Temp 98.1 F (36.7 C) (Temporal)   Resp 16   Ht 5\' 3"  (1.6 m)   Wt 164 lb 6.4 oz (74.6 kg)   LMP  (LMP Unknown)   SpO2 96%   BMI 29.12 kg/m  Gen: NAD, resting comfortably CV: RRR no murmurs rubs or gallops Lungs: CTAB no crackles, wheeze, rhonchi Ext: no edema Skin: warm, dry Neuro: grossly normal, moves all extremities MSK:  Pain with resisted pronation of wrist at lateral epicondyle-also has pain with direct palpation of this area even without resisted pronation.  Triggering of right third finger noted-mild hypertrophy at third PIP    Assessment and Plan    #social update- newest grandson and another on the way  #Right 3rd finger pain- noted some triggering in this- also feels like proximal interphalangeal joint mildly swollen. Started 2 weeks ago. Also some mild discomfort up to right elbow- also tender along lateral epicondyle consistent with tennis elbow. Bothers her with cooking- can't lift Postural Orthostatic Tachycardia Syndrome (POTS) the same way.  - offered sports medicine consult- wants to hold off for now- she can call me if changes her mind.  Does have some swelling of the PIP that I do not fully understand the  reasons i recommended referral-she still wants to hold off today - will trial double bandaid splint for her finger and can trial counterforce brace for her elbow -also give home exercise program for tennis elbow -I'm open to writing letter for ergonomic mouse if she gets details of what she needs specifically for that  # Diabetes S: compliant with metformin  500 mg daily  Lab Results  Component Value Date   HGBA1C 7.9 (H) 06/30/2023  A/P:a1c improved- encouraged exercise 150 minutes a week as medicine- on diet front feels can cut down on carbohydrates like bread- she wants to maintain metformin  and work on lifestyle- also discussed januvia pill, Mounjaro shot, jardiance Pill (also good for kidneys) she wants to think these over and let me know  I discussed the microalbumin to creatinine lab error with patient that occurred with [facility] labs.  Essentially the ratio was off by a factor of 10.  In this patient's individual case 7 years ago her level was only 19 adjusted- but last year jumped to 133 adjusted- she is already on losartan  thankfully but we need better a1c control- additionally- we discussed possible jardiance but she would really have to think about that she says   Hyperlipidemia  S: Compliant with  rosuvastatin  5 mg once a week - has been out for 2 weeks Lab Results  Component Value Date   CHOL 256 (H) 06/30/2023   HDL 42.40 06/30/2023   LDLDIRECT 96.0 06/30/2023   TRIG (H) 06/30/2023    583.0 Triglyceride is over 400; calculations on Lipids are invalid.   CHOLHDL 6 06/30/2023  A/P: LDL at least below 100-triglycerides very high but A1c being high could contribute-discussed working on healthy eating/regular exercise/weight loss to bring this down-consider rechecking triglycerides once A1c improved  Hypertension S:compliant with amlodipine  2.5 mg once a day, losartan  100 mg, and metoprolol  25mg  extended release.    -Stopped HCTZ due to potentially causing gout.  Stopped ACE  inhibitor in the past due to cough but later restarted ARB. BP Readings from Last 3 Encounters:  06/30/23 128/76  06/14/23 (!) 144/82  05/19/23 132/67  A/P:  stable- continue current medicines    #Headache- now seeing Dr. Ty Gales with trial nortriptyline  10 mg- took one time but didn't sleep well not taking her unisom. She wants to hold off for now- still with work related stress.   Recommended follow up: Return in about 14 weeks (around 10/06/2023) for followup or sooner if needed.Schedule b4 you leave. and another 14 weeks later schedule physical. Future Appointments  Date Time Provider Department Center  10/10/2023  3:20 PM Almira Jaeger, MD [facility]  12/22/2023  4:00 PM Jhonny Moss, MD  [facility] None  01/15/2024  3:20 PM Marshal Schrecengost, Saverio Curling, MD [facility]    Lab/Order associations:   ICD-10-CM   1. Controlled type 2 diabetes mellitus without complication, without long-term current use of insulin (HCC)  E11.9 Lipid panel    Urine Microalbumin w/creat. ratio    Hemoglobin A1c    Comprehensive metabolic panel    CBC with Differential/Platelet    2. Hyperlipidemia associated with type 2 diabetes mellitus (HCC)  E11.69 Lipid panel   E78.5       Meds ordered this encounter  Medications   rosuvastatin  (CRESTOR ) 5 MG tablet    Sig: TAKE 1 TABLET(5 MG) BY MOUTH 1 TIME A WEEK    Dispense:  13 tablet    Refill:  3    Return precautions advised.  Clarisa Crooked, MD

## 2023-07-03 ENCOUNTER — Telehealth: Payer: Self-pay | Admitting: Family Medicine

## 2023-07-03 ENCOUNTER — Telehealth: Payer: Self-pay | Admitting: Cardiovascular Disease

## 2023-07-03 MED ORDER — METOPROLOL SUCCINATE ER 25 MG PO TB24: 12.5000 mg | ORAL_TABLET | Freq: Every day | ORAL | 1 refills | Status: DC

## 2023-07-03 NOTE — Telephone Encounter (Signed)
*  STAT* If patient is at the pharmacy, call can be transferred to refill team.   1. Which medications need to be refilled? (please list name of each medication and dose if known) metoprolol  succinate (TOPROL -XL) 25 MG 24 hr tablet    2. Would you like to learn more about the convenience, safety, & potential cost savings by using the [facility]?      3. Are you open to using the [facility] (Type [facility]. ).   4. Which pharmacy/location (including street and city if local pharmacy) is medication to be sent to? [id number] - [place], [place] - [address]    5. Do they need a 30 day or 90 day supply?  90 day Patient only had 4 pills left.

## 2023-07-03 NOTE — Telephone Encounter (Signed)
 Returned pt call and phone continued to ring again.

## 2023-07-03 NOTE — Telephone Encounter (Signed)
 Refill for Toprol  has been sent to [facility], per pt's request.

## 2023-07-03 NOTE — Telephone Encounter (Signed)
Patient returned call. Requests to be called. 

## 2023-07-05 ENCOUNTER — Ambulatory Visit: Payer: Self-pay

## 2023-07-27 ENCOUNTER — Telehealth: Payer: Self-pay

## 2023-07-27 NOTE — Telephone Encounter (Signed)
 Reached out to pt and phone just rang, unable to leave vm.  Copied from CRM #[id number]. Topic: General - Other >> Jul 03, 2023  1:40 PM Howard Macho wrote: Reason for CRM: patient returning a call to Alaria Oconnor at the office

## 2023-08-10 ENCOUNTER — Telehealth: Payer: Self-pay | Admitting: Neurology

## 2023-08-10 ENCOUNTER — Telehealth: Payer: Self-pay | Admitting: Family Medicine

## 2023-08-10 DIAGNOSIS — M79601 Pain in right arm: Secondary | ICD-10-CM

## 2023-08-10 NOTE — Telephone Encounter (Signed)
**   per pt needs to pick up ppw by tomorrow 08/11/23   Patient dropped off document work accomodation forms , to be filled out by provider. Patient requested to send it back via Call Patient to pick up within ASAP. Document is located in providers tray at front office.Please advise at Mobile [phone] (mobile)

## 2023-08-10 NOTE — Telephone Encounter (Signed)
 Pt. Will call to pay over the phone, would like to pickup FMLA put inbox

## 2023-08-14 NOTE — Telephone Encounter (Signed)
 Done, thanks

## 2023-08-15 NOTE — Telephone Encounter (Signed)
 Pt needs to pay so we can fax

## 2023-08-18 NOTE — Telephone Encounter (Signed)
 Paperwork at front desk for pt to pay and pick up ,

## 2023-08-28 NOTE — Telephone Encounter (Signed)
 Copied from CRM [id number]. Topic: General - Other >> Aug 28, 2023  9:50 AM Delon DASEN wrote: Reason for CRM: Patient checking to see if her accommodation paperwork will be ready for pick up tomorrow afternoon- please call  [phone]

## 2023-08-28 NOTE — Telephone Encounter (Signed)
 Referral has been placed.

## 2023-08-28 NOTE — Telephone Encounter (Signed)
 Paperwork is extensive-I had mentioned I would be willing to write a letter for an ergonomic mouse only.  For this level of accommodation we need more extensive evaluation-recommend sports medicine or orthopedics consult as the initial step.  I can use that information to help fill out her paperwork.  In addition the paperwork she submitted only 2 pages are from medical professional and the rest she needs to fill out-if she would like to come back by and fill those out after sports medicine or orthopedic evaluation I can complete the rest for her.

## 2023-08-28 NOTE — Telephone Encounter (Signed)
 Called and spoke with pt below message given and she would like to see ortho. She will check with her daughter and get the name of the orthopedist and give me a call back so I can place the referral.

## 2023-08-28 NOTE — Telephone Encounter (Signed)
 Form in your to sign folder. Pt states that this is for her right hand and elbow and that you told her at her visit on 05/09 if she needed accomodations for work to let you know. She has not missed any dates at work but wants this in place for future need.

## 2023-08-28 NOTE — Telephone Encounter (Signed)
 Patient returned call. States the Provider she wants to be referred to is Morene Mace

## 2023-09-08 ENCOUNTER — Telehealth: Payer: Self-pay | Admitting: Cardiovascular Disease

## 2023-09-08 MED ORDER — LOSARTAN POTASSIUM 100 MG PO TABS: 100.0000 mg | ORAL_TABLET | Freq: Every day | ORAL | 0 refills | Status: DC

## 2023-09-08 NOTE — Telephone Encounter (Signed)
*  STAT* If patient is at the pharmacy, call can be transferred to refill team.   1. Which medications need to be refilled? (please list name of each medication and dose if known)   losartan  (COZAAR ) 100 MG tablet     2. Would you like to learn more about the convenience, safety, & potential cost savings by using the [facility]? No   3. Are you open to using the [facility] (Type [facility].)  No   4. Which pharmacy/location (including street and city if local pharmacy) is medication to be sent to? [id number] - [place],  - [address]    5. Do they need a 30 day or 90 day supply? 90 day

## 2023-09-08 NOTE — Telephone Encounter (Signed)
 Pt's medication was sent to pt's pharmacy as requested. Confirmation received.

## 2023-09-13 NOTE — Progress Notes (Unsigned)
    Dana Mcmahon D.[name] [address] [place] [address] Phone: [phone]   Assessment and Plan:     There are no diagnoses linked to this encounter.  ***   Pertinent previous records reviewed include ***    Follow Up: ***     Subjective:   I, Dana Mcmahon, am serving as a Neurosurgeon for Doctor Morene Mace  Chief Complaint: right arm pain   HPI:   09/14/2023 Patient is a 68 year old female with right arm pain. Patient states   Relevant Historical Information: ***  Additional pertinent review of systems negative.   Current Outpatient Medications:    amLODipine  (NORVASC ) 2.5 MG tablet, TAKE 1 TABLET(2.5 MG) BY MOUTH DAILY, Disp: 90 tablet, Rfl: 3   Ascorbic Acid (VITAMIN C PO), Take 1 tablet by mouth daily., Disp: , Rfl:    BIOTIN PO, Take 1 tablet by mouth daily., Disp: , Rfl:    Cholecalciferol (VITAMIN D3 PO), Take 1 capsule by mouth daily., Disp: , Rfl:    doxylamine, Sleep, (UNISOM) 25 MG tablet, Take 25 mg by mouth at bedtime as needed for sleep., Disp: , Rfl:    ELDERBERRY PO, Take by mouth., Disp: , Rfl:    fluticasone  (FLONASE ) 50 MCG/ACT nasal spray, Place 2 sprays into both nostrils daily as needed., Disp: 16 g, Rfl: 5   glucose blood (ACCU-CHEK GUIDE) test strip, Check blood sugar three times daily, Disp: 100 each, Rfl: 11   Lancets (ACCU-CHEK MULTICLIX) lancets, Check blood sugar three times daily, Disp: 100 each, Rfl: 11   losartan  (COZAAR ) 100 MG tablet, Take 1 tablet (100 mg total) by mouth daily., Disp: 90 tablet, Rfl: 0   metFORMIN  (GLUCOPHAGE -XR) 500 MG 24 hr tablet, TAKE 1 TABLET(500 MG) BY MOUTH TWICE DAILY WITH A MEAL (Patient taking differently: daily.), Disp: 180 tablet, Rfl: 3   metoprolol  succinate (TOPROL -XL) 25 MG 24 hr tablet, Take 0.5 tablets (12.5 mg total) by mouth daily., Disp: 45 tablet, Rfl: 1   Multiple Vitamin (MULTIVITAMIN WITH MINERALS) TABS tablet, Take 1 tablet by mouth daily., Disp:  , Rfl:    nortriptyline  (PAMELOR ) 10 MG capsule, Take 1 capsule (10 mg total) by mouth at bedtime. (Patient not taking: Reported on 05/19/2023), Disp: 30 capsule, Rfl: 6   rosuvastatin  (CRESTOR ) 5 MG tablet, TAKE 1 TABLET(5 MG) BY MOUTH 1 TIME A WEEK, Disp: 13 tablet, Rfl: 3   vitamin E 1000 UNIT capsule, Take 1,000 Units by mouth daily., Disp: , Rfl:    Objective:     There were no vitals filed for this visit.    There is no height or weight on file to calculate BMI.    Physical Exam:    ***   Electronically signed by:  Odis Mace D.[name] 7:42 AM 09/13/23

## 2023-09-14 ENCOUNTER — Ambulatory Visit: Admitting: Sports Medicine

## 2023-09-14 ENCOUNTER — Ambulatory Visit (INDEPENDENT_AMBULATORY_CARE_PROVIDER_SITE_OTHER)

## 2023-09-14 VITALS — HR 69 | Ht 63.0 in | Wt 164.0 lb

## 2023-09-14 DIAGNOSIS — M79641 Pain in right hand: Secondary | ICD-10-CM

## 2023-09-14 DIAGNOSIS — M25521 Pain in right elbow: Secondary | ICD-10-CM | POA: Diagnosis not present

## 2023-09-14 DIAGNOSIS — M7711 Lateral epicondylitis, right elbow: Secondary | ICD-10-CM | POA: Diagnosis not present

## 2023-09-14 DIAGNOSIS — M7701 Medial epicondylitis, right elbow: Secondary | ICD-10-CM | POA: Diagnosis not present

## 2023-09-14 MED ORDER — MELOXICAM 15 MG PO TABS: 15.0000 mg | ORAL_TABLET | Freq: Every day | ORAL | 0 refills | Status: AC

## 2023-09-14 NOTE — Patient Instructions (Addendum)
-   Start meloxicam  15 mg daily x2 weeks.  If still having pain after 2 weeks, complete 3rd-week of NSAID. May use remaining NSAID as needed once daily for pain control.  Do not to use additional over-the-counter NSAIDs (ibuprofen, naproxen, Advil, Aleve, etc.) while taking prescription NSAIDs.  May use Tylenol  [phone] mg 2 to 3 times a day for breakthrough pain. Recommend getting a wrist brace with no thumb from [facility] or similar  Elbow HEP  PT referral  6 week follow up

## 2023-09-22 ENCOUNTER — Ambulatory Visit: Payer: Self-pay | Admitting: Sports Medicine

## 2023-09-26 NOTE — Procedures (Signed)
 Dana Mcmahon

## 2023-09-30 ENCOUNTER — Other Ambulatory Visit: Payer: Self-pay

## 2023-09-30 ENCOUNTER — Ambulatory Visit: Attending: Sports Medicine

## 2023-09-30 DIAGNOSIS — M7711 Lateral epicondylitis, right elbow: Secondary | ICD-10-CM | POA: Diagnosis not present

## 2023-09-30 DIAGNOSIS — M25521 Pain in right elbow: Secondary | ICD-10-CM | POA: Diagnosis present

## 2023-09-30 DIAGNOSIS — M7701 Medial epicondylitis, right elbow: Secondary | ICD-10-CM | POA: Diagnosis not present

## 2023-09-30 DIAGNOSIS — M79641 Pain in right hand: Secondary | ICD-10-CM | POA: Insufficient documentation

## 2023-09-30 NOTE — Therapy (Signed)
 OUTPATIENT PHYSICAL THERAPY EVALUATION NOTE  Patient Name: Dana Mcmahon MRN: [id number] DOB:1955/04/05, 68 y.o., female Today's Date: 09/30/2023  END OF SESSION:   Visit Number 1 Number of Visits 7 Date for PT re-eval 11/11/2023 Authorization Type [employer] PT start time 1030 PT stop time 1110 PT time calculation (min) 40 min    Past Medical History:  Diagnosis Date   Allergic rhinitis    Anxiety    Arthritis    DM type 2 (diabetes mellitus, type 2) (HCC)    Heart murmur    Hyperlipidemia    per old records, pt has refused cholesto-lowering meds   Hypertension    Neck pain, chronic    Mild DDD, no signif progression (multiple MRI's: 2001, 2003, 2005, 2007)   Obesity, Class I, BMI 30-34.9    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   Right   Past Surgical History:  Procedure Laterality Date   ARTERY BIOPSY Left 06/13/2017   Procedure: LEFT TEMPORAL ARTERY BIOPSY;  Surgeon: Gladis Cough, MD;  Location: [facility];  Service: General;  Laterality: Left;   COLONOSCOPY     none     Patient Active Problem List   Diagnosis Date Noted   COVID-19 vaccine series declined 03/16/2020   Headache, unspecified headache type 06/03/2017   History of adenomatous polyp of colon 07/25/2016   Upper airway cough syndrome 01/22/2016   Osteoarthritis, hand 12/16/2014   Allergic rhinitis 07/24/2014   Hyperlipidemia associated with type 2 diabetes mellitus (HCC)    Obesity, Class I, BMI 30-34.9    Dizzy spells 08/08/2013   Diabetes mellitus type II, controlled (HCC) 05/10/2013   Primary hypertension 05/10/2013   Palpitations 05/10/2013    PCP: Katrinka Garnette KIDD, MD  REFERRING PROVIDER: Leonce Katz, DO  REFERRING DIAG:  Right elbow pain [M25.521], Right hand pain [M79.641], Medial epicondylitis of right elbow [M77.01], Lateral epicondylitis of right elbow [M77.11]   THERAPY DIAG:  Pain in right elbow  Pain in right hand  Rationale for Evaluation and Treatment:  Rehabilitation  ONSET DATE: 6 months   SUBJECTIVE:                                                                                                                                                                                      SUBJECTIVE STATEMENT: Patient reports to PT with R elbow and UE pain that has been present for at least 6 months. She notices the pain in her elbow after about 2 hours of activity. She also has R hand and wrist pain. Her job does require frequent typing and she has begun to utilizes some ergonomic updates to her desk to alleviate  symptoms.   Hand dominance: Left  PERTINENT HISTORY: Relevant PMHx includes anxiety, type 2 DM, heart murmur, HTN, chronic neck pain with mild DDD, HA, hand OA, Obesity  PAIN:  Are you having pain?  Yes: NPRS scale: moderate Pain location: R elbow (medial and lateral aspect), R wrist/hand  Pain description: aching, stiffness, (a few time numbness in fingers) Aggravating factors: prolonged typing, end range flexion/extension, golfing, weight lifting Relieving factors: rest, heat   PRECAUTIONS: None  RED FLAGS: None   WEIGHT BEARING RESTRICTIONS: No  FALLS:  Has patient fallen in last 6 months? 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  DIAGNOSTIC FINDINGS:  Per referring provider: X-ray obtained in clinic. My interpretation: No acute fracture or dislocation. Cortical spurring over medial and lateral epicondyle. Mild degenerative changes along medial elbow. Mild degenerative changes at first MCP and [facility]   PATIENT SURVEYS:  Quick Dash:  QUICK DASH  Please rate your  ability do the following activities in the last week by selecting the number below the appropriate response.   Activities Rating  Open a tight or new jar.  3 = Moderate difficulty  Do heavy household chores (e.g., wash walls, floors). 2 = Mild difficulty  Carry a shopping bag or briefcase 2 = Mild difficulty  Wash your back. 3 = Moderate difficulty  Use a knife to cut food. 1 = No difficulty   Recreational activities in which you take some force or impact through your arm, shoulder or hand (e.g., golf, hammering, tennis, etc.). 4 = Severe difficulty  During the past week, to what extent has your arm, shoulder or hand problem interfered with your normal social activities with family, friends, neighbors or groups?  3 = Moderately  During the past week, were you limited in your work or other regular daily activities as a result of your arm, shoulder or hand problem? 3 = Moderately limited  Rate the severity of the following symptoms in the last week: Arm, Shoulder, or hand pain. 4 = Severe  Rate the severity of the following symptoms in the last week: Tingling (pins and needles) in your arm, shoulder or hand. 2 = Mild  During the past week, how much difficulty have you had sleeping because of the pain in your arm, shoulder or hand?  3 = Moderate difficulty   (A QuickDASH score may not be calculated if there is greater than 1 missing item.)  Quick Dash Disability/Symptom Score:= 43.2%  Minimally Clinically Important Difference (MCID): 15-20 points  (Franchignoni, F. et al. (2013). Minimally clinically important difference of the disabilities of the arm, shoulder, and hand outcome measures (DASH) and its shortened version (Quick DASH). Journal of Orthopaedic & Sports Physical Therapy, 44(1), 30-39)   COGNITION: Overall cognitive status: Within functional limits for tasks assessed    POSTURE: WNL  UPPER EXTREMITY ROM: Elbow WFL BIL    UPPER EXTREMITY MMT:  MMT Right eval Left eval   Shoulder flexion    Shoulder extension    Shoulder abduction    Shoulder adduction    Shoulder internal rotation    Shoulder external rotation    Middle trapezius    Lower  trapezius    Elbow flexion 4 5  Elbow extension 4- 5  Wrist flexion    Wrist extension    Wrist ulnar deviation    Wrist radial deviation    Wrist pronation    Wrist supination    Grip strength (lbs)    (Blank rows = not tested)   PALPATION:  Moderate tenderness to palpable with mild swelling along medial and lateral aspects of R elbow                                                                                                                             TREATMENT DATE:   [facility] Adult PT Treatment:                                                DATE: 09/30/2023  Initial evaluation: see patient education and home exercise program as noted below     PATIENT EDUCATION: Education details: reviewed initial home exercise program; discussion of POC, prognosis and goals for skilled PT; discussion of ergonomic recommendations to decrease exacerbation of symptoms with work activities  Person educated: Patient Education method: Programmer, multimedia, Facilities manager, and Handouts Education comprehension: verbalized understanding, returned demonstration, and needs further education  HOME EXERCISE PROGRAM: Access Code: [email or web address] URL: [email or web address] Date: 10/06/2023 Prepared by: Marko Molt  Exercises - Seated Isometric Elbow Flexion  - 2 x daily - 7 x weekly - 1 sets - 10 reps - 3-5 sec hold - Seated Isometric Elbow Extension  - 2 x daily - 7 x weekly - 1 sets - 10 reps - 3-5 sec hold  Patient Education - Office Posture - Ice - Heat  ASSESSMENT:  CLINICAL IMPRESSION: Jniyah is a 68 y.o. female who was seen today for physical therapy evaluation and treatment for persistent R elbow pain at medial and lateral aspects, which appears consistent with referring diagnoses of BIL medial and lateral  epicondylitis. She is demonstrating full elbow AROM, decreased R elbow flexion and extension strength and moderate tenderness to palpation. She has related pain and difficulty with repetitive UE motions, including typing at work. She requires skilled PT services at this time to address relevant deficits and improve overall function.     OBJECTIVE IMPAIRMENTS: decreased activity tolerance, decreased strength, increased edema, impaired UE functional use, postural dysfunction, and pain.   ACTIVITY LIMITATIONS: carrying, lifting, and hygiene/grooming  PARTICIPATION LIMITATIONS: meal prep, cleaning, laundry, driving, community activity, and occupation  PERSONAL FACTORS: Age, Profession, Time since onset of injury/illness/exacerbation, and 3+ comorbidities: Relevant PMHx includes anxiety, type 2 DM, heart murmur, HTN, chronic neck pain with mild DDD, HA, hand OA, Obesity are also affecting patient's functional outcome.   REHAB POTENTIAL: Fair    CLINICAL DECISION MAKING: Stable/uncomplicated  EVALUATION COMPLEXITY: Low   GOALS: Goals reviewed with patient? YES  SHORT TERM GOALS: Target  date: 10/22/2023    Patient will be independent with initial home program at least 3 days/week.  Baseline: provided at eval Goal Status: INITIAL   2.  Patient will demonstrate improved R elbow flexion and extension MMT to at least 4+/5 Baseline: see objective measures Goal Status: INITIAL    LONG TERM GOALS: Target date: 11/11/2023    Patient will report improved overall functional ability with Quick DASH score of 30.  Baseline: 43.2% Goal Status: INITIAL    2.  Patient will demonstrate at least 50lb grip strength bilaterally, with minimal-to-no elbow pain. Baseline: deferred for f/u visit Goal status: INITIAL  3.  Patient will demonstrate improved tolerance of work-related activities including typing throughout the day, using necessary ergonomic adjustments, with no more than 3/10 pain at end of  day. Baseline: Patient has moderate pain with work tasks, lacking proper desk set up Goal status: INITIAL  4.  Patient will demonstrate 5/5 wrist flexion/extension strength.  Baseline: deferred; limited by pain  Goal status: INITIAL    PLAN:  PT FREQUENCY: 1-2x/week  PT DURATION: 8 weeks  PLANNED INTERVENTIONS: 97164- PT Re-evaluation, 97750- Physical Performance Testing, 97110-Therapeutic exercises, 97530- Therapeutic activity, [id number]- Neuromuscular re-education, 97535- Self Care, 02859- Manual therapy, G0283- Electrical stimulation (unattended), [phone]- Ionotophoresis 4mg /ml Dexamethasone , 79439 (1-2 muscles), 20561 (3+ muscles)- Dry Needling, Patient/Family education, Taping, Joint mobilization, Cryotherapy, and Moist heat  For all possible CPT codes, reference the Planned Interventions line above.     Check all conditions that are expected to impact treatment: {Conditions expected to impact treatment:Musculoskeletal disorders and Psychological or psychiatric disorders   If treatment provided at initial evaluation, no treatment charged due to lack of authorization.       PLAN FOR NEXT SESSION: assess grip strength BIL, assess wrist flexion/extension strength, updated HEP as indicated, begin manual therapy/STM/joint mob as indicated, distal UE strengthening include wrist, and grip strength, mobilization with movement, isometrics, consider TPDN if indicated   Marko Molt, PT, DPT  10/06/2023 8:38 AM

## 2023-10-07 ENCOUNTER — Ambulatory Visit: Payer: Self-pay

## 2023-10-07 DIAGNOSIS — M25521 Pain in right elbow: Secondary | ICD-10-CM | POA: Diagnosis not present

## 2023-10-07 DIAGNOSIS — M79641 Pain in right hand: Secondary | ICD-10-CM

## 2023-10-07 NOTE — Therapy (Signed)
 OUTPATIENT PHYSICAL THERAPY TREATMENT NOTE  Patient Name: Dana Mcmahon MRN: [id number] DOB:1955-07-01, 68 y.o., female Today's Date: 10/07/2023  END OF SESSION:  PT End of Session - 10/07/23 1058     Visit Number 2    Number of Visits 7    Date for PT Re-Evaluation 11/11/23    Authorization Type BC[employer]    PT Start Time 1058    PT Stop Time 1137    PT Time Calculation (min) 39 min    Activity Tolerance Patient tolerated treatment well    Behavior During Therapy WFL for tasks assessed/performed            Past Medical History:  Diagnosis Date   Allergic rhinitis    Anxiety    Arthritis    DM type 2 (diabetes mellitus, type 2) (HCC)    Heart murmur    Hyperlipidemia    per old records, pt has refused cholesto-lowering meds   Hypertension    Neck pain, chronic    Mild DDD, no signif progression (multiple MRI's: 2001, 2003, 2005, 2007)   Obesity, Class I, BMI 30-34.9    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   Right   Past Surgical History:  Procedure Laterality Date   ARTERY BIOPSY Left 06/13/2017   Procedure: LEFT TEMPORAL ARTERY BIOPSY;  Surgeon: Gladis Cough, MD;  Location: [facility];  Service: General;  Laterality: Left;   COLONOSCOPY     none     Patient Active Problem List   Diagnosis Date Noted   COVID-19 vaccine series declined 03/16/2020   Headache, unspecified headache type 06/03/2017   History of adenomatous polyp of colon 07/25/2016   Upper airway cough syndrome 01/22/2016   Osteoarthritis, hand 12/16/2014   Allergic rhinitis 07/24/2014   Hyperlipidemia associated with type 2 diabetes mellitus (HCC)    Obesity, Class I, BMI 30-34.9    Dizzy spells 08/08/2013   Diabetes mellitus type II, controlled (HCC) 05/10/2013   Primary hypertension 05/10/2013   Palpitations 05/10/2013    PCP: Katrinka Garnette KIDD, MD  REFERRING PROVIDER: Leonce Katz, DO  REFERRING DIAG:  Right elbow pain [M25.521], Right hand pain [M79.641], Medial epicondylitis of  right elbow [M77.01], Lateral epicondylitis of right elbow [M77.11]   THERAPY DIAG:  Pain in right elbow  Pain in right hand  Rationale for Evaluation and Treatment: Rehabilitation  ONSET DATE: 6 months   SUBJECTIVE:                                                                                                                                                                                      SUBJECTIVE STATEMENT: 10/07/2023: Patient reports her arm  is sore today. States that with all her typing at work it keeps making her arm hurt.   Patient reports to PT with R elbow and UE pain that has been present for at least 6 months. She notices the pain in her elbow after about 2 hours of activity. She also has R hand and wrist pain. Her job does require frequent typing and she has begun to utilizes some ergonomic updates to her desk to alleviate symptoms.   Hand dominance: Left  PERTINENT HISTORY: Relevant PMHx includes anxiety, type 2 DM, heart murmur, HTN, chronic neck pain with mild DDD, HA, hand OA, Obesity  PAIN:  Are you having pain?  Yes: NPRS scale: moderate Pain location: R elbow (medial and lateral aspect), R wrist/hand  Pain description: aching, stiffness, (a few time numbness in fingers) Aggravating factors: prolonged typing, end range flexion/extension, golfing, weight lifting Relieving factors: rest, heat   PRECAUTIONS: None  RED FLAGS: None   WEIGHT BEARING RESTRICTIONS: No  FALLS:  Has patient fallen in last 6 months? 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  DIAGNOSTIC FINDINGS:  Per referring provider:  X-ray obtained in clinic. My interpretation: No acute fracture or dislocation. Cortical spurring over medial and lateral epicondyle. Mild degenerative changes along medial elbow. Mild degenerative changes at first [facility] and [facility]   PATIENT SURVEYS:  Quick Dash:  QUICK DASH  Please rate your ability do the following activities in the last week by selecting the number below the appropriate response.   Activities Rating  Open a tight or new jar.  3 = Moderate difficulty  Do heavy household chores (e.g., wash walls, floors). 2 = Mild difficulty  Carry a shopping bag or briefcase 2 = Mild difficulty  Wash your back. 3 = Moderate difficulty  Use a knife to cut food. 1 = No difficulty   Recreational activities in which you take some force or impact through your arm, shoulder or hand (e.g., golf, hammering, tennis, etc.). 4 = Severe difficulty  During the past week, to what extent has your arm, shoulder or hand problem interfered with your normal social activities with family, friends, neighbors or groups?  3 = Moderately  During the past week, were you limited in your work or other regular daily activities as a result of your arm, shoulder or hand problem? 3 = Moderately limited  Rate the severity of the following symptoms in the last week: Arm, Shoulder, or hand pain. 4 = Severe  Rate the severity of the following symptoms in the last week: Tingling (pins and needles) in your arm, shoulder or hand. 2 = Mild  During the past week, how much difficulty have you had sleeping because of the pain in your arm, shoulder or hand?  3 = Moderate difficulty   (A QuickDASH score may not be calculated if there is greater than 1 missing item.)  Quick Dash Disability/Symptom Score:= 43.2%  Minimally Clinically Important Difference (MCID): 15-20 points  (Franchignoni, F. et al. (2013). Minimally clinically important difference of the disabilities of the arm, shoulder, and hand outcome measures (DASH) and its  shortened version (Quick DASH). Journal of Orthopaedic & Sports  Physical Therapy, 44(1), 30-39)   COGNITION: Overall cognitive status: Within functional limits for tasks assessed    POSTURE: WNL  UPPER EXTREMITY ROM: Elbow WFL BIL    UPPER EXTREMITY MMT:  MMT Right eval Left eval  Shoulder flexion    Shoulder extension    Shoulder abduction    Shoulder adduction    Shoulder internal rotation    Shoulder external rotation    Middle trapezius    Lower trapezius    Elbow flexion 4 5  Elbow extension 4- 5  Wrist flexion    Wrist extension    Wrist ulnar deviation    Wrist radial deviation    Wrist pronation    Wrist supination    Grip strength (lbs)    (Blank rows = not tested)   PALPATION:  Moderate tenderness to palpable with mild swelling along medial and lateral aspects of R elbow                                                                                                                             TREATMENT DATE:   [facility] Adult PT Treatment:                                                DATE:  10/07/2023 Therapeutic Exercise UBE level 1 2 min forwards, 2 min backwards 2x12 hammer curl with rotation 3lbs 3x10 pro/sup 3lbs 2x1 minute wrist flex/ext stretching 2x10 radial/ulnar deviation 3lbs  Neuro Re-ed 2x12 500g med ball squeeze with ball roll up for neuro motor control  1x6 [employer] with 2lbs for neuro motor endurance   Manual Therapy STM right extensor mass Cross friction to medial and lateral epicondyle  09/30/2023  Initial evaluation: see patient education and home exercise program as noted below     PATIENT EDUCATION: Education details: reviewed initial home exercise program; discussion of POC, prognosis and goals for skilled PT; discussion of ergonomic recommendations to decrease exacerbation of symptoms with work activities  Person educated: Patient Education method: Programmer, multimedia, Facilities manager, and Handouts Education comprehension:  verbalized understanding, returned demonstration, and needs further education  HOME EXERCISE PROGRAM: Access Code: [email or web address] URL: [email or web address] Date: 10/06/2023 Prepared by: Marko Molt  Exercises - Seated Isometric Elbow Flexion  - 2 x daily - 7 x weekly - 1 sets - 10 reps - 3-5 sec hold - Seated Isometric Elbow Extension  - 2 x daily - 7 x weekly - 1 sets - 10 reps - 3-5 sec hold  Patient Education - Office Posture - Ice - Heat  ASSESSMENT:  CLINICAL IMPRESSION: 10/07/2023 Patient presents to therapy with moderate pain of right elbow at rest. Patient with moderate soft tissue restrictions of right extensor mass and wrist extensors. Patient responds well to manual therapy and pin and stretch with reports of decreased feelings of restrictions  with movement afterwards. Patient progressed with several activities emphasizing eccentric control. Patient with reports of muscular fatigue but no increase in familiar pain. Patient continues to require skilled PT services to address deficits.    Arma is a 68 y.o. female who was seen today for physical therapy evaluation and treatment for persistent R elbow pain at medial and lateral aspects, which appears consistent with referring diagnoses of BIL medial and lateral epicondylitis. She is demonstrating full elbow AROM, decreased R elbow flexion and extension strength and moderate tenderness to palpation. She has related pain and difficulty with repetitive UE motions, including typing at work. She requires skilled PT services at this time to address relevant deficits and improve overall function.     OBJECTIVE IMPAIRMENTS: decreased activity tolerance, decreased strength, increased edema, impaired UE functional use, postural dysfunction, and pain.   ACTIVITY LIMITATIONS: carrying, lifting, and hygiene/grooming  PARTICIPATION LIMITATIONS: meal prep, cleaning, laundry, driving, community activity, and occupation  PERSONAL  FACTORS: Age, Profession, Time since onset of injury/illness/exacerbation, and 3+ comorbidities: Relevant PMHx includes anxiety, type 2 DM, heart murmur, HTN, chronic neck pain with mild DDD, HA, hand OA, Obesity are also affecting patient's functional outcome.   REHAB POTENTIAL: Fair    CLINICAL DECISION MAKING: Stable/uncomplicated  EVALUATION COMPLEXITY: Low   GOALS: Goals reviewed with patient? YES  SHORT TERM GOALS: Target date: 10/22/2023    Patient will be independent with initial home program at least 3 days/week.  Baseline: provided at eval Goal Status: INITIAL   2.  Patient will demonstrate improved R elbow flexion and extension MMT to at least 4+/5 Baseline: see objective measures Goal Status: INITIAL    LONG TERM GOALS: Target date: 11/11/2023    Patient will report improved overall functional ability with Quick DASH score of 30.  Baseline: 43.2% Goal Status: INITIAL    2.  Patient will demonstrate at least 50lb grip strength bilaterally, with minimal-to-no elbow pain. Baseline: deferred for f/u visit Goal status: INITIAL  3.  Patient will demonstrate improved tolerance of work-related activities including typing throughout the day, using necessary ergonomic adjustments, with no more than 3/10 pain at end of day. Baseline: Patient has moderate pain with work tasks, lacking proper desk set up Goal status: INITIAL  4.  Patient will demonstrate 5/5 wrist flexion/extension strength.  Baseline: deferred; limited by pain  Goal status: INITIAL    PLAN:  PT FREQUENCY: 1-2x/week  PT DURATION: 8 weeks  PLANNED INTERVENTIONS: 02835- PT Re-evaluation, 97750- Physical Performance Testing, 97110-Therapeutic exercises, 97530- Therapeutic activity, [id number]- Neuromuscular re-education, 97535- Self Care, 02859- Manual therapy, G0283- Electrical stimulation (unattended), [phone]- Ionotophoresis 4mg /ml Dexamethasone , 79439 (1-2 muscles), 20561 (3+ muscles)- Dry Needling,  Patient/Family education, Taping, Joint mobilization, Cryotherapy, and Moist heat  For all possible CPT codes, reference the Planned Interventions line above.     Check all conditions that are expected to impact treatment: {Conditions expected to impact treatment:Musculoskeletal disorders and Psychological or psychiatric disorders   If treatment provided at initial evaluation, no treatment charged due to lack of authorization.       PLAN FOR NEXT SESSION: assess grip strength BIL, assess wrist flexion/extension strength, updated HEP as indicated, begin manual therapy/STM/joint mob as indicated, distal UE strengthening include wrist, and grip strength, mobilization with movement, isometrics, consider TPDN if indicated   Alfonse Cords Mamoudou Mulvehill PT, DPT 10/07/23 11:45 AM   Outpatient Pediatric Rehab [phone]  10/07/2023 11:45 AM

## 2023-10-09 ENCOUNTER — Encounter

## 2023-10-10 ENCOUNTER — Encounter: Payer: Self-pay | Admitting: Family Medicine

## 2023-10-10 ENCOUNTER — Ambulatory Visit: Admitting: Family Medicine

## 2023-10-10 VITALS — BP 120/78 | HR 69 | Temp 97.7°F | Ht 63.0 in | Wt 167.6 lb

## 2023-10-10 DIAGNOSIS — E1169 Type 2 diabetes mellitus with other specified complication: Secondary | ICD-10-CM | POA: Diagnosis not present

## 2023-10-10 DIAGNOSIS — Z7984 Long term (current) use of oral hypoglycemic drugs: Secondary | ICD-10-CM

## 2023-10-10 DIAGNOSIS — E785 Hyperlipidemia, unspecified: Secondary | ICD-10-CM | POA: Diagnosis not present

## 2023-10-10 DIAGNOSIS — E119 Type 2 diabetes mellitus without complications: Secondary | ICD-10-CM

## 2023-10-10 DIAGNOSIS — I10 Essential (primary) hypertension: Secondary | ICD-10-CM | POA: Diagnosis not present

## 2023-10-10 MED ORDER — METFORMIN HCL ER 500 MG PO TB24: 500.0000 mg | ORAL_TABLET | Freq: Every day | ORAL | 3 refills | Status: DC

## 2023-10-10 NOTE — Patient Instructions (Addendum)
 Please stop by lab before you go If you have mychart- we will send your results within 3 business days of us  receiving them.  If you do not have mychart- we will call you about results within 5 business days of us  receiving them.  *please also note that you will see labs on mychart as soon as they post. I will later go in and write notes on them- will say notes from Dr. Katrinka   We have placed a referral for you today to diabetes education- please call their # if you do not hear within a week (may be listed below or you may see mychart message within a few days with #).    Recommended follow up: Return for next already scheduled visit or sooner if needed. - also schedule morning physical in about 6 months

## 2023-10-10 NOTE — Progress Notes (Signed)
 Phone [phone] In person visit   Subjective:   Dana Mcmahon is a 67 y.o. year old very pleasant female patient who presents for/with See problem oriented charting Chief Complaint  Patient presents with   Medical Management of Chronic Issues    Past Medical History-  Patient Active Problem List   Diagnosis Date Noted   Diabetes mellitus type II, controlled (HCC) 05/10/2013    Priority: High   Headache, unspecified headache type 06/03/2017    Priority: Medium    History of adenomatous polyp of colon 07/25/2016    Priority: Medium    Upper airway cough syndrome 01/22/2016    Priority: Medium    Hyperlipidemia associated with type 2 diabetes mellitus (HCC)     Priority: Medium    Primary hypertension 05/10/2013    Priority: Medium    Palpitations 05/10/2013    Priority: Medium    COVID-19 vaccine series declined 03/16/2020    Priority: Low   Osteoarthritis, hand 12/16/2014    Priority: Low   Allergic rhinitis 07/24/2014    Priority: Low   Obesity, Class I, BMI 30-34.9     Priority: Low   Dizzy spells 08/08/2013    Priority: Low    Medications- reviewed and updated Current Outpatient Medications  Medication Sig Dispense Refill   amLODipine  (NORVASC ) 2.5 MG tablet TAKE 1 TABLET(2.5 MG) BY MOUTH DAILY 90 tablet 3   Ascorbic Acid (VITAMIN C PO) Take 1 tablet by mouth daily.     BIOTIN PO Take 1 tablet by mouth daily.     Cholecalciferol (VITAMIN D3 PO) Take 1 capsule by mouth daily.     doxylamine, Sleep, (UNISOM) 25 MG tablet Take 25 mg by mouth at bedtime as needed for sleep.     ELDERBERRY PO Take by mouth.     fluticasone  (FLONASE ) 50 MCG/ACT nasal spray Place 2 sprays into both nostrils daily as needed. 16 g 5   glucose blood (ACCU-CHEK GUIDE) test strip Check blood sugar three times daily 100 each 11   Lancets (ACCU-CHEK MULTICLIX) lancets Check blood sugar three times daily 100 each 11   losartan  (COZAAR ) 100 MG tablet Take 1 tablet (100 mg total)  by mouth daily. 90 tablet 0   metoprolol  succinate (TOPROL -XL) 25 MG 24 hr tablet Take 0.5 tablets (12.5 mg total) by mouth daily. 45 tablet 1   Multiple Vitamin (MULTIVITAMIN WITH MINERALS) TABS tablet Take 1 tablet by mouth daily.     nortriptyline  (PAMELOR ) 10 MG capsule Take 1 capsule (10 mg total) by mouth at bedtime. 30 capsule 6   rosuvastatin  (CRESTOR ) 5 MG tablet TAKE 1 TABLET(5 MG) BY MOUTH 1 TIME A WEEK 13 tablet 3   vitamin E 1000 UNIT capsule Take 1,000 Units by mouth daily.     meloxicam  (MOBIC ) 15 MG tablet Take 1 tablet (15 mg total) by mouth daily. (Patient not taking: Reported on 10/10/2023) 30 tablet 0   metFORMIN  (GLUCOPHAGE -XR) 500 MG 24 hr tablet Take 1 tablet (500 mg total) by mouth daily with breakfast. 90 tablet 3   No current facility-administered medications for this visit.     Objective:  BP 120/78 (BP Location: Left Arm, Patient Position: Sitting, Cuff Size: Normal)   Pulse 69   Temp 97.7 F (36.5 C) (Temporal)   Ht 5' 3 (1.6 m)   Wt 167 lb 9.6 oz (76 kg)   LMP  (LMP Unknown)   SpO2 95%   BMI 29.69 kg/m  Gen: NAD, resting comfortably  CV: RRR no murmurs rubs or gallops Lungs: CTAB no crackles, wheeze, rhonchi Ext: trace edema Skin: warm, dry  Diabetic foot exam was performed with the following findings:   No deformities, ulcerations, or other skin breakdown Normal sensation of 10g monofilament Intact posterior tibialis and dorsalis pedis pulses        Assessment and Plan   #social update- hoping for retirement in 2 years  # Right third finger pain -patient previously noted some triggering in this setting 2 weeks prior to our May visit.  She was unable to lift pots/pans-offered sports medicine visit at that time but she wanted to hold off at that time.  Recommended double-ended splint.  We did not fully understand swelling at the PIP.  Her elbow was bothering her some to and recommended counterforce brace for tennis elbow -She is seeing Dr.  Leonce for the elbow and hand and x-rays were reassuring that she was started on meloxicam  and referred to physical therapy-at that point she reported full 6 months episodes -trying to rest as best as possible and see how therapy helps.  -got set up with new mouse that's ergonomic mouse but feels its slowing her down  # Diabetes S: compliant with metformin  500mg  once daily- loose stools on twice daily - Possibly could add januvia if needed if gets high and cannot control with diet/exercise.  Lab Results  Component Value Date   HGBA1C 7.9 (H) 06/30/2023   HGBA1C 7.3 (H) 05/24/2022   HGBA1C 7.2 (A) 04/01/2022  A/P:diabetes poorly controlled last visit but we will update today and continue current medications- she is firm wants to not add medicine so we discussed diabetes education and she is open to that so referred today  -Prior microalbuminuria up to 133 despite losartan - jardiance  could be helpful but she was not interested last visit.   -feels she can restart in the gym as well  Hyperlipidemia  S: Compliant with  rosuvastatin  5 mg once a week.   Lab Results  Component Value Date   CHOL 256 (H) 06/30/2023   HDL 42.40 06/30/2023   LDLDIRECT 96.0 06/30/2023   TRIG (H) 06/30/2023    583.0 Triglyceride is over 400; calculations on Lipids are invalid.   CHOLHDL 6 06/30/2023  A/P: hoping for at least some plaque stabilization with this- hoping can get triglyceride(s) down with better control of diabetes    Hypertension S:compliant with amlodipine  2.5 mg once a day, losartan  100 mg, and metoprolol  25mg  extended release.   BP Readings from Last 3 Encounters:  10/10/23 120/78  06/30/23 128/76  06/14/23 (!) 144/82  A/P: well controlled continue current medications    Headache-now seeing Dr. Georjean with trial nortriptyline  10 mg- has not started yet but may also help her with sleep so encouraged- has been having a hard time lately- encouraged trial nortriptyline - instead of  unisom  Recommended follow up: No follow-ups on file. Future Appointments  Date Time Provider Department Center  10/17/2023  3:30 PM Trudy Corean HACKNEY [facility]  10/28/2023 11:15 AM Joshua Gun, PT [facility]  11/04/2023 11:15 AM Diy, Alfonse Nadine PARAS, PT [facility]  11/22/2023  4:20 PM Santo Stanly LABOR, MD [facility]  12/22/2023  4:00 PM Georjean Darice HERO, MD [facility] None  01/15/2024  3:20 PM Katrinka Garnette KIDD, MD [facility]    Lab/Order associations:   ICD-10-CM   1. Controlled type 2 diabetes mellitus without complication, without long-term current use of insulin (HCC)  E11.9 Comprehensive metabolic panel  with GFR    Hemoglobin A1c    Microalbumin / creatinine urine ratio    Amb Referral to Nutrition and Diabetic Education    2. Primary hypertension  I10     3. Hyperlipidemia associated with type 2 diabetes mellitus (HCC)  E11.69    E78.5       Meds ordered this encounter  Medications   metFORMIN  (GLUCOPHAGE -XR) 500 MG 24 hr tablet    Sig: Take 1 tablet (500 mg total) by mouth daily with breakfast.    Dispense:  90 tablet    Refill:  3    Return precautions advised.  Garnette Lukes, MD

## 2023-10-11 ENCOUNTER — Ambulatory Visit: Payer: Self-pay | Admitting: Family Medicine

## 2023-10-11 LAB — COMPREHENSIVE METABOLIC PANEL WITH GFR
ALT: 24 U/L (ref 0–35)
AST: 20 U/L (ref 0–37)
Albumin: 4.4 g/dL (ref 3.5–5.2)
Alkaline Phosphatase: 75 U/L (ref 39–117)
BUN: 16 mg/dL (ref 6–23)
CO2: 25 meq/L (ref 19–32)
Calcium: 9.3 mg/dL (ref 8.4–10.5)
Chloride: 102 meq/L (ref 96–112)
Creatinine, Ser: 0.75 mg/dL (ref 0.40–1.20)
GFR: 81.62 mL/min (ref >60.00)
Glucose, Bld: 112 mg/dL — ABNORMAL HIGH (ref 70–99)
Potassium: 4.1 meq/L (ref 3.5–5.1)
Sodium: 138 meq/L (ref 135–145)
Total Bilirubin: 0.4 mg/dL (ref 0.2–1.2)
Total Protein: 7.9 g/dL (ref 6.0–8.3)

## 2023-10-11 LAB — HEMOGLOBIN A1C: Hgb A1c MFr Bld: 8.1 % — ABNORMAL HIGH (ref 4.6–6.5)

## 2023-10-11 LAB — MICROALBUMIN / CREATININE URINE RATIO
Creatinine,U: 76.3 mg/dL
Microalb Creat Ratio: 415.6 mg/g — ABNORMAL HIGH (ref 0.0–30.0)
Microalb, Ur: 31.7 mg/dL — ABNORMAL HIGH (ref 0.0–1.9)

## 2023-10-12 ENCOUNTER — Other Ambulatory Visit: Payer: Self-pay | Admitting: Family Medicine

## 2023-10-12 MED ORDER — EMPAGLIFLOZIN 10 MG PO TABS: 10.0000 mg | ORAL_TABLET | Freq: Every day | ORAL | 3 refills | Status: AC

## 2023-10-17 ENCOUNTER — Ambulatory Visit

## 2023-10-17 DIAGNOSIS — M25521 Pain in right elbow: Secondary | ICD-10-CM

## 2023-10-17 NOTE — Therapy (Signed)
 OUTPATIENT PHYSICAL THERAPY TREATMENT NOTE  Patient Name: Naziah Portee MRN: [id number] DOB:1955/11/21, 68 y.o., female Today's Date: 10/17/2023  END OF SESSION:  PT End of Session - 10/17/23 1525     Visit Number 3    Number of Visits 7    Date for PT Re-Evaluation 11/11/23    Authorization Type [employer]    PT Start Time 1530    PT Stop Time 1608    PT Time Calculation (min) 38 min    Activity Tolerance Patient tolerated treatment well    Behavior During Therapy [facility] for tasks assessed/performed          Past Medical History:  Diagnosis Date   Allergic rhinitis    Anxiety    Arthritis    DM type 2 (diabetes mellitus, type 2) (HCC)    Heart murmur    Hyperlipidemia    per old records, pt has refused cholesto-lowering meds   Hypertension    Neck pain, chronic    Mild DDD, no signif progression (multiple MRI's: 2001, 2003, 2005, 2007)   Obesity, Class I, BMI 30-34.9    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   Right   Past Surgical History:  Procedure Laterality Date   ARTERY BIOPSY Left 06/13/2017   Procedure: LEFT TEMPORAL ARTERY BIOPSY;  Surgeon: Gladis Cough, MD;  Location: [facility];  Service: General;  Laterality: Left;   COLONOSCOPY     none     Patient Active Problem List   Diagnosis Date Noted   COVID-19 vaccine series declined 03/16/2020   Headache, unspecified headache type 06/03/2017   History of adenomatous polyp of colon 07/25/2016   Upper airway cough syndrome 01/22/2016   Osteoarthritis, hand 12/16/2014   Allergic rhinitis 07/24/2014   Hyperlipidemia associated with type 2 diabetes mellitus (HCC)    Obesity, Class I, BMI 30-34.9    Dizzy spells 08/08/2013   Diabetes mellitus type II, controlled (HCC) 05/10/2013   Primary hypertension 05/10/2013   Palpitations 05/10/2013    PCP: Katrinka Garnette KIDD, MD  REFERRING PROVIDER: Leonce Katz, DO  REFERRING DIAG:  Right elbow pain [M25.521], Right hand pain [M79.641], Medial epicondylitis of right  elbow [M77.01], Lateral epicondylitis of right elbow [M77.11]   THERAPY DIAG:  Pain in right elbow  Rationale for Evaluation and Treatment: Rehabilitation  ONSET DATE: 6 months   SUBJECTIVE:                                                                                                                                                                                      SUBJECTIVE STATEMENT: Patient reports that she has a lot of numbness/tingling when she is  sleeping.  EVAL: Patient reports to PT with R elbow and UE pain that has been present for at least 6 months. She notices the pain in her elbow after about 2 hours of activity. She also has R hand and wrist pain. Her job does require frequent typing and she has begun to utilizes some ergonomic updates to her desk to alleviate symptoms.   Hand dominance: Left  PERTINENT HISTORY: Relevant PMHx includes anxiety, type 2 DM, heart murmur, HTN, chronic neck pain with mild DDD, HA, hand OA, Obesity  PAIN:  Are you having pain?  Yes: NPRS scale: moderate Pain location: R elbow (medial and lateral aspect), R wrist/hand  Pain description: aching, stiffness, (a few time numbness in fingers) Aggravating factors: prolonged typing, end range flexion/extension, golfing, weight lifting Relieving factors: rest, heat   PRECAUTIONS: None  RED FLAGS: None   WEIGHT BEARING RESTRICTIONS: No  FALLS:  Has patient fallen in last 6 months? 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  DIAGNOSTIC FINDINGS:  Per referring provider: X-ray obtained in clinic. My interpretation: No acute fracture or  dislocation. Cortical spurring over medial and lateral epicondyle. Mild degenerative changes along medial elbow. Mild degenerative changes at first MCP and [facility]   PATIENT SURVEYS:  Quick Dash:  QUICK DASH  Please rate your ability do the following activities in the last week by selecting the number below the appropriate response.   Activities Rating  Open a tight or new jar.  3 = Moderate difficulty  Do heavy household chores (e.g., wash walls, floors). 2 = Mild difficulty  Carry a shopping bag or briefcase 2 = Mild difficulty  Wash your back. 3 = Moderate difficulty  Use a knife to cut food. 1 = No difficulty   Recreational activities in which you take some force or impact through your arm, shoulder or hand (e.g., golf, hammering, tennis, etc.). 4 = Severe difficulty  During the past week, to what extent has your arm, shoulder or hand problem interfered with your normal social activities with family, friends, neighbors or groups?  3 = Moderately  During the past week, were you limited in your work or other regular daily activities as a result of your arm, shoulder or hand problem? 3 = Moderately limited  Rate the severity of the following symptoms in the last week: Arm, Shoulder, or hand pain. 4 = Severe  Rate the severity of the following symptoms in the last week: Tingling (pins and needles) in your arm, shoulder or hand. 2 = Mild  During the past week, how much difficulty have you had sleeping because of the pain in your arm, shoulder or hand?  3 = Moderate difficulty   (A QuickDASH score may not be calculated if there is greater than 1 missing item.)  Quick Dash Disability/Symptom Score:= 43.2%  Minimally Clinically Important Difference (MCID): 15-20 points  (Franchignoni, F. et al. (2013). Minimally clinically important difference of the disabilities of the arm, shoulder, and hand outcome measures (DASH) and its shortened version (Quick DASH). Journal of Orthopaedic & Sports  Physical Therapy, 44(1), 30-39)   COGNITION: Overall cognitive status: Within functional limits for tasks assessed  POSTURE: WNL  UPPER EXTREMITY ROM: Elbow WFL BIL    UPPER EXTREMITY MMT:  MMT Right eval Left eval  Shoulder flexion    Shoulder extension    Shoulder abduction    Shoulder adduction    Shoulder internal rotation    Shoulder external rotation    Middle trapezius    Lower trapezius    Elbow flexion 4 5  Elbow extension 4- 5  Wrist flexion    Wrist extension    Wrist ulnar deviation    Wrist radial deviation    Wrist pronation    Wrist supination    Grip strength (lbs)    (Blank rows = not tested)   PALPATION:  Moderate tenderness to palpable with mild swelling along medial and lateral aspects of R elbow                                                                                                                             TREATMENT DATE:  [facility] Adult PT Treatment:                                                DATE: 10/17/23 Therapeutic Exercise: UBE level 1 x 2 min forwards, 2 min backwards 2x12 hammer curl with rotation 3lbs (heavier next session) 3x10 pro/sup 3lbs 2x1 minute wrist flex/ext stretching 2x10 radial/ulnar deviation 3lbs Neuromuscular re-ed: 2x12 500g med ball squeeze with ball roll up wall for neuro motor control  Yellow therabar twists 2x30   [facility] Adult PT Treatment:                                                DATE:  10/07/2023 Therapeutic Exercise UBE level 1 2 min forwards, 2 min backwards 2x12 hammer curl with rotation 3lbs 3x10 pro/sup 3lbs 2x1 minute wrist flex/ext stretching 2x10 radial/ulnar deviation 3lbs  Neuro Re-ed 2x12 500g med ball squeeze with ball roll up for neuro motor control  1x6 [employer] with 2lbs for neuro motor endurance   Manual Therapy STM right extensor mass Cross friction to medial and lateral epicondyle  09/30/2023  Initial evaluation: see patient education and home exercise  program as noted below     PATIENT EDUCATION: Education details: reviewed initial home exercise program; discussion of POC, prognosis and goals for skilled PT; discussion of ergonomic recommendations to decrease exacerbation of symptoms with work activities  Person educated: Patient Education method: Programmer, multimedia, Facilities manager, and Handouts Education comprehension: verbalized understanding, returned demonstration, and needs further education  HOME EXERCISE PROGRAM: Access Code: [email or web address] URL: [email or web address] Date: 10/06/2023 Prepared by: Marko Molt  Exercises - Seated Isometric Elbow Flexion  - 2 x daily - 7 x weekly - 1 sets -  10 reps - 3-5 sec hold - Seated Isometric Elbow Extension  - 2 x daily - 7 x weekly - 1 sets - 10 reps - 3-5 sec hold  Patient Education - Office Posture - Ice - Heat  ASSESSMENT:  CLINICAL IMPRESSION: Patient presents to PT reporting continued pain in her elbow and hand, she notices that she has more numbness/tingling at night in bed. She reports that she is planning to sign up for the gym soon. Session today continued to focus on UE strengthening. Patient was able to tolerate all prescribed exercises with no adverse effects. Patient continues to benefit from skilled PT services and should be progressed as able to improve functional independence.   EVAL: Caterra is a 68 y.o. female who was seen today for physical therapy evaluation and treatment for persistent R elbow pain at medial and lateral aspects, which appears consistent with referring diagnoses of BIL medial and lateral epicondylitis. She is demonstrating full elbow AROM, decreased R elbow flexion and extension strength and moderate tenderness to palpation. She has related pain and difficulty with repetitive UE motions, including typing at work. She requires skilled PT services at this time to address relevant deficits and improve overall function.     OBJECTIVE IMPAIRMENTS:  decreased activity tolerance, decreased strength, increased edema, impaired UE functional use, postural dysfunction, and pain.   ACTIVITY LIMITATIONS: carrying, lifting, and hygiene/grooming  PARTICIPATION LIMITATIONS: meal prep, cleaning, laundry, driving, community activity, and occupation  PERSONAL FACTORS: Age, Profession, Time since onset of injury/illness/exacerbation, and 3+ comorbidities: Relevant PMHx includes anxiety, type 2 DM, heart murmur, HTN, chronic neck pain with mild DDD, HA, hand OA, Obesity are also affecting patient's functional outcome.   REHAB POTENTIAL: Fair    CLINICAL DECISION MAKING: Stable/uncomplicated  EVALUATION COMPLEXITY: Low   GOALS: Goals reviewed with patient? YES  SHORT TERM GOALS: Target date: 10/22/2023   Patient will be independent with initial home program at least 3 days/week.  Baseline: provided at eval Goal Status: INITIAL   2.  Patient will demonstrate improved R elbow flexion and extension MMT to at least 4+/5 Baseline: see objective measures Goal Status: INITIAL    LONG TERM GOALS: Target date: 11/11/2023    Patient will report improved overall functional ability with Quick DASH score of 30.  Baseline: 43.2% Goal Status: INITIAL    2.  Patient will demonstrate at least 50lb grip strength bilaterally, with minimal-to-no elbow pain. Baseline: deferred for f/u visit Goal status: INITIAL  3.  Patient will demonstrate improved tolerance of work-related activities including typing throughout the day, using necessary ergonomic adjustments, with no more than 3/10 pain at end of day. Baseline: Patient has moderate pain with work tasks, lacking proper desk set up Goal status: INITIAL  4.  Patient will demonstrate 5/5 wrist flexion/extension strength.  Baseline: deferred; limited by pain  Goal status: INITIAL   PLAN:  PT FREQUENCY: 1-2x/week  PT DURATION: 8 weeks  PLANNED INTERVENTIONS: 97164- PT Re-evaluation, 97750-  Physical Performance Testing, 97110-Therapeutic exercises, 97530- Therapeutic activity, [id number]- Neuromuscular re-education, 97535- Self Care, [id number]- Manual therapy, [id number]- Electrical stimulation (unattended), [phone]- Ionotophoresis 4mg /ml Dexamethasone , 79439 (1-2 muscles), 20561 (3+ muscles)- Dry Needling, Patient/Family education, Taping, Joint mobilization, Cryotherapy, and Moist heat  For all possible CPT codes, reference the Planned Interventions line above.     Check all conditions that are expected to impact treatment: {Conditions expected to impact treatment:Musculoskeletal disorders and Psychological or psychiatric disorders   If treatment provided at initial evaluation, no treatment charged due  to lack of authorization.       PLAN FOR NEXT SESSION: assess grip strength BIL, assess wrist flexion/extension strength, updated HEP as indicated, begin manual therapy/STM/joint mob as indicated, distal UE strengthening include wrist, and grip strength, mobilization with movement, isometrics, consider TPDN if indicated   Corean Pouch PTA  10/17/23 4:12 PM

## 2023-10-28 ENCOUNTER — Ambulatory Visit: Attending: Sports Medicine

## 2023-10-28 DIAGNOSIS — M25521 Pain in right elbow: Secondary | ICD-10-CM | POA: Diagnosis present

## 2023-10-28 DIAGNOSIS — M79641 Pain in right hand: Secondary | ICD-10-CM | POA: Insufficient documentation

## 2023-10-28 NOTE — Therapy (Signed)
 OUTPATIENT PHYSICAL THERAPY TREATMENT NOTE  Patient Name: Dana Mcmahon MRN: [id number] DOB:1955-10-11, 68 y.o., female Today's Date: 10/28/2023  END OF SESSION:  PT End of Session - 10/28/23 1111     Visit Number 4    Number of Visits 7    Date for PT Re-Evaluation 11/11/23    Authorization Type [employer]    PT Start Time 1110    PT Stop Time 1148    PT Time Calculation (min) 38 min           Past Medical History:  Diagnosis Date   Allergic rhinitis    Anxiety    Arthritis    DM type 2 (diabetes mellitus, type 2) (HCC)    Heart murmur    Hyperlipidemia    per old records, pt has refused cholesto-lowering meds   Hypertension    Neck pain, chronic    Mild DDD, no signif progression (multiple MRI's: 2001, 2003, 2005, 2007)   Obesity, Class I, BMI 30-34.9    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   Right   Past Surgical History:  Procedure Laterality Date   ARTERY BIOPSY Left 06/13/2017   Procedure: LEFT TEMPORAL ARTERY BIOPSY;  Surgeon: Gladis Cough, MD;  Location: [facility];  Service: General;  Laterality: Left;   COLONOSCOPY     none     Patient Active Problem List   Diagnosis Date Noted   COVID-19 vaccine series declined 03/16/2020   Headache, unspecified headache type 06/03/2017   History of adenomatous polyp of colon 07/25/2016   Upper airway cough syndrome 01/22/2016   Osteoarthritis, hand 12/16/2014   Allergic rhinitis 07/24/2014   Hyperlipidemia associated with type 2 diabetes mellitus (HCC)    Obesity, Class I, BMI 30-34.9    Dizzy spells 08/08/2013   Diabetes mellitus type II, controlled (HCC) 05/10/2013   Primary hypertension 05/10/2013   Palpitations 05/10/2013    PCP: Katrinka Garnette KIDD, MD  REFERRING PROVIDER: Leonce Katz, DO  REFERRING DIAG:  Right elbow pain [M25.521], Right hand pain [M79.641], Medial epicondylitis of right elbow [M77.01], Lateral epicondylitis of right elbow [M77.11]   THERAPY DIAG:  Pain in right elbow  Pain in  right hand  Rationale for Evaluation and Treatment: Rehabilitation  ONSET DATE: 6 months   SUBJECTIVE:                                                                                                                                                                                      SUBJECTIVE STATEMENT: Patient reports that her that her pain is slightly improving.    EVAL: Patient reports to PT with R elbow and UE pain that  has been present for at least 6 months. She notices the pain in her elbow after about 2 hours of activity. She also has R hand and wrist pain. Her job does require frequent typing and she has begun to utilizes some ergonomic updates to her desk to alleviate symptoms.   Hand dominance: Left  PERTINENT HISTORY: Relevant PMHx includes anxiety, type 2 DM, heart murmur, HTN, chronic neck pain with mild DDD, HA, hand OA, Obesity  PAIN:  Are you having pain?  Yes: NPRS scale: moderate Pain location: R elbow (medial and lateral aspect), R wrist/hand  Pain description: aching, stiffness, (a few time numbness in fingers) Aggravating factors: prolonged typing, end range flexion/extension, golfing, weight lifting Relieving factors: rest, heat   PRECAUTIONS: None  RED FLAGS: None   WEIGHT BEARING RESTRICTIONS: No  FALLS:  Has patient fallen in last 6 months? 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  DIAGNOSTIC FINDINGS:  Per referring provider: X-ray obtained in clinic. My interpretation: No acute fracture or dislocation. Cortical spurring over medial and lateral epicondyle. Mild degenerative changes along  medial elbow. Mild degenerative changes at first [facility] and [facility]   PATIENT SURVEYS:  Quick Dash:  QUICK DASH  Please rate your ability do the following activities in the last week by selecting the number below the appropriate response.   Activities Rating  Open a tight or new jar.  3 = Moderate difficulty  Do heavy household chores (e.g., wash walls, floors). 2 = Mild difficulty  Carry a shopping bag or briefcase 2 = Mild difficulty  Wash your back. 3 = Moderate difficulty  Use a knife to cut food. 1 = No difficulty   Recreational activities in which you take some force or impact through your arm, shoulder or hand (e.g., golf, hammering, tennis, etc.). 4 = Severe difficulty  During the past week, to what extent has your arm, shoulder or hand problem interfered with your normal social activities with family, friends, neighbors or groups?  3 = Moderately  During the past week, were you limited in your work or other regular daily activities as a result of your arm, shoulder or hand problem? 3 = Moderately limited  Rate the severity of the following symptoms in the last week: Arm, Shoulder, or hand pain. 4 = Severe  Rate the severity of the following symptoms in the last week: Tingling (pins and needles) in your arm, shoulder or hand. 2 = Mild  During the past week, how much difficulty have you had sleeping because of the pain in your arm, shoulder or hand?  3 = Moderate difficulty   (A QuickDASH score may not be calculated if there is greater than 1 missing item.)  Quick Dash Disability/Symptom Score:= 43.2%  Minimally Clinically Important Difference (MCID): 15-20 points  (Franchignoni, F. et al. (2013). Minimally clinically important difference of the disabilities of the arm, shoulder, and hand outcome measures (DASH) and its shortened version (Quick DASH). Journal of Orthopaedic & Sports Physical Therapy, 44(1), 30-39)   COGNITION: Overall cognitive status: Within functional limits for  tasks assessed    POSTURE: WNL  UPPER EXTREMITY ROM: Elbow WFL BIL  UPPER EXTREMITY MMT:  MMT Right eval Left eval  Shoulder flexion    Shoulder extension    Shoulder abduction    Shoulder adduction    Shoulder internal rotation    Shoulder external rotation    Middle trapezius    Lower trapezius    Elbow flexion 4 5  Elbow extension 4- 5  Wrist flexion    Wrist extension    Wrist ulnar deviation    Wrist radial deviation    Wrist pronation    Wrist supination    Grip strength (lbs)    (Blank rows = not tested)   PALPATION:  Moderate tenderness to palpable with mild swelling along medial and lateral aspects of R elbow                                                                                                                             TREATMENT DATE:    [facility] Adult PT Treatment:                                                DATE: 10/28/2023  Therapeutic Exercise: UBE level 1 x 2 min forwards, 2 min backwards 2x15 pro/sup 3lbs 2x15 wrist flexion/extension 3lbs 2x15 radial/ulnar deviation 3lbs Velcro board w/large roller x 3 laps for supination/pronation and grip strengthening  Passive stretching of extensors and supinator, including pin and stretch intermittent with manual therapy   Manual Therapy  STM and myofascial trigger point release along extensor mm group and proximal supinator  [facility] Adult PT Treatment:                                                DATE: 10/17/23 Therapeutic Exercise: UBE level 1 x 2 min forwards, 2 min backwards 2x12 hammer curl with rotation 3lbs (heavier next session) 3x10 pro/sup 3lbs 2x1 minute wrist flex/ext stretching 2x10 radial/ulnar deviation 3lbs Neuromuscular re-ed: 2x12 500g med ball squeeze with ball roll up wall for neuro motor control  Yellow therabar twists 2x30   [facility] Adult PT Treatment:                                                DATE:  10/07/2023 Therapeutic Exercise UBE level 1 2 min forwards, 2  min backwards 2x12 hammer curl with rotation 3lbs 3x10 pro/sup 3lbs 2x1 minute wrist flex/ext stretching 2x10 radial/ulnar deviation 3lbs  Neuro Re-ed 2x12 500g med ball squeeze with ball roll up for neuro motor control  1x6 [employer] with 2lbs for neuro motor endurance   Manual Therapy STM  right extensor mass Cross friction to medial and lateral epicondyle     PATIENT EDUCATION: Education details: reviewed initial home exercise program; discussion of POC, prognosis and goals for skilled PT; discussion of ergonomic recommendations to decrease exacerbation of symptoms with work activities  Person educated: Patient Education method: Programmer, multimedia, Facilities manager, and Handouts Education comprehension: verbalized understanding, returned demonstration, and needs further education  HOME EXERCISE PROGRAM: Access Code: [email or web address] URL: [email or web address] Date: 10/06/2023 Prepared by: Marko Molt  Exercises - Seated Isometric Elbow Flexion  - 2 x daily - 7 x weekly - 1 sets - 10 reps - 3-5 sec hold - Seated Isometric Elbow Extension  - 2 x daily - 7 x weekly - 1 sets - 10 reps - 3-5 sec hold  Patient Education - Office Posture - Ice - Heat  ASSESSMENT:  CLINICAL IMPRESSION: 10/28/2023 Dana Mcmahon had good tolerance of today's treatment session, and responded well to soft tissue mobilization, along with passive stretching of wrist extensors and supinator mm. We will continue to progress per POC as tolerated, in order to reach established rehab goals.     EVAL: Dana Mcmahon is a 68 y.o. female who was seen today for physical therapy evaluation and treatment for persistent R elbow pain at medial and lateral aspects, which appears consistent with referring diagnoses of BIL medial and lateral epicondylitis. She is demonstrating full elbow AROM, decreased R elbow flexion and extension strength and moderate tenderness to palpation. She has related pain and difficulty with repetitive UE  motions, including typing at work. She requires skilled PT services at this time to address relevant deficits and improve overall function.     OBJECTIVE IMPAIRMENTS: decreased activity tolerance, decreased strength, increased edema, impaired UE functional use, postural dysfunction, and pain.   ACTIVITY LIMITATIONS: carrying, lifting, and hygiene/grooming  PARTICIPATION LIMITATIONS: meal prep, cleaning, laundry, driving, community activity, and occupation  PERSONAL FACTORS: Age, Profession, Time since onset of injury/illness/exacerbation, and 3+ comorbidities: Relevant PMHx includes anxiety, type 2 DM, heart murmur, HTN, chronic neck pain with mild DDD, HA, hand OA, Obesity are also affecting patient's functional outcome.   REHAB POTENTIAL: Fair    CLINICAL DECISION MAKING: Stable/uncomplicated  EVALUATION COMPLEXITY: Low   GOALS: Goals reviewed with patient? YES  SHORT TERM GOALS: Target date: 10/22/2023   Patient will be independent with initial home program at least 3 days/week.  Baseline: provided at eval Goal Status: INITIAL   2.  Patient will demonstrate improved R elbow flexion and extension MMT to at least 4+/5 Baseline: see objective measures Goal Status: INITIAL    LONG TERM GOALS: Target date: 11/11/2023    Patient will report improved overall functional ability with Quick DASH score of 30.  Baseline: 43.2% Goal Status: INITIAL    2.  Patient will demonstrate at least 50lb grip strength bilaterally, with minimal-to-no elbow pain. Baseline: deferred for f/u visit Goal status: INITIAL  3.  Patient will demonstrate improved tolerance of work-related activities including typing throughout the day, using necessary ergonomic adjustments, with no more than 3/10 pain at end of day. Baseline: Patient has moderate pain with work tasks, lacking proper desk set up Goal status: INITIAL  4.  Patient will demonstrate 5/5 wrist flexion/extension strength.  Baseline:  deferred; limited by pain  Goal status: INITIAL   PLAN:  PT FREQUENCY: 1-2x/week  PT DURATION: 8 weeks  PLANNED INTERVENTIONS: 97164- PT Re-evaluation, 97750- Physical Performance Testing, 97110-Therapeutic exercises, 97530- Therapeutic activity, [id number]- Neuromuscular re-education, 97535- Self Care, 02859- Manual therapy, G0283- Electrical stimulation (unattended),  02966- Ionotophoresis 4mg /ml Dexamethasone , 20560 (1-2 muscles), 20561 (3+ muscles)- Dry Needling, Patient/Family education, Taping, Joint mobilization, Cryotherapy, and Moist heat  For all possible CPT codes, reference the Planned Interventions line above.     Check all conditions that are expected to impact treatment: {Conditions expected to impact treatment:Musculoskeletal disorders and Psychological or psychiatric disorders   If treatment provided at initial evaluation, no treatment charged due to lack of authorization.       PLAN FOR NEXT SESSION: assess grip strength BIL, assess wrist flexion/extension strength, updated HEP as indicated, begin manual therapy/STM/joint mob as indicated, distal UE strengthening include wrist, and grip strength, mobilization with movement, isometrics, consider TPDN if indicated   Marko Molt, PT, DPT  10/29/2023 6:09 PM

## 2023-11-01 ENCOUNTER — Telehealth: Payer: Self-pay | Admitting: Sports Medicine

## 2023-11-01 NOTE — Telephone Encounter (Signed)
 Patient called stating her job is needing a letter from Dr Leonce for her to be able to have a keyboard tray for her desk. She already got the ergonomic mouse, but would like the keyboard tray as well. Her job requires a doctors note to be able to accommodate.  She also said that she is having a lot of pain in her right hard, especially when typing. She was off work for a week and felt much better being able to rest it. She asked if Dr Leonce would be able to write her out of work for give her more time to rest? She asked if someone could call her to discuss further.

## 2023-11-03 ENCOUNTER — Encounter: Payer: Self-pay | Admitting: Sports Medicine

## 2023-11-03 NOTE — Telephone Encounter (Signed)
 Patient called and said that her note should say keyboard tray and not desk.

## 2023-11-03 NOTE — Telephone Encounter (Signed)
 Addend and given to front desk

## 2023-11-04 ENCOUNTER — Ambulatory Visit

## 2023-11-04 DIAGNOSIS — M25521 Pain in right elbow: Secondary | ICD-10-CM

## 2023-11-04 DIAGNOSIS — M79641 Pain in right hand: Secondary | ICD-10-CM

## 2023-11-04 NOTE — Therapy (Addendum)
 OUTPATIENT PHYSICAL THERAPY TREATMENT NOTE  Patient Name: Dana Mcmahon MRN: [id number] DOB:05-10-1955, 68 y.o., female Today's Date: 11/04/2023  END OF SESSION:  PT End of Session - 11/04/23 1148     Visit Number 5    Number of Visits 7    Date for PT Re-Evaluation 11/11/23    Authorization Type [employer]    PT Start Time 1113    PT Stop Time 1154    PT Time Calculation (min) 41 min    Activity Tolerance Patient tolerated treatment well    Behavior During Therapy WFL for tasks assessed/performed            Past Medical History:  Diagnosis Date   Allergic rhinitis    Anxiety    Arthritis    DM type 2 (diabetes mellitus, type 2) (HCC)    Heart murmur    Hyperlipidemia    per old records, pt has refused cholesto-lowering meds   Hypertension    Neck pain, chronic    Mild DDD, no signif progression (multiple MRI's: 2001, 2003, 2005, 2007)   Obesity, Class I, BMI 30-34.9    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   Right   Past Surgical History:  Procedure Laterality Date   ARTERY BIOPSY Left 06/13/2017   Procedure: LEFT TEMPORAL ARTERY BIOPSY;  Surgeon: Gladis Cough, MD;  Location: [facility];  Service: General;  Laterality: Left;   COLONOSCOPY     none     Patient Active Problem List   Diagnosis Date Noted   COVID-19 vaccine series declined 03/16/2020   Headache, unspecified headache type 06/03/2017   History of adenomatous polyp of colon 07/25/2016   Upper airway cough syndrome 01/22/2016   Osteoarthritis, hand 12/16/2014   Allergic rhinitis 07/24/2014   Hyperlipidemia associated with type 2 diabetes mellitus (HCC)    Obesity, Class I, BMI 30-34.9    Dizzy spells 08/08/2013   Diabetes mellitus type II, controlled (HCC) 05/10/2013   Primary hypertension 05/10/2013   Palpitations 05/10/2013    PCP: Katrinka Garnette KIDD, MD  REFERRING PROVIDER: Leonce Katz, DO  REFERRING DIAG:  Right elbow pain [M25.521], Right hand pain [M79.641], Medial epicondylitis of  right elbow [M77.01], Lateral epicondylitis of right elbow [M77.11]   THERAPY DIAG:  Pain in right elbow  Pain in right hand  Rationale for Evaluation and Treatment: Rehabilitation  ONSET DATE: 6 months   SUBJECTIVE:                                                                                                                                                                                      SUBJECTIVE STATEMENT: Patient reports that her pain  was worse last night and that the pain kept her awake all night.    EVAL: Patient reports to PT with R elbow and UE pain that has been present for at least 6 months. She notices the pain in her elbow after about 2 hours of activity. She also has R hand and wrist pain. Her job does require frequent typing and she has begun to utilizes some ergonomic updates to her desk to alleviate symptoms.   Hand dominance: Left  PERTINENT HISTORY: Relevant PMHx includes anxiety, type 2 DM, heart murmur, HTN, chronic neck pain with mild DDD, HA, hand OA, Obesity  PAIN:  Are you having pain?  Yes: NPRS scale: moderate Pain location: R elbow (medial and lateral aspect), R wrist/hand  Pain description: aching, stiffness, (a few time numbness in fingers) Aggravating factors: prolonged typing, end range flexion/extension, golfing, weight lifting Relieving factors: rest, heat   PRECAUTIONS: None  RED FLAGS: None   WEIGHT BEARING RESTRICTIONS: No  FALLS:  Has patient fallen in last 6 months? 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  DIAGNOSTIC FINDINGS:  Per referring provider: X-ray obtained in  clinic. My interpretation: No acute fracture or dislocation. Cortical spurring over medial and lateral epicondyle. Mild degenerative changes along medial elbow. Mild degenerative changes at first [facility] and [facility]   PATIENT SURVEYS:  Quick Dash:  QUICK DASH  Please rate your ability do the following activities in the last week by selecting the number below the appropriate response.   Activities Rating  Open a tight or new jar.  3 = Moderate difficulty  Do heavy household chores (e.g., wash walls, floors). 2 = Mild difficulty  Carry a shopping bag or briefcase 2 = Mild difficulty  Wash your back. 3 = Moderate difficulty  Use a knife to cut food. 1 = No difficulty   Recreational activities in which you take some force or impact through your arm, shoulder or hand (e.g., golf, hammering, tennis, etc.). 4 = Severe difficulty  During the past week, to what extent has your arm, shoulder or hand problem interfered with your normal social activities with family, friends, neighbors or groups?  3 = Moderately  During the past week, were you limited in your work or other regular daily activities as a result of your arm, shoulder or hand problem? 3 = Moderately limited  Rate the severity of the following symptoms in the last week: Arm, Shoulder, or hand pain. 4 = Severe  Rate the severity of the following symptoms in the last week: Tingling (pins and needles) in your arm, shoulder or hand. 2 = Mild  During the past week, how much difficulty have you had sleeping because of the pain in your arm, shoulder or hand?  3 = Moderate difficulty   (A QuickDASH score may not be calculated if there is greater than 1 missing item.)  Quick Dash Disability/Symptom Score:= 43.2%  Minimally Clinically Important Difference (MCID): 15-20 points  (Franchignoni, F. et al. (2013). Minimally clinically important difference of the disabilities of the arm, shoulder, and hand outcome measures (DASH) and its shortened version  (Quick DASH). Journal of Orthopaedic & Sports Physical Therapy,  44(1), 30-39)   COGNITION: Overall cognitive status: Within functional limits for tasks assessed    POSTURE: WNL  UPPER EXTREMITY ROM: Elbow WFL BIL    UPPER EXTREMITY MMT:  MMT Right eval Left eval  Shoulder flexion    Shoulder extension    Shoulder abduction    Shoulder adduction    Shoulder internal rotation    Shoulder external rotation    Middle trapezius    Lower trapezius    Elbow flexion 4 5  Elbow extension 4- 5  Wrist flexion    Wrist extension    Wrist ulnar deviation    Wrist radial deviation    Wrist pronation    Wrist supination    Grip strength (lbs)    (Blank rows = not tested)   PALPATION:  Moderate tenderness to palpable with mild swelling along medial and lateral aspects of R elbow                                                                                                                             TREATMENT DATE:    [facility] Adult PT Treatment:                                                 11/04/2023 Manual Therapy STM to flexor and extensor masses Pin and stretch of flexor and extensors  Therapeutic Exercise 3x10 supine wrist flexion 3lbs  3x10 finger extension with rubber band 3x10 reps bicep curls 5lbs with concurrent RTB abd UBE level 2; 2 minutes fwd/2 minutes bkwd Prayer stretch x1 minute  Neuro Re-ed 3x30 seconds med ball circles 500g 3x10 med ball squeeze with ball roll up wall 500g  DATE: 10/28/2023  Therapeutic Exercise: UBE level 1 x 2 min forwards, 2 min backwards 2x15 pro/sup 3lbs 2x15 wrist flexion/extension 3lbs 2x15 radial/ulnar deviation 3lbs Velcro board w/large roller x 3 laps for supination/pronation and grip strengthening  Passive stretching of extensors and supinator, including pin and stretch intermittent with manual therapy   Manual Therapy  STM and myofascial trigger point release along extensor mm group and proximal supinator  [facility]  Adult PT Treatment:                                                DATE: 10/17/23 Therapeutic Exercise: UBE level 1 x 2 min forwards, 2 min backwards 2x12 hammer curl with rotation 3lbs (heavier next session) 3x10 pro/sup 3lbs 2x1 minute wrist flex/ext stretching 2x10 radial/ulnar deviation 3lbs Neuromuscular re-ed: 2x12 500g med ball squeeze with ball roll up wall for neuro motor control  Yellow therabar twists 2x30   [facility] Adult PT Treatment:  DATE:  10/07/2023 Therapeutic Exercise UBE level 1 2 min forwards, 2 min backwards 2x12 hammer curl with rotation 3lbs 3x10 pro/sup 3lbs 2x1 minute wrist flex/ext stretching 2x10 radial/ulnar deviation 3lbs  Neuro Re-ed 2x12 500g med ball squeeze with ball roll up for neuro motor control  1x6 [employer] with 2lbs for neuro motor endurance   Manual Therapy STM right extensor mass Cross friction to medial and lateral epicondyle     PATIENT EDUCATION: Education details: reviewed initial home exercise program; discussion of POC, prognosis and goals for skilled PT; discussion of ergonomic recommendations to decrease exacerbation of symptoms with work activities  Person educated: Patient Education method: Programmer, Multimedia, Facilities Manager, and Handouts Education comprehension: verbalized understanding, returned demonstration, and needs further education  HOME EXERCISE PROGRAM: Access Code: [email or web address] URL: [email or web address] Date: 10/06/2023 Prepared by: Marko Molt  Exercises - Seated Isometric Elbow Flexion  - 2 x daily - 7 x weekly - 1 sets - 10 reps - 3-5 sec hold - Seated Isometric Elbow Extension  - 2 x daily - 7 x weekly - 1 sets - 10 reps - 3-5 sec hold  Patient Education - Office Posture - Ice - Heat  ASSESSMENT:  CLINICAL IMPRESSION: 11/04/2023 Heavenlee arrives to therapy with increased pain at rest. Reports pain kept her awake last night. Patient with increased  point tenderness of right flexor and extensor masses. Patient responds well to soft tissue mobilizations and pin and stretch technique. Patient continues to demonstrate functional weakness and decreased activity tolerance leading to decreased work tolerance. We will continue to progress per POC as tolerated, in order to reach established rehab goals.     EVAL: Dana Mcmahon is a 68 y.o. female who was seen today for physical therapy evaluation and treatment for persistent R elbow pain at medial and lateral aspects, which appears consistent with referring diagnoses of BIL medial and lateral epicondylitis. She is demonstrating full elbow AROM, decreased R elbow flexion and extension strength and moderate tenderness to palpation. She has related pain and difficulty with repetitive UE motions, including typing at work. She requires skilled PT services at this time to address relevant deficits and improve overall function.     OBJECTIVE IMPAIRMENTS: decreased activity tolerance, decreased strength, increased edema, impaired UE functional use, postural dysfunction, and pain.   ACTIVITY LIMITATIONS: carrying, lifting, and hygiene/grooming  PARTICIPATION LIMITATIONS: meal prep, cleaning, laundry, driving, community activity, and occupation  PERSONAL FACTORS: Age, Profession, Time since onset of injury/illness/exacerbation, and 3+ comorbidities: Relevant PMHx includes anxiety, type 2 DM, heart murmur, HTN, chronic neck pain with mild DDD, HA, hand OA, Obesity are also affecting patient's functional outcome.   REHAB POTENTIAL: Fair    CLINICAL DECISION MAKING: Stable/uncomplicated  EVALUATION COMPLEXITY: Low   GOALS: Goals reviewed with patient? YES  SHORT TERM GOALS: Target date: 10/22/2023   Patient will be independent with initial home program at least 3 days/week.  Baseline: provided at eval Goal Status: INITIAL   2.  Patient will demonstrate improved R elbow flexion and extension MMT to at least  4+/5 Baseline: see objective measures Goal Status: INITIAL    LONG TERM GOALS: Target date: 11/11/2023    Patient will report improved overall functional ability with Quick DASH score of 30.  Baseline: 43.2% Goal Status: INITIAL    2.  Patient will demonstrate at least 50lb grip strength bilaterally, with minimal-to-no elbow pain. Baseline: deferred for f/u visit Goal status: INITIAL  3.  Patient will demonstrate improved tolerance of work-related activities including typing throughout  the day, using necessary ergonomic adjustments, with no more than 3/10 pain at end of day. Baseline: Patient has moderate pain with work tasks, lacking proper desk set up Goal status: INITIAL  4.  Patient will demonstrate 5/5 wrist flexion/extension strength.  Baseline: deferred; limited by pain  Goal status: INITIAL   PLAN:  PT FREQUENCY: 1-2x/week  PT DURATION: 8 weeks  PLANNED INTERVENTIONS: 97164- PT Re-evaluation, 97750- Physical Performance Testing, 97110-Therapeutic exercises, 97530- Therapeutic activity, 97112- Neuromuscular re-education, 97535- Self Care, 02859- Manual therapy, G0283- Electrical stimulation (unattended), [phone]- Ionotophoresis 4mg /ml Dexamethasone , 79439 (1-2 muscles), 20561 (3+ muscles)- Dry Needling, Patient/Family education, Taping, Joint mobilization, Cryotherapy, and Moist heat  For all possible CPT codes, reference the Planned Interventions line above.     Check all conditions that are expected to impact treatment: {Conditions expected to impact treatment:Musculoskeletal disorders and Psychological or psychiatric disorders   If treatment provided at initial evaluation, no treatment charged due to lack of authorization.      PHYSICAL THERAPY DISCHARGE SUMMARY  Visits from Start of Care: 5  Current functional level related to goals / functional outcomes: Independent   Remaining deficits: Unable to assess   Education / Equipment: Unable to assess    Patient agrees to discharge. Patient goals were not met. Patient is being discharged due to not returning since the last visit.    PLAN FOR NEXT SESSION: assess grip strength BIL, assess wrist flexion/extension strength, updated HEP as indicated, begin manual therapy/STM/joint mob as indicated, distal UE strengthening include wrist, and grip strength, mobilization with movement, isometrics, consider TPDN if indicated  Alfonse Cords Nelsie Domino PT, DPT 11/04/23 11:56 AM   Outpatient Pediatric Rehab [phone]  11/04/2023 11:56 AM

## 2023-11-08 NOTE — Progress Notes (Unsigned)
 [facility] [address] [place] [address] Phone: [phone]   Assessment and Plan:     1. Right elbow pain (Primary) 2. Right hand pain 3. Medial epicondylitis of right elbow 4. 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  Potentially caused by overuse typing - Will trial activity modification with decreasing work hours.  Work note provided the patient may work 6 hours maximum for the next 3 weeks. - Recommend using wrist brace at all times unless performing HEP - Continue HEP and physical therapy - Start meloxicam  15 mg daily x2 weeks.  If still having pain after 2 weeks, complete 3rd-week of NSAID. May use remaining NSAID as needed once daily for pain control.  Do not to use additional over-the-counter NSAIDs (ibuprofen, naproxen, Advil, Aleve, etc.) while taking prescription NSAIDs.  May use Tylenol  [phone] mg 2 to 3 times a day for breakthrough pain.  -Patient has been adamant for notes to include multiple work accommodations, work notes to be on restricted work hours.  As in all cases that involve work, potential for malingering for secondary gain.  Pertinent previous records reviewed include none   Follow Up: 6 weeks for reevaluation.  Could consider ECSWT versus prednisone  course versus advanced imaging   Subjective:   I, Dana Mcmahon, am serving as a Neurosurgeon for Doctor Dana Mcmahon   Chief Complaint: right arm pain    HPI:    09/14/2023 Patient is a 68 year old female with right arm pain. Patient states pain for about 6 months. No MOI. Pain radiates down the arm. No numbness or tingling. No meds for the pain.ROM WNL. Pain is only present when she types at work.    11/09/2023 Patient states no improvement. Has pain when she is typing . Decreased ROM   Relevant  Historical Information: Hypertension, DM type II  Additional pertinent review of systems negative.   Current Outpatient Medications:    meloxicam  (MOBIC ) 15 MG tablet, Take 1 tablet daily for 2 weeks.  If still in pain after 2 weeks, take 1 tablet daily for an additional 1 week., Disp: 30 tablet, Rfl: 0   amLODipine  (NORVASC ) 2.5 MG tablet, TAKE 1 TABLET(2.5 MG) BY MOUTH DAILY, Disp: 90 tablet, Rfl: 3   Ascorbic Acid (VITAMIN C PO), Take 1 tablet by mouth daily., Disp: , Rfl:    BIOTIN PO, Take 1 tablet by mouth daily., Disp: , Rfl:    Cholecalciferol (VITAMIN D3 PO), Take 1 capsule by mouth daily., Disp: , Rfl:    doxylamine, Sleep, (UNISOM) 25 MG tablet, Take 25 mg by mouth at bedtime as needed for sleep., Disp: , Rfl:    ELDERBERRY PO, Take by mouth., Disp: , Rfl:    empagliflozin  (JARDIANCE ) 10 MG TABS tablet, Take 1 tablet (10 mg total) by mouth daily before breakfast., Disp: 90 tablet, Rfl: 3   fluticasone  (FLONASE ) 50 MCG/ACT nasal spray, Place 2 sprays into both nostrils daily as needed., Disp: 16 g, Rfl: 5   glucose blood (ACCU-CHEK GUIDE) test strip, Check blood sugar three times daily, Disp: 100 each, Rfl: 11   Lancets (ACCU-CHEK MULTICLIX) lancets, Check blood sugar three times daily, Disp: 100 each, Rfl: 11   losartan  (COZAAR ) 100 MG tablet, Take 1 tablet (100 mg total) by mouth daily., Disp: 90 tablet, Rfl: 0   meloxicam  (MOBIC ) 15 MG  tablet, Take 1 tablet (15 mg total) by mouth daily. (Patient not taking: Reported on 10/10/2023), Disp: 30 tablet, Rfl: 0   metFORMIN  (GLUCOPHAGE -XR) 500 MG 24 hr tablet, Take 1 tablet (500 mg total) by mouth daily with breakfast., Disp: 90 tablet, Rfl: 3   metoprolol  succinate (TOPROL -XL) 25 MG 24 hr tablet, Take 0.5 tablets (12.5 mg total) by mouth daily., Disp: 45 tablet, Rfl: 1   Multiple Vitamin (MULTIVITAMIN WITH MINERALS) TABS tablet, Take 1 tablet by mouth daily., Disp: , Rfl:    nortriptyline  (PAMELOR ) 10 MG capsule, Take 1 capsule (10 mg  total) by mouth at bedtime., Disp: 30 capsule, Rfl: 6   rosuvastatin  (CRESTOR ) 5 MG tablet, TAKE 1 TABLET(5 MG) BY MOUTH 1 TIME A WEEK, Disp: 13 tablet, Rfl: 3   vitamin E 1000 UNIT capsule, Take 1,000 Units by mouth daily., Disp: , Rfl:    Objective:     Vitals:   11/09/23 1446  Pulse: 61  SpO2: 97%  Weight: 167 lb (75.8 kg)  Height: 5' 3 (1.6 m)      Body mass index is 29.58 kg/m.    Physical Exam:    General: Appears well, no acute distress, nontoxic and pleasant Neck: FROM, no pain Neuro: sensation is intact distally with no deficits, strenghth is 5/5 in elbow flexors/extenders/supinator/pronators and wrist flexors/extensors Psych: no evidence of anxiety or depression   Right elbow: No deformity, swelling or muscle wasting Normal Carrying angle ROM:0-140, supination and pronation 90 TTP lateral epicondyle, medial epicondyle, forearm, supinator, pronator, third PIP NTTP over triceps, ticeps tendon, olecranon, l , antecubital fossa, biceps tendon,   Negative tinnels over cubital tunnel   pain with resisted wrist and middle digit extension   pain with resisted wrist flexion   pain with resisted supination   pain with resisted pronation Negative valgus stress Negative varus stress Negative milking maneuver    Electronically signed by:  Dana Mcmahon [facility] 3:02 PM 11/09/23

## 2023-11-09 ENCOUNTER — Ambulatory Visit: Admitting: Sports Medicine

## 2023-11-09 VITALS — HR 61 | Ht 63.0 in | Wt 167.0 lb

## 2023-11-09 DIAGNOSIS — M7711 Lateral epicondylitis, right elbow: Secondary | ICD-10-CM

## 2023-11-09 DIAGNOSIS — M7701 Medial epicondylitis, right elbow: Secondary | ICD-10-CM | POA: Diagnosis not present

## 2023-11-09 DIAGNOSIS — M79641 Pain in right hand: Secondary | ICD-10-CM

## 2023-11-09 DIAGNOSIS — M25521 Pain in right elbow: Secondary | ICD-10-CM

## 2023-11-09 MED ORDER — MELOXICAM 15 MG PO TABS: ORAL_TABLET | ORAL | 0 refills | Status: DC

## 2023-11-09 NOTE — Patient Instructions (Signed)
-   Start meloxicam  15 mg daily x2 weeks.  If still having pain after 2 weeks, complete 3rd-week of NSAID. May use remaining NSAID as needed once daily for pain control.  Do not to use additional over-the-counter NSAIDs (ibuprofen, naproxen, Advil, Aleve, etc.) while taking prescription NSAIDs.  May use Tylenol  [phone] mg 2 to 3 times a day for breakthrough pain.  Use wrist brace at all times   Continue HEP   Can work 6 hours maximum for the next 3 week   6 week follow up

## 2023-11-21 NOTE — Progress Notes (Unsigned)
 Cardiology Office Note:  .    Date:  11/21/2023  ID:  Dana Mcmahon, DOB 10-28-1955, MRN [id number] PCP: Katrinka Garnette KIDD, MD  [facility] Providers Cardiologist:  Aleene Passe, MD (Inactive) { Click to update primary MD,subspecialty MD or APP then REFRESH:1}    CC: Transition to new cardiologist  History of Present Illness: [name]    Dana Mcmahon is a 68 y.o. 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   Discussed the use of AI scribe software for clinical note transcription with the patient, who gave verbal consent to proceed.  Relevant histories: .  Social *** - former PN patient ROS: As per HPI.   Studies Reviewed: .     Cardiac Studies & Procedures   ______________________________________________________________________________________________   STRESS TESTS  MYOCARDIAL PERFUSION IMAGING 07/21/2017  Interpretation Summary  Nuclear stress EF: 62%.  Blood pressure demonstrated a hypertensive response to exercise.  There was no ST segment deviation noted during stress.  This is a low risk study.  The left ventricular ejection fraction is normal (55-65%).  1. EF 62%, normal wall motion. 2. Fixed small, mild apical anterior/true apex perfusion defect.  Given normal wall motion, suspect this is due to attenuation.  No ischemia. 3. Frequent PVCs and short run NSVT during exercise stress, no ST segment changes.  Overall low risk study but ventricular ectopy with stress is concerning.   ECHOCARDIOGRAM  ECHOCARDIOGRAM COMPLETE 07/21/2022  Narrative ECHOCARDIOGRAM REPORT    Patient Name:   Dana Mcmahon Date of Exam: 07/21/2022 Medical Rec #:  [id number]                Height:       63.5 in Accession #:    [id number]               Weight:       163.5 lb Date of Birth:  03-Jul-1955               BSA:          1.785 m Patient Age:    66 years                 BP:            138/65 mmHg Patient Gender: F                        HR:           64 bpm. Exam Location:  [address]  Procedure: 2D Echo, Cardiac Doppler and Color Doppler  Indications:    R55 Syncope  History:        Patient has prior history of Echocardiogram examinations. Signs/Symptoms:Edema and Syncope; Risk Factors:Hypertension, Diabetes and Dyslipidemia. Palpitations.  Sonographer:    Heather Hawks RDCS Referring Phys: ALEENE PARAS NAHSER  IMPRESSIONS   1. Left ventricular ejection fraction, by estimation, is 60 to 65%. The left ventricle has normal function. The left ventricle has no regional wall motion abnormalities. Left ventricular diastolic parameters are consistent with Grade I diastolic dysfunction (impaired relaxation). 2. Right ventricular systolic function is normal. The right ventricular size is normal. 3. The mitral valve is normal in structure. Trivial mitral valve regurgitation. No evidence of mitral stenosis. 4. The aortic valve is normal in structure. Aortic valve regurgitation is not visualized. No aortic stenosis is present. 5. The inferior vena  cava is normal in size with greater than 50% respiratory variability, suggesting right atrial pressure of 3 mmHg.  FINDINGS Left Ventricle: Left ventricular ejection fraction, by estimation, is 60 to 65%. The left ventricle has normal function. The left ventricle has no regional wall motion abnormalities. The left ventricular internal cavity size was normal in size. There is no left ventricular hypertrophy. Left ventricular diastolic parameters are consistent with Grade I diastolic dysfunction (impaired relaxation).  Right Ventricle: The right ventricular size is normal. No increase in right ventricular wall thickness. Right ventricular systolic function is normal.  Left Atrium: Left atrial size was normal in size.  Right Atrium: Right atrial size was normal in size.  Pericardium: There is no evidence of pericardial  effusion.  Mitral Valve: The mitral valve is normal in structure. Trivial mitral valve regurgitation. No evidence of mitral valve stenosis.  Tricuspid Valve: The tricuspid valve is normal in structure. Tricuspid valve regurgitation is trivial. No evidence of tricuspid stenosis.  Aortic Valve: The aortic valve is normal in structure. Aortic valve regurgitation is not visualized. No aortic stenosis is present.  Pulmonic Valve: The pulmonic valve was normal in structure. Pulmonic valve regurgitation is not visualized. No evidence of pulmonic stenosis.  Aorta: The aortic root is normal in size and structure.  Venous: The inferior vena cava is normal in size with greater than 50% respiratory variability, suggesting right atrial pressure of 3 mmHg.  IAS/Shunts: No atrial level shunt detected by color flow Doppler.   LEFT VENTRICLE PLAX 2D LVIDd:         4.10 cm   Diastology LVIDs:         2.60 cm   LV e' medial:    5.44 cm/s LV PW:         1.10 cm   LV E/e' medial:  11.5 LV IVS:        1.10 cm   LV e' lateral:   6.42 cm/s LVOT diam:     2.20 cm   LV E/e' lateral: 9.7 LV SV:         95 LV SV Index:   53 LVOT Area:     3.80 cm   RIGHT VENTRICLE RV Basal diam:  3.10 cm RV S prime:     12.90 cm/s TAPSE (M-mode): 1.3 cm  LEFT ATRIUM             Index        RIGHT ATRIUM           Index LA diam:        4.20 cm 2.35 cm/m   RA Pressure: 3.00 mmHg LA Vol (A2C):   45.9 ml 25.71 ml/m  RA Area:     14.00 cm LA Vol (A4C):   47.3 ml 26.49 ml/m  RA Volume:   33.10 ml  18.54 ml/m LA Biplane Vol: 48.7 ml 27.28 ml/m AORTIC VALVE LVOT Vmax:   118.00 cm/s LVOT Vmean:  77.050 cm/s LVOT VTI:    0.250 m  AORTA Ao Root diam: 2.80 cm Ao Asc diam:  3.60 cm  MITRAL VALVE               TRICUSPID VALVE MV Area (PHT)  cm         Estimated RAP:  3.00 mmHg MV Decel Time: 250 msec MV E velocity: 62.40 cm/s  SHUNTS MV A velocity: 93.30 cm/s  Systemic VTI:  0.25 m MV E/A ratio:  0.67  Systemic Diam: 2.20 cm  Toribio Fuel MD Electronically signed by Toribio Fuel MD Signature Date/Time: 07/21/2022/5:48:28 PM    Final    MONITORS  LONG TERM MONITOR (3-14 DAYS) 06/23/2022  Narrative   Predominate rhythm is sinus rhythm.   Rare episodes of supraventricular tachycardia.   These were very brief .   the fastest episode lasted 5 beats at a max HR of 116.   Rare PACs, rare PVCs   No significant arrhythmias observed   Patch Wear Time:  4 days and 22 hours (2024-04-10T22:37:39-0400 to 2024-04-15T21:37:32-0400)  Patient had a min HR of 53 bpm, max HR of 116 bpm, and avg HR of 70 bpm. Predominant underlying rhythm was Sinus Rhythm. 2 Supraventricular Tachycardia runs occurred, the run with the fastest interval lasting 5 beats with a max rate of 116 bpm, the longest lasting 5 beats with an avg rate of 97 bpm. Isolated SVEs were rare (<1.0%), SVE Couplets were rare (<1.0%), and no SVE Triplets were present. Isolated VEs were occasional (1.4%, 6354), VE Couplets were rare (<1.0%, 6), and no VE Triplets were present. Ventricular Bigeminy and Trigeminy were present.       ______________________________________________________________________________________________      Physical Exam:    VS:  LMP  (LMP Unknown)    Wt Readings from Last 3 Encounters:  11/09/23 167 lb (75.8 kg)  10/10/23 167 lb 9.6 oz (76 kg)  09/14/23 164 lb (74.4 kg)    Gen: *** distress, *** obese/well nourished/malnourished   Neck: No JVD, *** carotid bruit Ears: *** Frank Sign Cardiac: No Rubs or Gallops, *** Murmur, ***cardia, *** radial pulses Respiratory: Clear to auscultation bilaterally, *** effort, ***  respiratory rate GI: Soft, nontender, non-distended *** MS: No *** edema; *** moves all extremities Integument: Skin feels *** Neuro:  At time of evaluation, alert and oriented to person/place/time/situation *** Psych: Normal affect, patient feels ***   ASSESSMENT AND PLAN: .     *** An EKG was ordered for *** and shows ***  Stanly Leavens, MD FASE [facility] [place] Cardiologist [facility]  [address] [place], [place] [address] [phone]  2:23 PM

## 2023-11-22 ENCOUNTER — Encounter: Payer: Self-pay | Admitting: *Deleted

## 2023-11-22 ENCOUNTER — Ambulatory Visit: Attending: Internal Medicine | Admitting: Internal Medicine

## 2023-11-22 VITALS — BP 130/77 | HR 67 | Ht 63.5 in | Wt 165.0 lb

## 2023-11-22 DIAGNOSIS — E781 Pure hyperglyceridemia: Secondary | ICD-10-CM | POA: Diagnosis not present

## 2023-11-22 DIAGNOSIS — I10 Essential (primary) hypertension: Secondary | ICD-10-CM

## 2023-11-22 DIAGNOSIS — R55 Syncope and collapse: Secondary | ICD-10-CM

## 2023-11-22 NOTE — Patient Instructions (Signed)
 Medication Instructions:  Your physician recommends that you continue on your current medications as directed. Please refer to the Current Medication list given to you today.  *If you need a refill on your cardiac medications before your next appointment, please call your pharmacy*  Lab Work: none If you have labs (blood work) drawn today and your tests are completely normal, you will receive your results only by: MyChart Message (if you have MyChart) OR A paper copy in the mail If you have any lab test that is abnormal or we need to change your treatment, we will call you to review the results.  Testing/Procedures: Dr Santo has requested you have a Calcium  Score CT scan  Follow-Up: At [facility], you and your health needs are our priority.  As part of our continuing mission to provide you with exceptional heart care, our providers are all part of one team.  This team includes your primary Cardiologist (physician) and Advanced Practice Providers or APPs (Physician Assistants and Nurse Practitioners) who all work together to provide you with the care you need, when you need it.  Your next appointment:   6 month(s)  Provider:   Stanly DELENA Santo, MD    We recommend signing up for the patient portal called MyChart.  Sign up information is provided on this After Visit Summary.  MyChart is used to connect with patients for Virtual Visits (Telemedicine).  Patients are able to view lab/test results, encounter notes, upcoming appointments, etc.  Non-urgent messages can be sent to your provider as well.   To learn more about what you can do with MyChart, go to [email or web address].   Other Instructions

## 2023-12-04 ENCOUNTER — Ambulatory Visit (HOSPITAL_COMMUNITY)
Admission: RE | Admit: 2023-12-04 | Discharge: 2023-12-04 | Disposition: A | Discharge status: Home / Self Care | Payer: Self-pay | Source: Ambulatory Visit | Attending: Cardiovascular Disease | Admitting: Cardiovascular Disease

## 2023-12-04 DIAGNOSIS — I2510 Atherosclerotic heart disease of native coronary artery without angina pectoris: Secondary | ICD-10-CM | POA: Insufficient documentation

## 2023-12-04 DIAGNOSIS — I10 Essential (primary) hypertension: Secondary | ICD-10-CM | POA: Insufficient documentation

## 2023-12-04 DIAGNOSIS — E781 Pure hyperglyceridemia: Secondary | ICD-10-CM | POA: Insufficient documentation

## 2023-12-08 ENCOUNTER — Ambulatory Visit: Payer: Self-pay

## 2023-12-10 ENCOUNTER — Other Ambulatory Visit: Payer: Self-pay | Admitting: Sports Medicine

## 2023-12-10 ENCOUNTER — Other Ambulatory Visit: Payer: Self-pay | Admitting: Family Medicine

## 2023-12-11 ENCOUNTER — Telehealth: Payer: Self-pay | Admitting: Internal Medicine

## 2023-12-11 ENCOUNTER — Other Ambulatory Visit: Payer: Self-pay

## 2023-12-11 MED ORDER — LOSARTAN POTASSIUM 100 MG PO TABS: 100.0000 mg | ORAL_TABLET | Freq: Every day | ORAL | 3 refills | Status: AC

## 2023-12-11 MED ORDER — METOPROLOL SUCCINATE ER 25 MG PO TB24: 12.5000 mg | ORAL_TABLET | Freq: Every day | ORAL | 3 refills | Status: AC

## 2023-12-11 NOTE — Telephone Encounter (Signed)
These medications have been refilled.

## 2023-12-11 NOTE — Telephone Encounter (Signed)
*  STAT* If patient is at the pharmacy, call can be transferred to refill team.   1. Which medications need to be refilled? (please list name of each medication and dose if known) losartan  (COZAAR ) 100 MG tablet  metoprolol  succinate (TOPROL -XL) 25 MG 24 hr tablet   2. Which pharmacy/location (including street and city if local pharmacy) is medication to be sent to?  [id number] - [place], [place] - [address]      3. Do they need a 30 day or 90 day supply? 90 day

## 2023-12-12 ENCOUNTER — Other Ambulatory Visit: Payer: Self-pay | Admitting: Family Medicine

## 2023-12-19 ENCOUNTER — Ambulatory Visit: Admitting: Skilled Nursing Facility1

## 2023-12-22 ENCOUNTER — Encounter: Payer: Self-pay | Admitting: Neurology

## 2023-12-22 ENCOUNTER — Ambulatory Visit (INDEPENDENT_AMBULATORY_CARE_PROVIDER_SITE_OTHER): Admitting: Neurology

## 2023-12-22 VITALS — BP 150/76 | HR 77 | Ht 63.5 in | Wt 166.4 lb

## 2023-12-22 DIAGNOSIS — R55 Syncope and collapse: Secondary | ICD-10-CM

## 2023-12-22 DIAGNOSIS — G44219 Episodic tension-type headache, not intractable: Secondary | ICD-10-CM | POA: Diagnosis not present

## 2023-12-22 NOTE — Progress Notes (Signed)
 NEUROLOGY FOLLOW UP OFFICE NOTE  Dana Mcmahon [id number] March 18, 1955  HISTORY OF PRESENT ILLNESS: I had the pleasure of seeing Dana Mcmahon in follow-up in the neurology clinic on 12/22/2023.  The patient was last seen 7 months ago for syncope and headaches. She is alone in the office today. Records and images were personally reviewed where available.  She denies any syncopal episodes since 04/2022. She notes the headaches only occur when she is focusing on work and gets more stressed. She was reading the news and had a headache in the frontal region. Deep breathing usually helps. She takes Tylenol  as needed. She works remotely and goes to the office once a month for one week. She tried the Nortriptyline  but was not able to sleep, she is back to taking Unisom for sleep. She has occasional numbness and tingling in her fingers. She has tendinitis in the right elbow and bony changes in her fingers. She has pain in her hand after typing for a long time. She has completed PT. She has been walking more and plans to go back to the gym.    History on Initial Assessment 06/24/2022: This is a pleasant 68 year old right-handed woman with a history of hypertension, palpitations, hyperlipidemia, DM, presenting for evaluation of syncope and headaches. On 05/08/22, she passed out at a restaurant. She had not been sleeping well and had not eaten breakfast or drank much that day. She went to church, then to a restaurant where while sitting down she started having tunnel vision with things around her looking bright around them. She alerted her husband she was not feeling well and took 5 steps then her whole body felt weak. She does not recall feeling dizzy, she slid down on the wall beside her with loss of consciousness for 2-3 minutes. She felt very weak diffusely after, no tongue bite or incontinence. She felt she could not open her eyes but was very alert when she came to, saying no to EMS. She recalls  being sweaty. She denies any prior history of syncope. No staring/unresponsive episodes, gaps in time, olfactory/gustatory hallucinations, deja vu, rising epigastric sensation, focal numbness/tingling/weakness, myoclonic jerks. She sees Cardiology and had an unremarkable echocardiogram, Zio monitor no significant arrhythmias with rare very brief episodes of SVT, episode felt due to hypovolemia. Her son had seizures. She had a normal birth and early development.  There is no history of febrile convulsions, CNS infections such as meningitis/encephalitis, significant traumatic brain injury, neurosurgical procedures.  For the past 4 years, she has had sharp headaches over the midline from front to back lasting a few seconds. They occur 4-5 times a month, she had one yesterday. Sometimes pain is on the left temporal region. She was doing a lot of work yesterday and had a headache at the end of the day. No associated nausea/vomiting, photo/phonophobia, visual obscurations. She does not take any headache medication. She can continue to function but it slows her down. She has occasional neck pain. No diplopia, dysarthria/dysphagia, bowel/bladder dysfunction. She reports difficulty sleeping with 4 hours of sleep. She feels drowsy in the daytime and has been told she snores. She has a very stressful job, she has been working as Education Administrator since 2007.   I personally reviewed brain MRI without contrast done 05/2022, no acute changes, there was mild to moderate chronic microvascular disease.  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     BIOTIN PO Take 1 tablet by mouth daily.     Cholecalciferol (VITAMIN D3 PO) Take 1 capsule by mouth daily.     doxylamine, Sleep, (UNISOM) 25 MG tablet Take 25 mg by mouth at bedtime as needed for sleep.     ELDERBERRY PO Take by mouth.     empagliflozin  (JARDIANCE ) 10 MG TABS tablet Take 1 tablet (10 mg total) by mouth daily before breakfast. 90 tablet 3   fluticasone  (FLONASE ) 50 MCG/ACT nasal spray Place 2 sprays into both nostrils daily as needed. 16 g 5   glucose blood (ACCU-CHEK GUIDE) test strip Check blood sugar three times daily 100 each 11   Lancets (ACCU-CHEK MULTICLIX) lancets Check blood sugar three times daily 100 each 11   losartan  (COZAAR ) 100 MG tablet Take 1 tablet (100 mg total) by mouth daily. 90 tablet 3   meloxicam  (MOBIC ) 15 MG tablet Take 1 tablet (15 mg total) by mouth daily. 30 tablet 0   metFORMIN  (GLUCOPHAGE -XR) 500 MG 24 hr tablet TAKE 1 TABLET(500 MG) BY MOUTH TWICE DAILY WITH A MEAL 180 tablet 3   metoprolol  succinate (TOPROL -XL) 25 MG 24 hr tablet Take 0.5 tablets (12.5 mg total) by mouth daily. 45 tablet 3   Multiple Vitamin (MULTIVITAMIN WITH MINERALS) TABS tablet Take 1 tablet by mouth daily.     nortriptyline  (PAMELOR ) 10 MG capsule Take 1 capsule (10 mg total) by mouth at bedtime. 30 capsule 6   rosuvastatin  (CRESTOR ) 5 MG tablet TAKE 1 TABLET(5 MG) BY MOUTH 1 TIME A WEEK 13 tablet 3   vitamin E 1000 UNIT capsule Take 1,000 Units by mouth daily.     No current facility-administered medications on file prior to visit.    ALLERGIES: Allergies  Allergen Reactions   Codeine Nausea Only   Penicillins Nausea And Vomiting, Swelling and  Other (See Comments)    Has patient had a PCN reaction causing immediate rash, facial/tongue/throat swelling, SOB or lightheadedness with hypotension: Yes Has patient had a PCN reaction causing severe rash involving mucus membranes or skin necrosis: No Has patient had a PCN reaction that required hospitalization: No Has patient had a PCN reaction occurring within the last 10 years: No If all of the above answers are NO, then may proceed with Cephalosporin use.    Latex Rash    FAMILY HISTORY: Family History  Problem Relation Age of Onset   Stroke Mother        53s, and father 38s   Hypertension Mother        and father   Stroke Brother        in 50s   Colon cancer Neg Hx    Esophageal cancer Neg Hx    Rectal cancer Neg Hx  Stomach cancer Neg Hx     SOCIAL HISTORY: Social History   Socioeconomic History   Marital status: Married    Spouse name: Not on file   Number of children: Not on file   Years of education: Not on file   Highest education level: Not on file  Occupational History   Not on file  Tobacco Use   Smoking status: Never   Smokeless tobacco: Never  Vaping Use   Vaping status: Never Used  Substance and Sexual Activity   Alcohol use: No   Drug use: No   Sexual activity: Not Currently    Partners: Male  Other Topics Concern   Not on file  Social History Narrative   Married, 4 children (daughter Charisma patient here, husband Bretta, son [place]). 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, now is an Physiological Scientist.      Hobbies: time with family, watching news, reading      Right handed         Social Drivers of Health   Financial Resource Strain: Not on file  Food Insecurity: Not on file  Transportation Needs: Not on file  Physical Activity: Not on file  Stress: Not on file  Social Connections: Not on file  Intimate Partner Violence: Not on  file     PHYSICAL EXAM: Vitals:   12/22/23 1518 12/22/23 1556  BP: (!) 143/83 (!) 150/76  Pulse: 77   SpO2: 98%    General: No acute distress Head:  Normocephalic/atraumatic Skin/Extremities: No rash, no edema Neurological Exam: alert and awake. No aphasia or dysarthria. Fund of knowledge is appropriate.  Attention and concentration are normal.   Cranial nerves: Pupils equal, round. Extraocular movements intact with no nystagmus. Visual fields full.  No facial asymmetry.  Motor: Bulk and tone normal, muscle strength 5/5 throughout with no pronator drift.   Finger to nose testing intact.  Gait narrow-based and steady, able to tandem walk adequately.  Romberg negative.   IMPRESSION: This is a pleasant 68 yo RH woman with a history of hypertension, palpitations, hyperlipidemia, DM, who presented for syncope and headaches. Her neurological exam is normal. MRI brain unremarkable. No further syncopal episodes since 04/2021, likely due to hypovolemia. Headaches are mostly work-related, she had side effects on nortriptyline . Continue working on optician, dispensing and regular exercise. BP today elevated, continue to monitor at home and with PCP. Follow-up in 8 months, call for any changes.   Thank you for allowing me to participate in her care.  Please do not hesitate to call for any questions or concerns.    Darice Shivers, M.D.   CC: Dr. Katrinka

## 2023-12-22 NOTE — Patient Instructions (Signed)
 Good to see you. Continue working on regular exercise, stress reduction. Monitor BP at home. Follow-up in 8 months, call for any changes.

## 2024-01-15 ENCOUNTER — Encounter: Payer: Self-pay | Admitting: Neurology

## 2024-01-15 ENCOUNTER — Encounter: Admitting: Family Medicine

## 2024-01-17 NOTE — Telephone Encounter (Signed)
 Letter mailed to the patient asking her to call.

## 2024-01-22 NOTE — Telephone Encounter (Signed)
 Pt requesting a c/b to discuss CT results

## 2024-01-24 MED ORDER — ROSUVASTATIN CALCIUM 5 MG PO TABS: ORAL_TABLET | ORAL | 3 refills | Status: AC

## 2024-01-24 NOTE — Addendum Note (Signed)
 Addended by: Eleny Cortez W on: 01/24/2024 02:52 PM   Modules accepted: Orders

## 2024-02-19 ENCOUNTER — Telehealth: Admitting: Family Medicine

## 2024-02-19 ENCOUNTER — Encounter: Payer: Self-pay | Admitting: Family Medicine

## 2024-02-19 DIAGNOSIS — R052 Subacute cough: Secondary | ICD-10-CM | POA: Diagnosis not present

## 2024-02-19 MED ORDER — PROMETHAZINE-DM 6.25-15 MG/5ML PO SYRP: 5.0000 mL | ORAL_SOLUTION | Freq: Four times a day (QID) | ORAL | 0 refills | Status: AC | PRN

## 2024-02-19 MED ORDER — BENZONATATE 100 MG PO CAPS: 100.0000 mg | ORAL_CAPSULE | Freq: Three times a day (TID) | ORAL | 0 refills | Status: AC | PRN

## 2024-02-19 NOTE — Patient Instructions (Addendum)
" °"

## 2024-02-19 NOTE — Progress Notes (Unsigned)
 Virtual Visit via Video Note  I connected with Dana Mcmahon on 02/19/2024 at 4:40 PM by a video enabled telemedicine application and verified that I am speaking with the correct person using two identifiers.  Patient location: home, by self.  My location: office - [place].    I discussed the limitations, risks, security and privacy concerns of performing an evaluation and management service by telephone and the availability of in person appointments. I also discussed with the patient that there may be a patient responsible charge related to this service. The patient expressed understanding and agreed to proceed, consent obtained  Chief complaint:  Chief Complaint  Patient presents with   Cough    Sx started 12/14. Nothing makes it better. Patient was given a zpak for 5 days. Completed medication. Patient is still taking promethazine -dextromethorphan (PROMETHAZINE -DM) 6.25-15 MG/5ML syrup and guaiFENesin  (MUCINEX  DM) 30-600 MG 12hr tablet has not helped. It goes away for 2 days and then it comes back. Was not able to get a xray. Denies fever, chills and body aches    History of Present Illness: Dana Mcmahon is a 68 y.o. female  Cough:  Chart reviewed.  She was seen at [facility] urgent care on December 14.  Per that note, cough had been present for 1 week with no relief with over-the-counter medications.  Cough was worsening, fever and chills for 2 nights.  Coricidin was ineffective.  Afebrile in office at that time with O2 sat 95%.  Exam indicated normal breath sounds without wheezing rales or rhonchi.  She was diagnosed with acute bacterial bronchitis and treated with azithromycin  Z-Pak, Phenergan  DM cough syrup every 6 hours as needed  -mainly at bedtime, and Mucinex  DM every morning.  Was advised to be seen if not improving for chest x-ray.  Symptomatic care also discussed. No hx of asthma, nonsmoker.   Today, patient states cough  has been persistent.  Will get better for a few days and returns.  Initially tried phenergan  DM at night - helped cough. Tried taking during day, up to every 6 hours when off work- helped with cough, but made her tired. Has been resting. Mucinex  DM makes cough more.  No recent fever.  Episodic dyspnea with coughing fit only.  Eating/drinking ok. Tea with elderberry. MVI,  Phenergan  DM does help cough some at night, but can wake   Patient Active Problem List   Diagnosis Date Noted   COVID-19 vaccine series declined 03/16/2020   Headache, unspecified headache type 06/03/2017   History of adenomatous polyp of colon 07/25/2016   Upper airway cough syndrome 01/22/2016   Osteoarthritis, hand 12/16/2014   Allergic rhinitis 07/24/2014   Hyperlipidemia associated with type 2 diabetes mellitus (HCC)    Obesity, Class I, BMI 30-34.9    Dizzy spells 08/08/2013   Diabetes mellitus type II, controlled (HCC) 05/10/2013   Primary hypertension 05/10/2013   Palpitations 05/10/2013   Past Medical History:  Diagnosis Date   Allergic rhinitis    Anxiety    Arthritis    DM type 2 (diabetes mellitus, type 2) (HCC)    Heart murmur    Hyperlipidemia    per old records, pt has refused cholesto-lowering meds   Hypertension    Neck pain, chronic    Mild DDD, no signif progression (multiple MRI's: 2001, 2003, 2005, 2007)   Obesity, Class I, BMI 30-34.9    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 Authorizing Provider  amLODipine  (NORVASC ) 2.5 MG tablet TAKE 1 TABLET(2.5 MG) BY MOUTH DAILY 12/12/23  Yes Katrinka Garnette KIDD, MD  Ascorbic Acid (VITAMIN C PO) Take 1 tablet by mouth  daily.   Yes [provider]  BIOTIN PO Take 1 tablet by mouth daily.   Yes [provider]  Cholecalciferol (VITAMIN D3 PO) Take 1 capsule by mouth daily.   Yes [provider]  dextromethorphan-guaiFENesin  (MUCINEX  DM) 30-600 MG 12hr tablet Take 1 tablet by mouth every morning. 02/04/24  Yes [provider]  doxylamine, Sleep, (UNISOM) 25 MG tablet Take 25 mg by mouth at bedtime as needed for sleep.   Yes [provider]  ELDERBERRY PO Take by mouth.   Yes [provider]  empagliflozin  (JARDIANCE ) 10 MG TABS tablet Take 1 tablet (10 mg total) by mouth daily before breakfast. 10/12/23  Yes Katrinka Garnette KIDD, MD  fluticasone  (FLONASE ) 50 MCG/ACT nasal spray Place 2 sprays into both nostrils daily as needed. 11/15/22  Yes Katrinka Garnette KIDD, MD  glucose blood (ACCU-CHEK GUIDE) test strip Check blood sugar three times daily 06/16/17  Yes Katrinka Garnette KIDD, MD  Lancets (ACCU-CHEK MULTICLIX) lancets Check blood sugar three times daily 06/16/17  Yes Katrinka Garnette KIDD, MD  losartan  (COZAAR ) 100 MG tablet Take 1 tablet (100 mg total) by mouth daily. 12/11/23  Yes Chandrasekhar, Mahesh A, MD  meloxicam  (MOBIC ) 15 MG tablet Take 1 tablet (15 mg total) by mouth daily. 09/14/23  Yes Leonce Katz, DO  metFORMIN  (GLUCOPHAGE -XR) 500 MG 24 hr tablet TAKE 1 TABLET(500 MG) BY MOUTH TWICE DAILY WITH A MEAL Patient taking differently: 500 mg daily. 12/11/23  Yes Katrinka Garnette KIDD, MD  metoprolol  succinate (TOPROL -XL) 25 MG 24 hr tablet Take 0.5 tablets (12.5 mg total) by mouth daily. 12/11/23  Yes Chandrasekhar, Mahesh A, MD  Multiple Vitamin (MULTIVITAMIN WITH MINERALS) TABS tablet Take 1 tablet by mouth daily.   Yes [provider]  promethazine -dextromethorphan (PROMETHAZINE -DM) 6.25-15 MG/5ML syrup 1-2 teaspoons every 6 hours as needed for cough mainly at bedtime. Do not take and drive. 02/04/24  Yes [provider]  rosuvastatin  (CRESTOR ) 5  MG tablet TAKE 1 TABLET(5 MG) BY MOUTH 2 TIMES A WEEK 01/24/24  Yes Chandrasekhar, Mahesh A, MD  vitamin E 1000 UNIT capsule Take 1,000 Units by mouth daily.   Yes [provider]   Social History   Socioeconomic History   Marital status: Married    Spouse name: Not on file   Number of children: Not on file   Years of education: Not on file   Highest education level: Not on file  Occupational History   Not on file  Tobacco Use   Smoking status: Never   Smokeless tobacco: Never  Vaping Use   Vaping status: Never Used  Substance and Sexual Activity   Alcohol use: No   Drug use: No   Sexual activity: Not Currently    Partners: Male  Other Topics Concern   Not on file  Social History Narrative   Married, 4 children (daughter Charisma patient here, husband Bretta, son Bretta). 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      Hobbies: time with family, watching news, reading      Right handed         Social Drivers of Health   Tobacco Use: Low Risk (02/19/2024)   Patient History    Smoking Tobacco Use: Never    Smokeless Tobacco Use: Never    Passive Exposure: Not on file  Financial Resource Strain: Not on file  Food Insecurity: Not on file  Transportation Needs: Not on file  Physical Activity: Not on file  Stress: Not on file  Social Connections: Not on file  Intimate Partner Violence: Not on file  Depression (PHQ2-9): Medium Risk (10/10/2023)   Depression (PHQ2-9)    PHQ-2 Score: 6  Alcohol Screen: Not on file  Housing: Not on file  Utilities: Not on file  Health Literacy: Not on file    Observations/Objective: Nontoxic appearance on video, speaking in full sentences without respiratory distress.  No audible wheeze.  Coughing few times during visit but overall normal respiratory effort.  All questions were answered with  understanding of plan expressed.  Assessment and Plan: Subacute cough - Plan: DG Chest 2 View, promethazine -dextromethorphan (PROMETHAZINE -DM) 6.25-15 MG/5ML syrup, benzonatate  (TESSALON ) 100 MG capsule Subacute cough with some improvement then recurrence.  Has been treated with antibiotic as above, denies fever or dyspnea other than during coughing fit.  No wheezing, no history of asthma, no tobacco history.  Given her persistent, intermittent recurrence of cough I think a chest x-ray would be reasonable but we will hold on further antibiotics at this time depending on those results.  To treat cough, we discussed use of Phenergan  DM at nighttime as that has been somewhat helpful along with Tessalon  Perles during the day.  Mucinex  is fine as well.  RTC/ER precautions were given.  Follow Up Instructions:  As needed with RTC/ER precautions.  I discussed the assessment and treatment plan with the patient. The patient was provided an opportunity to ask questions and all were answered. The patient agreed with the plan and demonstrated an understanding of the instructions.   The patient was advised to call back or seek an in-person evaluation if the symptoms worsen or if the condition fails to improve as anticipated.   Reyes JONELLE Pines, MD     [1]  Allergies Allergen Reactions   Codeine Nausea Only   Penicillins Nausea And Vomiting, Swelling and Other (See Comments)    Has patient had a PCN reaction causing immediate rash, facial/tongue/throat swelling, SOB or lightheadedness with hypotension: Yes Has patient had a PCN reaction causing severe rash involving mucus membranes or skin necrosis: No Has patient had a PCN reaction that required hospitalization: No Has patient had a PCN reaction occurring within the last 10 years: No If all of the above answers are NO, then may proceed with Cephalosporin use.    Latex Rash

## 2024-05-01 ENCOUNTER — Encounter: Admitting: Family Medicine

## 2024-07-18 ENCOUNTER — Ambulatory Visit: Admitting: Neurology

## 2024-07-26 ENCOUNTER — Ambulatory Visit: Admitting: Neurology

## 2024-09-04 ENCOUNTER — Ambulatory Visit: Admitting: Neurology
